# Patient Record
Sex: Male | Born: 1935
Health system: Southern US, Community
[De-identification: ages and names within clinical notes are randomized; demographics above are authoritative.]

## PROBLEM LIST (undated history)

## (undated) DIAGNOSIS — Z860101 Personal history of adenomatous and serrated colon polyps: Secondary | ICD-10-CM

## (undated) DIAGNOSIS — R339 Retention of urine, unspecified: Secondary | ICD-10-CM

## (undated) DIAGNOSIS — I2510 Atherosclerotic heart disease of native coronary artery without angina pectoris: Secondary | ICD-10-CM

## (undated) DIAGNOSIS — Z86010 Personal history of colonic polyps: Secondary | ICD-10-CM

## (undated) DIAGNOSIS — E785 Hyperlipidemia, unspecified: Secondary | ICD-10-CM

## (undated) DIAGNOSIS — R972 Elevated prostate specific antigen [PSA]: Secondary | ICD-10-CM

## (undated) DIAGNOSIS — I10 Essential (primary) hypertension: Secondary | ICD-10-CM

## (undated) DIAGNOSIS — M1990 Unspecified osteoarthritis, unspecified site: Secondary | ICD-10-CM

## (undated) DIAGNOSIS — I5030 Unspecified diastolic (congestive) heart failure: Secondary | ICD-10-CM

## (undated) DIAGNOSIS — K219 Gastro-esophageal reflux disease without esophagitis: Secondary | ICD-10-CM

## (undated) DIAGNOSIS — C61 Malignant neoplasm of prostate: Secondary | ICD-10-CM

## (undated) HISTORY — DX: Personal history of colonic polyps: Z86.010

## (undated) HISTORY — DX: Unspecified osteoarthritis, unspecified site: M19.90

## (undated) HISTORY — DX: Hyperlipidemia, unspecified: E78.5

## (undated) HISTORY — DX: Personal history of adenomatous and serrated colon polyps: Z86.0101

## (undated) HISTORY — DX: Malignant neoplasm of prostate: C61

## (undated) HISTORY — DX: Gastro-esophageal reflux disease without esophagitis: K21.9

## (undated) HISTORY — PX: CORONARY ARTERY BYPASS GRAFT: SHX141

## (undated) HISTORY — DX: Atherosclerotic heart disease of native coronary artery without angina pectoris: I25.10

## (undated) HISTORY — DX: Essential (primary) hypertension: I10

## (undated) HISTORY — DX: Unspecified diastolic (congestive) heart failure: I50.30

## (undated) HISTORY — DX: Elevated prostate specific antigen (PSA): R97.20

---

## 1994-03-02 ENCOUNTER — Encounter: Payer: Self-pay | Admitting: Internal Medicine

## 1996-12-24 ENCOUNTER — Encounter: Payer: Self-pay | Admitting: Gastroenterology

## 1997-01-08 ENCOUNTER — Encounter: Payer: Self-pay | Admitting: Internal Medicine

## 1997-01-24 ENCOUNTER — Encounter: Payer: Self-pay | Admitting: Gastroenterology

## 1999-12-24 ENCOUNTER — Encounter: Payer: Self-pay | Admitting: Gastroenterology

## 1999-12-27 ENCOUNTER — Encounter: Payer: Self-pay | Admitting: Internal Medicine

## 1999-12-27 ENCOUNTER — Encounter: Payer: Self-pay | Admitting: Gastroenterology

## 1999-12-27 ENCOUNTER — Encounter (INDEPENDENT_AMBULATORY_CARE_PROVIDER_SITE_OTHER): Payer: Self-pay | Admitting: Specialist

## 1999-12-27 ENCOUNTER — Other Ambulatory Visit: Admission: RE | Admit: 1999-12-27 | Discharge: 1999-12-27 | Payer: Self-pay | Admitting: Gastroenterology

## 2000-01-03 ENCOUNTER — Ambulatory Visit (HOSPITAL_COMMUNITY): Admission: RE | Admit: 2000-01-03 | Discharge: 2000-01-03 | Payer: Self-pay | Admitting: Gastroenterology

## 2000-01-03 ENCOUNTER — Encounter (INDEPENDENT_AMBULATORY_CARE_PROVIDER_SITE_OTHER): Payer: Self-pay | Admitting: *Deleted

## 2000-01-03 ENCOUNTER — Encounter: Payer: Self-pay | Admitting: Gastroenterology

## 2000-01-14 ENCOUNTER — Encounter: Payer: Self-pay | Admitting: Internal Medicine

## 2000-02-21 ENCOUNTER — Encounter: Payer: Self-pay | Admitting: Internal Medicine

## 2000-03-09 ENCOUNTER — Ambulatory Visit (HOSPITAL_COMMUNITY): Admission: RE | Admit: 2000-03-09 | Discharge: 2000-03-09 | Payer: Self-pay | Admitting: Internal Medicine

## 2000-03-09 ENCOUNTER — Encounter: Payer: Self-pay | Admitting: Internal Medicine

## 2000-03-09 ENCOUNTER — Encounter (INDEPENDENT_AMBULATORY_CARE_PROVIDER_SITE_OTHER): Payer: Self-pay | Admitting: Specialist

## 2000-04-19 ENCOUNTER — Ambulatory Visit (HOSPITAL_COMMUNITY): Admission: RE | Admit: 2000-04-19 | Discharge: 2000-04-19 | Payer: Self-pay | Admitting: Internal Medicine

## 2000-04-19 ENCOUNTER — Encounter: Payer: Self-pay | Admitting: Internal Medicine

## 2000-04-19 ENCOUNTER — Encounter (INDEPENDENT_AMBULATORY_CARE_PROVIDER_SITE_OTHER): Payer: Self-pay | Admitting: Specialist

## 2000-08-03 ENCOUNTER — Encounter: Payer: Self-pay | Admitting: Internal Medicine

## 2000-08-03 ENCOUNTER — Ambulatory Visit (HOSPITAL_COMMUNITY): Admission: RE | Admit: 2000-08-03 | Discharge: 2000-08-03 | Payer: Self-pay | Admitting: Internal Medicine

## 2000-08-03 ENCOUNTER — Encounter (INDEPENDENT_AMBULATORY_CARE_PROVIDER_SITE_OTHER): Payer: Self-pay | Admitting: Specialist

## 2000-10-23 ENCOUNTER — Encounter: Payer: Self-pay | Admitting: Internal Medicine

## 2000-10-23 ENCOUNTER — Ambulatory Visit (HOSPITAL_COMMUNITY): Admission: RE | Admit: 2000-10-23 | Discharge: 2000-10-23 | Payer: Self-pay | Admitting: Internal Medicine

## 2002-05-17 ENCOUNTER — Encounter: Payer: Self-pay | Admitting: Internal Medicine

## 2002-06-04 ENCOUNTER — Encounter: Payer: Self-pay | Admitting: Cardiology

## 2002-06-05 ENCOUNTER — Encounter: Payer: Self-pay | Admitting: Cardiothoracic Surgery

## 2002-06-05 ENCOUNTER — Inpatient Hospital Stay (HOSPITAL_COMMUNITY): Admission: RE | Admit: 2002-06-05 | Discharge: 2002-06-10 | Payer: Self-pay | Admitting: Cardiology

## 2002-06-06 ENCOUNTER — Encounter: Payer: Self-pay | Admitting: Cardiothoracic Surgery

## 2002-06-07 ENCOUNTER — Encounter: Payer: Self-pay | Admitting: Cardiothoracic Surgery

## 2002-06-08 ENCOUNTER — Encounter: Payer: Self-pay | Admitting: Cardiothoracic Surgery

## 2002-06-09 ENCOUNTER — Encounter (INDEPENDENT_AMBULATORY_CARE_PROVIDER_SITE_OTHER): Payer: Self-pay | Admitting: *Deleted

## 2002-07-15 ENCOUNTER — Encounter (HOSPITAL_COMMUNITY): Admission: RE | Admit: 2002-07-15 | Discharge: 2002-10-13 | Payer: Self-pay | Admitting: Cardiology

## 2002-07-23 ENCOUNTER — Encounter: Payer: Self-pay | Admitting: Internal Medicine

## 2002-08-13 ENCOUNTER — Encounter: Payer: Self-pay | Admitting: Internal Medicine

## 2002-08-13 LAB — HM COLONOSCOPY

## 2004-07-11 ENCOUNTER — Observation Stay (HOSPITAL_COMMUNITY): Admission: EM | Admit: 2004-07-11 | Discharge: 2004-07-12 | Payer: Self-pay | Admitting: Emergency Medicine

## 2004-08-31 ENCOUNTER — Ambulatory Visit: Payer: Self-pay | Admitting: Internal Medicine

## 2004-09-01 ENCOUNTER — Ambulatory Visit: Payer: Self-pay | Admitting: Internal Medicine

## 2004-09-21 ENCOUNTER — Ambulatory Visit: Payer: Self-pay | Admitting: Internal Medicine

## 2004-10-12 ENCOUNTER — Ambulatory Visit: Payer: Self-pay | Admitting: Internal Medicine

## 2005-01-11 ENCOUNTER — Ambulatory Visit: Payer: Self-pay | Admitting: Internal Medicine

## 2005-06-23 ENCOUNTER — Ambulatory Visit: Payer: Self-pay | Admitting: Internal Medicine

## 2005-07-06 ENCOUNTER — Emergency Department (HOSPITAL_COMMUNITY): Admission: EM | Admit: 2005-07-06 | Discharge: 2005-07-06 | Payer: Self-pay | Admitting: Emergency Medicine

## 2005-07-12 ENCOUNTER — Ambulatory Visit: Payer: Self-pay | Admitting: Internal Medicine

## 2005-07-13 ENCOUNTER — Encounter: Admission: RE | Admit: 2005-07-13 | Discharge: 2005-07-13 | Payer: Self-pay | Admitting: Internal Medicine

## 2005-07-19 ENCOUNTER — Ambulatory Visit: Payer: Self-pay

## 2005-07-26 ENCOUNTER — Ambulatory Visit: Payer: Self-pay | Admitting: Cardiology

## 2005-08-02 ENCOUNTER — Ambulatory Visit: Payer: Self-pay | Admitting: Internal Medicine

## 2005-08-05 ENCOUNTER — Ambulatory Visit: Payer: Self-pay | Admitting: Internal Medicine

## 2005-10-04 ENCOUNTER — Ambulatory Visit: Payer: Self-pay | Admitting: Internal Medicine

## 2005-11-30 ENCOUNTER — Ambulatory Visit: Payer: Self-pay | Admitting: Internal Medicine

## 2005-12-07 ENCOUNTER — Ambulatory Visit: Payer: Self-pay | Admitting: Internal Medicine

## 2006-03-07 ENCOUNTER — Ambulatory Visit: Payer: Self-pay | Admitting: Internal Medicine

## 2006-04-07 ENCOUNTER — Emergency Department (HOSPITAL_COMMUNITY): Admission: EM | Admit: 2006-04-07 | Discharge: 2006-04-07 | Payer: Self-pay | Admitting: Emergency Medicine

## 2006-07-26 ENCOUNTER — Ambulatory Visit: Payer: Self-pay | Admitting: Cardiology

## 2006-08-02 ENCOUNTER — Ambulatory Visit: Payer: Self-pay | Admitting: Cardiology

## 2006-08-29 ENCOUNTER — Ambulatory Visit: Payer: Self-pay | Admitting: Internal Medicine

## 2006-08-29 LAB — CONVERTED CEMR LAB: Hgb A1c MFr Bld: 7.9 % — ABNORMAL HIGH (ref 4.6–6.0)

## 2006-11-28 ENCOUNTER — Ambulatory Visit: Payer: Self-pay | Admitting: Internal Medicine

## 2007-01-25 ENCOUNTER — Emergency Department (HOSPITAL_COMMUNITY): Admission: EM | Admit: 2007-01-25 | Discharge: 2007-01-26 | Payer: Self-pay | Admitting: Emergency Medicine

## 2007-03-13 ENCOUNTER — Emergency Department (HOSPITAL_COMMUNITY): Admission: EM | Admit: 2007-03-13 | Discharge: 2007-03-14 | Payer: Self-pay | Admitting: Emergency Medicine

## 2007-06-04 DIAGNOSIS — E785 Hyperlipidemia, unspecified: Secondary | ICD-10-CM

## 2007-06-04 DIAGNOSIS — E1159 Type 2 diabetes mellitus with other circulatory complications: Secondary | ICD-10-CM | POA: Insufficient documentation

## 2007-06-04 DIAGNOSIS — E119 Type 2 diabetes mellitus without complications: Secondary | ICD-10-CM | POA: Insufficient documentation

## 2007-06-04 DIAGNOSIS — E1169 Type 2 diabetes mellitus with other specified complication: Secondary | ICD-10-CM | POA: Insufficient documentation

## 2007-06-04 DIAGNOSIS — I10 Essential (primary) hypertension: Secondary | ICD-10-CM

## 2007-06-04 DIAGNOSIS — I2510 Atherosclerotic heart disease of native coronary artery without angina pectoris: Secondary | ICD-10-CM | POA: Insufficient documentation

## 2007-06-05 ENCOUNTER — Ambulatory Visit: Payer: Self-pay | Admitting: Internal Medicine

## 2007-06-05 LAB — CONVERTED CEMR LAB
ALT: 19 units/L (ref 0–53)
AST: 16 units/L (ref 0–37)
Albumin: 3.6 g/dL (ref 3.5–5.2)
Alkaline Phosphatase: 38 units/L — ABNORMAL LOW (ref 39–117)
BUN: 19 mg/dL (ref 6–23)
Basophils Absolute: 0 10*3/uL (ref 0.0–0.1)
Basophils Relative: 0.4 % (ref 0.0–1.0)
Bilirubin Urine: NEGATIVE
Bilirubin, Direct: 0.1 mg/dL (ref 0.0–0.3)
Blood in Urine, dipstick: NEGATIVE
CO2: 30 meq/L (ref 19–32)
Calcium: 9 mg/dL (ref 8.4–10.5)
Chloride: 109 meq/L (ref 96–112)
Cholesterol: 130 mg/dL (ref 0–200)
Creatinine, Ser: 0.9 mg/dL (ref 0.4–1.5)
Creatinine,U: 99.6 mg/dL
Eosinophils Absolute: 0.2 10*3/uL (ref 0.0–0.6)
Eosinophils Relative: 2.3 % (ref 0.0–5.0)
GFR calc Af Amer: 103 mL/min
GFR calc non Af Amer: 85 mL/min
Glucose, Bld: 134 mg/dL — ABNORMAL HIGH (ref 70–99)
Glucose, Urine, Semiquant: NEGATIVE
HCT: 38.2 % — ABNORMAL LOW (ref 39.0–52.0)
HDL: 40.4 mg/dL (ref >39.0)
Hemoglobin: 13.2 g/dL (ref 13.0–17.0)
Hgb A1c MFr Bld: 7.4 % — ABNORMAL HIGH (ref 4.6–6.0)
Ketones, urine, test strip: NEGATIVE
LDL Cholesterol: 72 mg/dL (ref 0–99)
Lymphocytes Relative: 35.5 % (ref 12.0–46.0)
MCHC: 34.5 g/dL (ref 30.0–36.0)
MCV: 91.4 fL (ref 78.0–100.0)
Microalb Creat Ratio: 7 mg/g (ref 0.0–30.0)
Microalb, Ur: 0.7 mg/dL (ref 0.0–1.9)
Monocytes Absolute: 0.5 10*3/uL (ref 0.2–0.7)
Monocytes Relative: 7.5 % (ref 3.0–11.0)
Neutro Abs: 3.8 10*3/uL (ref 1.4–7.7)
Neutrophils Relative %: 54.3 % (ref 43.0–77.0)
Nitrite: NEGATIVE
Platelets: 311 10*3/uL (ref 150–400)
Potassium: 4.3 meq/L (ref 3.5–5.1)
Protein, U semiquant: NEGATIVE
RBC: 4.18 M/uL — ABNORMAL LOW (ref 4.22–5.81)
RDW: 13 % (ref 11.5–14.6)
Sodium: 144 meq/L (ref 135–145)
Specific Gravity, Urine: 1.02
TSH: 1.23 microintl units/mL (ref 0.35–5.50)
Total Bilirubin: 0.8 mg/dL (ref 0.3–1.2)
Total CHOL/HDL Ratio: 3.2
Total Protein: 6.6 g/dL (ref 6.0–8.3)
Triglycerides: 89 mg/dL (ref 0–149)
Urobilinogen, UA: 0.2
VLDL: 18 mg/dL (ref 0–40)
WBC Urine, dipstick: NEGATIVE
WBC: 7 10*3/uL (ref 4.5–10.5)
pH: 6

## 2007-06-12 ENCOUNTER — Ambulatory Visit: Payer: Self-pay | Admitting: Internal Medicine

## 2007-07-27 ENCOUNTER — Ambulatory Visit: Payer: Self-pay | Admitting: Internal Medicine

## 2007-08-08 ENCOUNTER — Ambulatory Visit: Payer: Self-pay | Admitting: Internal Medicine

## 2007-08-08 LAB — CONVERTED CEMR LAB: Hgb A1c MFr Bld: 7.4 % — ABNORMAL HIGH (ref 4.6–6.0)

## 2007-08-15 ENCOUNTER — Ambulatory Visit: Payer: Self-pay | Admitting: Internal Medicine

## 2007-08-28 ENCOUNTER — Ambulatory Visit: Payer: Self-pay | Admitting: Cardiology

## 2007-11-14 ENCOUNTER — Ambulatory Visit: Payer: Self-pay | Admitting: Internal Medicine

## 2007-11-14 LAB — CONVERTED CEMR LAB
BUN: 19 mg/dL (ref 6–23)
CO2: 28 meq/L (ref 19–32)
Calcium: 9.8 mg/dL (ref 8.4–10.5)
Chloride: 105 meq/L (ref 96–112)
Creatinine, Ser: 1.2 mg/dL (ref 0.4–1.5)
GFR calc Af Amer: 74 mL/min
GFR calc non Af Amer: 61 mL/min
Glucose, Bld: 103 mg/dL — ABNORMAL HIGH (ref 70–99)
Hgb A1c MFr Bld: 7.8 % — ABNORMAL HIGH (ref 4.6–6.0)
Potassium: 5.1 meq/L (ref 3.5–5.1)
Sodium: 140 meq/L (ref 135–145)

## 2007-11-29 ENCOUNTER — Encounter (INDEPENDENT_AMBULATORY_CARE_PROVIDER_SITE_OTHER): Payer: Self-pay | Admitting: Orthopedic Surgery

## 2007-11-29 ENCOUNTER — Ambulatory Visit (HOSPITAL_COMMUNITY): Admission: RE | Admit: 2007-11-29 | Discharge: 2007-11-29 | Payer: Self-pay | Admitting: Orthopedic Surgery

## 2007-12-04 ENCOUNTER — Encounter: Payer: Self-pay | Admitting: Internal Medicine

## 2008-02-13 ENCOUNTER — Ambulatory Visit: Payer: Self-pay | Admitting: Internal Medicine

## 2008-02-13 DIAGNOSIS — K449 Diaphragmatic hernia without obstruction or gangrene: Secondary | ICD-10-CM | POA: Insufficient documentation

## 2008-02-13 DIAGNOSIS — M5030 Other cervical disc degeneration, unspecified cervical region: Secondary | ICD-10-CM | POA: Insufficient documentation

## 2008-02-21 ENCOUNTER — Encounter: Payer: Self-pay | Admitting: Internal Medicine

## 2008-03-04 ENCOUNTER — Encounter: Payer: Self-pay | Admitting: Internal Medicine

## 2008-03-20 ENCOUNTER — Telehealth (INDEPENDENT_AMBULATORY_CARE_PROVIDER_SITE_OTHER): Payer: Self-pay | Admitting: *Deleted

## 2008-07-14 ENCOUNTER — Ambulatory Visit (HOSPITAL_COMMUNITY): Admission: RE | Admit: 2008-07-14 | Discharge: 2008-07-14 | Payer: Self-pay | Admitting: Urology

## 2008-08-21 ENCOUNTER — Ambulatory Visit: Payer: Self-pay | Admitting: Internal Medicine

## 2008-08-21 DIAGNOSIS — C61 Malignant neoplasm of prostate: Secondary | ICD-10-CM | POA: Insufficient documentation

## 2008-08-21 DIAGNOSIS — K429 Umbilical hernia without obstruction or gangrene: Secondary | ICD-10-CM | POA: Insufficient documentation

## 2008-08-21 LAB — CONVERTED CEMR LAB
BUN: 25 mg/dL — ABNORMAL HIGH (ref 6–23)
CO2: 29 meq/L (ref 19–32)
Calcium: 9.5 mg/dL (ref 8.4–10.5)
Chloride: 106 meq/L (ref 96–112)
Cholesterol: 133 mg/dL (ref 0–200)
Creatinine, Ser: 1.1 mg/dL (ref 0.4–1.5)
GFR calc Af Amer: 82 mL/min
GFR calc non Af Amer: 68 mL/min
Glucose, Bld: 135 mg/dL — ABNORMAL HIGH (ref 70–99)
HDL: 43.7 mg/dL (ref >39.0)
Hgb A1c MFr Bld: 8 % — ABNORMAL HIGH (ref 4.6–6.0)
LDL Cholesterol: 62 mg/dL (ref 0–99)
Potassium: 4.8 meq/L (ref 3.5–5.1)
Sodium: 142 meq/L (ref 135–145)
Total CHOL/HDL Ratio: 3
Triglycerides: 136 mg/dL (ref 0–149)
VLDL: 27 mg/dL (ref 0–40)

## 2008-08-28 ENCOUNTER — Ambulatory Visit: Payer: Self-pay | Admitting: Cardiology

## 2008-10-14 ENCOUNTER — Telehealth: Payer: Self-pay | Admitting: Internal Medicine

## 2009-03-05 ENCOUNTER — Telehealth: Payer: Self-pay | Admitting: Internal Medicine

## 2009-05-12 ENCOUNTER — Ambulatory Visit: Payer: Self-pay | Admitting: Internal Medicine

## 2009-05-12 LAB — CONVERTED CEMR LAB
Basophils Absolute: 0 10*3/uL (ref 0.0–0.1)
Basophils Relative: 0.3 % (ref 0.0–3.0)
Cholesterol: 128 mg/dL (ref 0–200)
Direct LDL: 71.9 mg/dL
Eosinophils Absolute: 0.1 10*3/uL (ref 0.0–0.7)
Eosinophils Relative: 1.6 % (ref 0.0–5.0)
HCT: 38.9 % — ABNORMAL LOW (ref 39.0–52.0)
HDL: 42.7 mg/dL (ref >39.00)
Hemoglobin: 13.3 g/dL (ref 13.0–17.0)
Hgb A1c MFr Bld: 7.6 % — ABNORMAL HIGH (ref 4.6–6.5)
Lymphocytes Relative: 24.6 % (ref 12.0–46.0)
Lymphs Abs: 1.6 10*3/uL (ref 0.7–4.0)
MCHC: 34.1 g/dL (ref 30.0–36.0)
MCV: 95.8 fL (ref 78.0–100.0)
Monocytes Absolute: 0.6 10*3/uL (ref 0.1–1.0)
Monocytes Relative: 8.6 % (ref 3.0–12.0)
Neutro Abs: 4.4 10*3/uL (ref 1.4–7.7)
Neutrophils Relative %: 64.9 % (ref 43.0–77.0)
Platelets: 276 10*3/uL (ref 150.0–400.0)
RBC: 4.06 M/uL — ABNORMAL LOW (ref 4.22–5.81)
RDW: 12.9 % (ref 11.5–14.6)
WBC: 6.7 10*3/uL (ref 4.5–10.5)

## 2009-05-28 ENCOUNTER — Telehealth: Payer: Self-pay | Admitting: *Deleted

## 2009-07-30 ENCOUNTER — Encounter: Payer: Self-pay | Admitting: Internal Medicine

## 2009-08-04 ENCOUNTER — Ambulatory Visit: Payer: Self-pay | Admitting: Internal Medicine

## 2009-08-04 DIAGNOSIS — K3189 Other diseases of stomach and duodenum: Secondary | ICD-10-CM | POA: Insufficient documentation

## 2009-08-04 DIAGNOSIS — K219 Gastro-esophageal reflux disease without esophagitis: Secondary | ICD-10-CM | POA: Insufficient documentation

## 2009-08-04 DIAGNOSIS — R1013 Epigastric pain: Secondary | ICD-10-CM

## 2009-08-20 ENCOUNTER — Ambulatory Visit: Payer: Self-pay | Admitting: Internal Medicine

## 2009-09-01 ENCOUNTER — Ambulatory Visit: Payer: Self-pay | Admitting: Cardiology

## 2009-09-01 DIAGNOSIS — R079 Chest pain, unspecified: Secondary | ICD-10-CM | POA: Insufficient documentation

## 2009-09-15 ENCOUNTER — Ambulatory Visit: Payer: Self-pay | Admitting: Internal Medicine

## 2009-09-15 LAB — CONVERTED CEMR LAB
ALT: 10 units/L (ref 0–53)
AST: 15 units/L (ref 0–37)
Albumin: 4 g/dL (ref 3.5–5.2)
Alkaline Phosphatase: 39 units/L (ref 39–117)
BUN: 16 mg/dL (ref 6–23)
Bilirubin, Direct: 0 mg/dL (ref 0.0–0.3)
CO2: 29 meq/L (ref 19–32)
Calcium: 9.4 mg/dL (ref 8.4–10.5)
Chloride: 109 meq/L (ref 96–112)
Cholesterol: 155 mg/dL (ref 0–200)
Creatinine, Ser: 1 mg/dL (ref 0.4–1.5)
GFR calc non Af Amer: 90.93 mL/min (ref >60)
Glucose, Bld: 122 mg/dL — ABNORMAL HIGH (ref 70–99)
HDL: 48.4 mg/dL (ref >39.00)
Hgb A1c MFr Bld: 7.5 % — ABNORMAL HIGH (ref 4.6–6.5)
LDL Cholesterol: 89 mg/dL (ref 0–99)
Potassium: 5 meq/L (ref 3.5–5.1)
Sodium: 143 meq/L (ref 135–145)
TSH: 1.45 microintl units/mL (ref 0.35–5.50)
Total Bilirubin: 1 mg/dL (ref 0.3–1.2)
Total CHOL/HDL Ratio: 3
Total Protein: 7.2 g/dL (ref 6.0–8.3)
Triglycerides: 89 mg/dL (ref 0.0–149.0)
VLDL: 17.8 mg/dL (ref 0.0–40.0)

## 2010-01-14 ENCOUNTER — Ambulatory Visit: Payer: Self-pay | Admitting: Internal Medicine

## 2010-01-14 LAB — CONVERTED CEMR LAB
BUN: 28 mg/dL — ABNORMAL HIGH (ref 6–23)
Basophils Absolute: 0 10*3/uL (ref 0.0–0.1)
Basophils Relative: 0.2 % (ref 0.0–3.0)
CO2: 28 meq/L (ref 19–32)
Calcium: 8.9 mg/dL (ref 8.4–10.5)
Chloride: 104 meq/L (ref 96–112)
Cholesterol, target level: 200 mg/dL
Creatinine, Ser: 1.2 mg/dL (ref 0.4–1.5)
Eosinophils Absolute: 0.1 10*3/uL (ref 0.0–0.7)
Eosinophils Relative: 2 % (ref 0.0–5.0)
GFR calc non Af Amer: 73.62 mL/min (ref >60)
Glucose, Bld: 202 mg/dL — ABNORMAL HIGH (ref 70–99)
HCT: 35.5 % — ABNORMAL LOW (ref 39.0–52.0)
HDL goal, serum: 40 mg/dL
Hemoglobin: 11.8 g/dL — ABNORMAL LOW (ref 13.0–17.0)
Hgb A1c MFr Bld: 7.3 % — ABNORMAL HIGH (ref 4.6–6.5)
LDL Goal: 70 mg/dL
Lymphocytes Relative: 25.6 % (ref 12.0–46.0)
Lymphs Abs: 1.6 10*3/uL (ref 0.7–4.0)
MCHC: 33.3 g/dL (ref 30.0–36.0)
MCV: 98.7 fL (ref 78.0–100.0)
Monocytes Absolute: 0.6 10*3/uL (ref 0.1–1.0)
Monocytes Relative: 8.8 % (ref 3.0–12.0)
Neutro Abs: 4 10*3/uL (ref 1.4–7.7)
Neutrophils Relative %: 63.4 % (ref 43.0–77.0)
Platelets: 280 10*3/uL (ref 150.0–400.0)
Potassium: 4.2 meq/L (ref 3.5–5.1)
RBC: 3.6 M/uL — ABNORMAL LOW (ref 4.22–5.81)
RDW: 12.5 % (ref 11.5–14.6)
Sodium: 139 meq/L (ref 135–145)
WBC: 6.3 10*3/uL (ref 4.5–10.5)

## 2010-02-17 ENCOUNTER — Encounter: Payer: Self-pay | Admitting: Internal Medicine

## 2010-03-09 ENCOUNTER — Telehealth: Payer: Self-pay | Admitting: Internal Medicine

## 2010-03-10 ENCOUNTER — Encounter: Payer: Self-pay | Admitting: Internal Medicine

## 2010-06-16 ENCOUNTER — Ambulatory Visit: Payer: Self-pay | Admitting: Internal Medicine

## 2010-06-16 ENCOUNTER — Telehealth: Payer: Self-pay | Admitting: Internal Medicine

## 2010-06-16 DIAGNOSIS — K5900 Constipation, unspecified: Secondary | ICD-10-CM | POA: Insufficient documentation

## 2010-07-29 ENCOUNTER — Ambulatory Visit: Payer: Self-pay | Admitting: Internal Medicine

## 2010-07-29 LAB — CONVERTED CEMR LAB
ALT: 13 units/L (ref 0–53)
AST: 16 units/L (ref 0–37)
Albumin: 3.7 g/dL (ref 3.5–5.2)
Alkaline Phosphatase: 44 units/L (ref 39–117)
BUN: 29 mg/dL — ABNORMAL HIGH (ref 6–23)
Basophils Absolute: 0 10*3/uL (ref 0.0–0.1)
Basophils Relative: 0.6 % (ref 0.0–3.0)
Bilirubin, Direct: 0.1 mg/dL (ref 0.0–0.3)
CO2: 28 meq/L (ref 19–32)
Calcium: 9.6 mg/dL (ref 8.4–10.5)
Chloride: 104 meq/L (ref 96–112)
Cholesterol: 123 mg/dL (ref 0–200)
Creatinine, Ser: 1.2 mg/dL (ref 0.4–1.5)
Eosinophils Absolute: 0.1 10*3/uL (ref 0.0–0.7)
Eosinophils Relative: 2.2 % (ref 0.0–5.0)
GFR calc non Af Amer: 76.46 mL/min (ref >60)
Glucose, Bld: 102 mg/dL — ABNORMAL HIGH (ref 70–99)
HCT: 34.8 % — ABNORMAL LOW (ref 39.0–52.0)
HDL: 40.4 mg/dL (ref >39.00)
Hemoglobin: 11.5 g/dL — ABNORMAL LOW (ref 13.0–17.0)
LDL Cholesterol: 61 mg/dL (ref 0–99)
Lymphocytes Relative: 27 % (ref 12.0–46.0)
Lymphs Abs: 1.8 10*3/uL (ref 0.7–4.0)
MCHC: 33.1 g/dL (ref 30.0–36.0)
MCV: 89.7 fL (ref 78.0–100.0)
Monocytes Absolute: 0.6 10*3/uL (ref 0.1–1.0)
Monocytes Relative: 9.4 % (ref 3.0–12.0)
Neutro Abs: 4.1 10*3/uL (ref 1.4–7.7)
Neutrophils Relative %: 60.8 % (ref 43.0–77.0)
PSA: 8.14 ng/mL — ABNORMAL HIGH (ref 0.10–4.00)
Platelets: 324 10*3/uL (ref 150.0–400.0)
Potassium: 4.9 meq/L (ref 3.5–5.1)
RBC: 3.88 M/uL — ABNORMAL LOW (ref 4.22–5.81)
RDW: 14.9 % — ABNORMAL HIGH (ref 11.5–14.6)
Sodium: 138 meq/L (ref 135–145)
TSH: 0.97 microintl units/mL (ref 0.35–5.50)
Total Bilirubin: 0.7 mg/dL (ref 0.3–1.2)
Total CHOL/HDL Ratio: 3
Total Protein: 6.7 g/dL (ref 6.0–8.3)
Triglycerides: 109 mg/dL (ref 0.0–149.0)
VLDL: 21.8 mg/dL (ref 0.0–40.0)
WBC: 6.7 10*3/uL (ref 4.5–10.5)

## 2010-07-29 LAB — HM DIABETES FOOT EXAM

## 2010-10-13 ENCOUNTER — Telehealth: Payer: Self-pay | Admitting: Internal Medicine

## 2010-11-22 ENCOUNTER — Ambulatory Visit: Admit: 2010-11-22 | Payer: Self-pay | Admitting: Cardiology

## 2010-11-25 NOTE — Progress Notes (Signed)
Summary: [facility]   Imported By: Sherian Rein 08/05/2009 07:39:17  _____________________________________________________________________  External Attachment:    Type:   Image     Comment:   External Document

## 2010-11-25 NOTE — Progress Notes (Signed)
Summary: [facility]  GI Office Note/[facility]   Imported By: Sherian Rein 08/05/2009 07:54:13  _____________________________________________________________________  External Attachment:    Type:   Image     Comment:   External Document

## 2010-11-25 NOTE — Assessment & Plan Note (Signed)
Summary: 3 month f/u//ca   Vital Signs:  Patient Profile:   77 Years Old Male Height:     70 inches Weight:      206 pounds Temp:     98.2 degrees F oral Pulse rate:   14 / minute Resp:     14 per minute BP sitting:   120 / 72  (left arm)  Vitals Entered By: Willy Eddy, LPN (February 13, 2008 9:30 AM)                 Chief Complaint:  roa.  History of Present Illness: Current Problems:  Blood pressure follow up and review of medicatons DIABETES MELLITUS, TYPE II (ICD-250.00)  stable with good cbgs CORONARY ARTERY DISEASE (ICD-414.00) PSA, INCREASED (ICD-790.93) HYPERTENSION (ICD-401.9) stable HYPERLIPIDEMIA (ICD-272.4)    Diabetes Management History:      The patient is an 77 years old male who comes in for evaluation of DM Type 2.  He has not been enrolled in the "Diabetic Education Program".  He states understanding of dietary principles and is following his diet appropriately.  Sensory loss is noted.  Self foot exams are being performed.  He is checking home blood sugars.  He says that he is not exercising regularly.        Hypoglycemic symptoms are not occurring.  No hyperglycemic symptoms are reported.        His home fasting blood sugars are as follows: average: 110.    Hypertension History:      He denies headache, chest pain, palpitations, dyspnea with exertion, orthopnea, PND, peripheral edema, visual symptoms, neurologic problems, syncope, and side effects from treatment.        Positive major cardiovascular risk factors include male age 19 years old or older, diabetes, hyperlipidemia, and hypertension.  Negative major cardiovascular risk factors include non-tobacco-user status.        Positive history for target organ damage include ASHD (either angina; prior MI; prior CABG).        Current Allergies: No known allergies   Past Medical History:    Reviewed history from 06/04/2007 and no changes required:       Hyperlipidemia       Hypertension   elevated PSA, hx of       Coronary artery disease       Diabetes mellitus, type II   Social History:    Reviewed history from 06/12/2007 and no changes required:       Retired       Married       Former Smoker    Review of Systems  The patient denies anorexia, fever, weight loss, weight gain, vision loss, decreased hearing, hoarseness, chest pain, syncope, dyspnea on exhertion, peripheral edema, prolonged cough, hemoptysis, abdominal pain, melena, hematochezia, severe indigestion/heartburn, hematuria, incontinence, genital sores, muscle weakness, suspicious skin lesions, transient blindness, difficulty walking, depression, unusual weight change, abnormal bleeding, enlarged lymph nodes, angioedema, breast masses, and testicular masses.     Physical Exam  General:     Well-developed,well-nourished,in no acute distress; alert,appropriate and cooperative throughout examination Head:     male-pattern balding.   Ears:     R ear normal and L ear normal.   Nose:     no external deformity.  no nasal discharge.   Mouth:     pharynx pink and moist.   Neck:     supple and decreased ROM.   Lungs:     Normal respiratory effort, chest expands symmetrically.  Lungs are clear to auscultation, no crackles or wheezes. Heart:     normal rate and regular rhythm.   Abdomen:     soft, non-tender, and normal bowel sounds.   Msk:     lumbar lordosis and SI joint tenderness.   Neurologic:     No cranial nerve deficits noted. Station and gait are normal. Plantar reflexes are down-going bilaterally. DTRs are symmetrical throughout. Sensory, motor and coordinative functions appear intact.    Impression & Recommendations:  Problem # 1:  DIABETES MELLITUS, TYPE II (ICD-250.00)  His updated medication list for this problem includes:    Benazepril-hydrochlorothiazide 20-12.5 Mg Tabs (Benazepril-hydrochlorothiazide) .Marland Kitchen..Marland Kitchen Two times a day    Glucovance 5-500 Mg Tabs (Glyburide-metformin) .Marland Kitchen..Marland Kitchen Two  times a day    Ecotrin 325 Mg Tbec (Aspirin) ..... Once daily  Labs Reviewed: HgBA1c: 7.8 (11/14/2007)   Creat: 1.2 (11/14/2007)      Problem # 2:  DEGENERATIVE DISC DISEASE, CERVICAL SPINE (ICD-722.4) with numbness in the right hand due to compression  Problem # 3:  HIATAL HERNIA WITH REFLUX (ICD-553.3) convert to generic prilosec for cost savings and controll of hiatla herni discussion of gerd precautions and diet  Problem # 4:  PSA, INCREASED (ICD-790.93) stable seing urology  Problem # 5:  HYPERTENSION (ICD-401.9)  His updated medication list for this problem includes:    Benazepril-hydrochlorothiazide 20-12.5 Mg Tabs (Benazepril-hydrochlorothiazide) .Marland Kitchen..Marland Kitchen Two times a day  BP today: 120/72 Prior BP: 120/70 (11/14/2007)  Prior 10 Yr Risk Heart Disease: N/A (08/15/2007)  Labs Reviewed: Creat: 1.2 (11/14/2007) Chol: 130 (06/05/2007)   HDL: 40.4 (06/05/2007)   LDL: 72 (06/05/2007)   TG: 89 (06/05/2007)   Complete Medication List: 1)  Benazepril-hydrochlorothiazide 20-12.5 Mg Tabs (Benazepril-hydrochlorothiazide) .... Two times a day 2)  Glucovance 5-500 Mg Tabs (Glyburide-metformin) .... Two times a day 3)  Nexium 40 Mg Cpdr (Esomeprazole magnesium) .... As needed 4)  Lovastatin 20 Mg Tabs (Lovastatin) .... Take 1 tablet by mouth once a day 5)  Ecotrin 325 Mg Tbec (Aspirin) .... Once daily 6)  Fish Oil Concentrate 1000 Mg Caps (Omega-3 fatty acids) .... One by mouth bid 7)  Vitamin C 500 Mg Chew (Ascorbic acid) .... Once daily 8)  Prilosec 20 Mg Cpdr (Omeprazole) .... One by mouth daily  Diabetes Management Assessment/Plan:      The following lipid goals have been established for the patient: Total cholesterol goal of 200; LDL cholesterol goal of 100; HDL cholesterol goal of 40; Triglyceride goal of 200.  His blood pressure goal is < 130/80.    Hypertension Assessment/Plan:      The patient's hypertensive risk group is category C: Target organ damage and/or diabetes.   Today's blood pressure is 120/72.  His blood pressure goal is < 130/80.   Patient Instructions: 1)  Please schedule a follow-up appointment in 6 months.    Prescriptions: PRILOSEC 20 MG  CPDR (OMEPRAZOLE) one by mouth daily  #90 x 3   Entered and Authorized by:   Stacie Glaze MD   Signed by:   Stacie Glaze MD on 02/13/2008   Method used:   Print then Give to Patient   RxID:   [id number]  ]

## 2010-11-25 NOTE — Progress Notes (Signed)
Summary: abd xrays  Phone Note Call from Patient   Caller: Patient Call For: Stacie Glaze MD Reason for Call: Acute Illness, Referral Summary of Call: [phone] [facility] ([address]) No BM x 4 days.  Has used Miralax, and Fleets with no relief.  No pain.  Initial call taken by: Lynann Beaver CMA,  June 16, 2010 8:49 AM  Follow-up for Phone Call        per dr Lovell Sheehan- abd series xrays to make sure no obstruction Follow-up by: Willy Eddy, LPN,  June 16, 2010 9:01 AM  Additional Follow-up for Phone Call Additional follow up Details #1::        No answer and no voice mail.  Will try later.  No answer and no voice mail. Lynann Beaver CMA  June 16, 2010 10:02 AM [facility] on home phone.  [phone] Message says not available.  LMTCB H#. Rudy Jew, RN  June 16, 2010 1:23 PM  Additional Follow-up by: Lynann Beaver CMA,  June 16, 2010 9:24 AM  New Problems: CONSTIPATION (ICD-564.00)   Additional Follow-up for Phone Call Additional follow up Details #2::    Patient aware & going now to [facility]. Follow-up by: Rudy Jew, RN,  June 16, 2010 3:21 PM  Additional Follow-up for Phone Call Additional follow up Details #3:: Details for Additional Follow-up Action Taken: neg abd series- give mag citrate per dr Haskell Flirt informed Additional Follow-up by: Willy Eddy, LPN,  June 16, 2010 5:08 PM  New Problems: CONSTIPATION (ICD-564.00)   Immunization History:  Influenza Immunization History:    Influenza:  historical (06/14/2010)

## 2010-11-25 NOTE — Consult Note (Signed)
Summary: Dr Madelon Lips note  Dr Madelon Lips note   Imported By: Kassie Mends 03/25/2008 12:42:43  _____________________________________________________________________  External Attachment:    Type:   Image     Comment:   Dr Madelon Lips note

## 2010-11-25 NOTE — Procedures (Signed)
Summary: [employer]   Imported By: Sherian Rein 08/05/2009 08:07:48  _____________________________________________________________________  External Attachment:    Type:   Image     Comment:   External Document

## 2010-11-25 NOTE — Procedures (Signed)
Summary: EGD 08/13/2002   EGD  Procedure date:  08/23/2002  Findings:      Location: [facility]  GERD Angiodysplasia Patient Name: [name], [name] MRN:  Procedure Procedures: Panendoscopy (EGD) CPT: 43235.  Personnel: Endoscopist: Wilhemina Bonito. Marina Goodell, MD.  Exam Location: Exam performed in Outpatient Clinic. Outpatient  Patient Consent: Procedure, Alternatives, Risks and Benefits discussed, consent obtained, from patient. Consent was obtained by the RN.  Indications  Surveillance of: Duodenal adenoma.  History  Pre-Exam Physical: Performed Aug 13, 2002  Entire physical exam was normal.  Exam Exam Info: Maximum depth of insertion Duodenum, intended Duodenum. Patient position: on left side. Vocal cords visualized. Gastric retroflexion performed. Images taken. ASA Classification: III. Tolerance: excellent.  Sedation Meds: Patient assessed and found to be appropriate for moderate (conscious) sedation. Residual sedation present from prior procedure today. Versed 3 mg. given IV.  Monitoring: BP and pulse monitoring done. Oximetry used. Supplemental O2 given  Findings MUCOSAL ABNORMALITY: Body. Thin (atrophic) folds. Comment: Atrophic gastritis.  OTHER FINDING: Scar only. No recurrent adenoma. in Duodenal Apex.  ANGIODYSPLASIA (AVMs): Total of 1 AVMs,  maximum size 4 mm, non- bleeding, in Duodenal Apex. ICD9: Angiodysplasia without Hemorrhage: 569.84.   Assessment Abnormal examination, see findings above.  Diagnoses: 569.84: Angiodysplasia without Hemorrhage.  530.81: GERD.   Events  Unplanned Intervention: No unplanned interventions were required.  Unplanned Events: There were no complications. Plans Disposition: After procedure patient sent to recovery. After recovery patient sent home.  Scheduling: Follow-up prn.   This report was created from the original endoscopy report, which was reviewed and signed by the above listed endoscopist.    cc: Darryll Capers, MD     Charlies Constable, MD

## 2010-11-25 NOTE — Progress Notes (Signed)
Summary: GI Dietitian  GI Office Note/[facility]   Imported By: Sherian Rein 08/05/2009 07:57:26  _____________________________________________________________________  External Attachment:    Type:   Image     Comment:   External Document

## 2010-11-25 NOTE — Assessment & Plan Note (Signed)
Summary: per check out  Medications Added BENAZEPRIL-HYDROCHLOROTHIAZIDE 20-12.5 MG TABS (BENAZEPRIL-HYDROCHLOROTHIAZIDE) two times a day VITAMIN E 1000 UNIT CAPS (VITAMIN E) Take 1 capsule by mouth once a day VITAMIN B-12 500 MCG TABS (CYANOCOBALAMIN) Take 1 tablet by mouth once a day      Allergies Added: NKDA  Visit Type:  Follow-up Referring Provider:  n/a Primary Provider:  Peri Jefferson  CC:  Acid reflux-- Chest discomfort sometimes.  History of Present Illness: The patient is 77 years old and returns for a followup visit after a year for management of CAD. 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 His good left ventricular function. He has done quite well with his heart with no chest pain shortness breath or palpitations.  He has had some right-sided chest pain related to reaching and he wondered if something was wrong with his surgical incisions. He feels like something is pulling there.  His other problems include hypertension hyperlipidemia and diabetes. He also has prostate cancer.  Problems Prior to Update: 1)  Chest Pain Unspecified  (ICD-786.50) 2)  Gerd  (ICD-530.81) 3)  Dyspepsia  (ICD-536.8) 4)  Hernia, Umbilical  (ICD-553.1) 5)  Hiatal Hernia With Reflux  (ICD-553.3) 6)  Degenerative Disc Disease, Cervical Spine  (ICD-722.4) 7)  Diabetes Mellitus, Type II  (ICD-250.00) 8)  Coronary Artery Disease  (ICD-414.00) 9)  Neoplasm, Malignant, Prostate  (ICD-185) 10)  Hypertension  (ICD-401.9) 11)  Hyperlipidemia  (ICD-272.4)  Current Medications (verified): 1)  Benazepril-Hydrochlorothiazide 20-12.5 Mg Tabs (Benazepril-Hydrochlorothiazide) .... Two Times A Day 2)  Glucovance 5-500 Mg Tabs (Glyburide-Metformin) .... Two Times A Day 3)  Nexium 40 Mg Cpdr (Esomeprazole Magnesium) .... As Needed 4)  Lovastatin 20 Mg  Tabs (Lovastatin) .... Take 1 Tablet By Mouth Once A Day 5)  Ecotrin 325 Mg  Tbec (Aspirin) .... Once Daily 6)  Fish Oil Concentrate  1000 Mg  Caps (Omega-3 Fatty Acids) .... One By Mouth Bid 7)  Vitamin C 500 Mg  Chew (Ascorbic Acid) .... Once Daily 8)  Avodart 0.5 Mg Caps (Dutasteride) .Marland Kitchen.. 1 By Mouth Once Daily 9)  Vitamin E 1000 Unit Caps (Vitamin E) .... Take 1 Capsule By Mouth Once A Day 10)  Vitamin B-12 500 Mcg Tabs (Cyanocobalamin) .... Take 1 Tablet By Mouth Once A Day  Allergies (verified): No Known Drug Allergies  Past History:  Past Medical History: Reviewed history from 08/31/2009 and no changes required. Hyperlipidemia Hypertension elevated PSA, hx of Coronary artery disease Diabetes mellitus, type II GERD Hemorrhoids Adenomatous Colon Polyps Arthritis Coronary artery disease status post diaphragmatic wall infarction     and subsequent coronary artery bypass graft surgery in 2003, now     stable.  prostate cancer.   Review of Systems       ROS is negative except as outlined in HPI.   Vital Signs:  Patient profile:   77 year old male Height:      70 inches Weight:      203.50 pounds Pulse rate:   65 / minute Pulse rhythm:   regular Resp:     18 per minute BP sitting:   130 / 70  (left arm) Cuff size:   large  Vitals Entered By: Vikki Ports (September 01, 2009 8:55 AM)  Physical Exam  Additional Exam:  Gen. Well-nourished, in no distress   Neck: No JVD, thyroid not enlarged, no carotid bruits Lungs: No tachypnea, clear without rales, rhonchi or wheezes Cardiovascular: Rhythm regular, PMI not  displaced,  heart sounds  normal, no murmurs or gallops, no peripheral edema, pulses normal in all 4 extremities. Abdomen: BS normal, abdomen soft and non-tender without masses or organomegaly, no hepatosplenomegaly. MS: No deformities, no cyanosis or clubbing   Neuro:  No focal sns   Skin:  no lesions    Impression & Recommendations:  Problem # 1:  CORONARY ARTERY DISEASE (ICD-414.00) He is post bypass surgery in 2003. He has had no symptoms of chest pain and this problem is stable.   His electrocardiogram was normal.  His updated medication list for this problem includes:    Benazepril-hydrochlorothiazide 20-12.5 Mg Tabs (Benazepril-hydrochlorothiazide) .Marland Kitchen..Marland Kitchen Two times a day    Ecotrin 325 Mg Tbec (Aspirin) ..... Once daily  Orders: EKG w/ Interpretation (93000) T-2 View CXR (71020TC)  Problem # 2:  CHEST PAIN UNSPECIFIED (ICD-786.50) He has had some right-sided chest pain related to reaching. This is clearly chest wall pain. We'll get an x-ray of the chest today. Orders: EKG w/ Interpretation (93000) T-2 View CXR (71020TC)  Problem # 3:  HYPERTENSION (ICD-401.9) His blood pressure is well controlled on current medications.  Problem # 4:  HYPERLIPIDEMIA (ICD-272.4) His primary care physician plans to check a lipid profile in the next few weeks. His updated medication list for this problem includes:    Lovastatin 20 Mg Tabs (Lovastatin) .Marland Kitchen... Take 1 tablet by mouth once a day  Patient Instructions: 1)  Your physician wants you to follow-up in: 1 year.  You will receive a reminder letter in the mail two months in advance. If you don't receive a letter, please call our office to schedule the follow-up appointment. 2)  A chest x-ray takes a picture of the organs and structures inside the chest, including the heart, lungs, and blood vessels. This test can show several things, including, whether the heart is enlarged; whether fluid is building up in the lungs; and whether pacemaker / defibrillator leads are still in place.

## 2010-11-25 NOTE — Letter (Signed)
Summary: Marlaine Hind MD Optometrist  Marlaine Hind Optometrist   Imported By: Lennie Odor 03/03/2010 14:52:54  _____________________________________________________________________  External Attachment:    Type:   Image     Comment:   External Document

## 2010-11-25 NOTE — Procedures (Signed)
Summary: EGD w/biopsies/[facility]  EGD w/biopsies/[facility]   Imported By: Sherian Rein 08/05/2009 08:14:09  _____________________________________________________________________  External Attachment:    Type:   Image     Comment:   External Document

## 2010-11-25 NOTE — Procedures (Signed)
Summary: Upper Endoscopy,Flexible Sigmoidoscopy/[facility]  Upper Endoscopy,Flexible Sigmoidoscopy/[facility]   Imported By: Sherian Rein 08/05/2009 08:04:57  _____________________________________________________________________  External Attachment:    Type:   Image     Comment:   External Document

## 2010-11-25 NOTE — Procedures (Signed)
Summary: colonoscopy   Colonoscopy  Procedure date:  08/13/2002  Findings:      Location:  [facility].  Results: Hemorrhoids.     Tubular Adenoma Polyps Patient Name: [name], [name] MRN:  Procedure Procedures: Panendoscopy (EGD) CPT: 43235.  Personnel: Endoscopist: Wilhemina Bonito. Marina Goodell, MD.  Exam Location: Exam performed in Outpatient Clinic. Outpatient  Patient Consent: Procedure, Alternatives, Risks and Benefits discussed, consent obtained, from patient. Consent was obtained by the RN.  Indications  Surveillance of: Duodenal adenoma.  History  Pre-Exam Physical: Performed Aug 13, 2002  Entire physical exam was normal.  Exam Exam Info: Maximum depth of insertion Duodenum, intended Duodenum. Patient position: on left side. Vocal cords visualized. Gastric retroflexion performed. Images taken. ASA Classification: III. Tolerance: excellent.  Sedation Meds: Patient assessed and found to be appropriate for moderate (conscious) sedation. Residual sedation present from prior procedure today. Versed 3 mg. given IV.  Monitoring: BP and pulse monitoring done. Oximetry used. Supplemental O2 given  Findings MUCOSAL ABNORMALITY: Body. Thin (atrophic) folds. Comment: Atrophic gastritis.  OTHER FINDING: Scar only. No recurrent adenoma. in Duodenal Apex.  ANGIODYSPLASIA (AVMs): Total of 1 AVMs,  maximum size 4 mm, non- bleeding, in Duodenal Apex. ICD9: Angiodysplasia without Hemorrhage: 569.84.   Assessment Abnormal examination, see findings above.  Diagnoses: 569.84: Angiodysplasia without Hemorrhage.  530.81: GERD.   Events  Unplanned Intervention: No unplanned interventions were required.  Unplanned Events: There were no complications. Plans Disposition: After procedure patient sent to recovery. After recovery patient sent home.  Scheduling: Follow-up prn.   This report was created from the original endoscopy report, which was reviewed and signed by  the above listed endoscopist.   cc: Darryll Capers, MD     Charlies Constable, MD

## 2010-11-25 NOTE — Progress Notes (Signed)
Summary: faxed office notes  Phone Note Call from Patient   Summary of Call: Felipa Evener handed me a note to fax ov and ekg for this patient fax [phone].  I faxed it and it went through. Initial call taken by: Celine Ahr,  Mar 20, 2008 11:36 AM

## 2010-11-25 NOTE — Progress Notes (Signed)
Summary: RELIEF NEEDED  Phone Note Call from Patient Call back at Home Phone [phone]   Caller: Patient - LIVE CALL                                                                    Reason for Call: Acute Illness Complaint: Earache/Ear Infection Summary of Call: HAVING SNEEZING, RUNNY NOSE, NO FEVER STATED. WOULD LIKE REIEF CALLED TO [facility] [address]. Initial call taken by: Warnell Forester,  Mar 05, 2009 9:00 AM  Follow-up for Phone Call        Pt is complaining of sneezing, nose running, no fever, no cough. Does not feel ill.......thinks it is allergy. [facility] [address]) Follow-up by: Lynann Beaver CMA,  Mar 05, 2009 10:39 AM  Additional Follow-up for Phone Call Additional follow up Details #1::        pt cb pt is aware doc seeing pt he will address Additional Follow-up by: Heron Sabins,  Mar 05, 2009 12:53 PM    Additional Follow-up for Phone Call Additional follow up Details #2::    per dr Jola Schmidt otc zyrtec Follow-up by: Willy Eddy, LPN,  Mar 05, 2009 1:04 PM  Additional Follow-up for Phone Call Additional follow up Details #3:: Details for Additional Follow-up Action Taken: Pt notified. Additional Follow-up by: Lynann Beaver CMA,  Mar 05, 2009 1:39 PM

## 2010-11-25 NOTE — Progress Notes (Signed)
Summary: [facility]   Imported By: Sherian Rein 08/05/2009 07:44:07  _____________________________________________________________________  External Attachment:    Type:   Image     Comment:   External Document

## 2010-11-25 NOTE — Assessment & Plan Note (Signed)
Summary: flu shot/njr  Nurse Visit    Prior Medications: BENAZEPRIL-HYDROCHLOROTHIAZIDE 20-12.5 MG TABS (BENAZEPRIL-HYDROCHLOROTHIAZIDE) two times a day GLUCOVANCE 5-500 MG TABS (GLYBURIDE-METFORMIN) two times a day NEXIUM 40 MG CPDR (ESOMEPRAZOLE MAGNESIUM) as needed LOVASTATIN 20 MG  TABS (LOVASTATIN) Take 1 tablet by mouth once a day ECOTRIN 325 MG  TBEC (ASPIRIN) once daily FISH OIL 300 MG  CAPS (OMEGA-3 FATTY ACIDS) once daily VITAMIN C 500 MG  CHEW (ASCORBIC ACID) once daily JANUVIA 100 MG  TABS (SITAGLIPTIN PHOSPHATE) ONE by mouth DAILY Current Allergies: No known allergies    Influenza Vaccine    Vaccine Type: Fluvax 3+    Given by: Raechel Ache, RN  Flu Vaccine Consent Questions    Do you have a history of severe allergic reactions to this vaccine? no    Any prior history of allergic reactions to egg and/or gelatin? no    Do you have a sensitivity to the preservative Thimersol? no    Do you have a past history of Guillan-Barre Syndrome? no    Do you currently have an acute febrile illness? no    Have you ever had a severe reaction to latex? no    Vaccine information given and explained to patient? yes   Orders Added: 1)  Flu Vaccine 43yrs + [90658] 2)  Admin 1st Vaccine [email or web address]    ]  Impression & Recommendations: lot [id number], EXP 30 jun 09, [employer] pasteur left deltoid IM, 0.5 cc.   Complete Medication List: 1)  Benazepril-hydrochlorothiazide 20-12.5 Mg Tabs (Benazepril-hydrochlorothiazide) .... Two times a day 2)  Glucovance 5-500 Mg Tabs (Glyburide-metformin) .... Two times a day 3)  Nexium 40 Mg Cpdr (Esomeprazole magnesium) .... As needed 4)  Lovastatin 20 Mg Tabs (Lovastatin) .... Take 1 tablet by mouth once a day 5)  Ecotrin 325 Mg Tbec (Aspirin) .... Once daily 6)  Fish Oil 300 Mg Caps (Omega-3 fatty acids) .... Once daily 7)  Vitamin C 500 Mg Chew (Ascorbic acid) .... Once daily 8)  Januvia 100 Mg Tabs (Sitagliptin phosphate) .... One by mouth  daily

## 2010-11-25 NOTE — Progress Notes (Signed)
Summary: GI [facility]  GI Office Note/[facility]   Imported By: Sherian Rein 08/05/2009 07:55:50  _____________________________________________________________________  External Attachment:    Type:   Image     Comment:   External Document

## 2010-11-25 NOTE — Progress Notes (Signed)
Summary: Heart burn & Epigastric pain/[facility]  Heart burn & Epigastric pain/[facility]   Imported By: Sherian Rein 08/05/2009 07:34:33  _____________________________________________________________________  External Attachment:    Type:   Image     Comment:   External Document

## 2010-11-25 NOTE — Consult Note (Signed)
Summary: [facility]   Imported By: Sherian Rein 08/05/2009 07:50:42  _____________________________________________________________________  External Attachment:    Type:   Image     Comment:   External Document

## 2010-11-25 NOTE — Progress Notes (Signed)
Summary: [facility]   Imported By: Sherian Rein 08/05/2009 07:35:52  _____________________________________________________________________  External Attachment:    Type:   Image     Comment:   External Document

## 2010-11-25 NOTE — Assessment & Plan Note (Signed)
Summary: [name]//CCM [facility] BMP/[facility]   Vital Signs:  Patient profile:   77 year old male Height:      70 inches Weight:      202 pounds BMI:     29.09 Temp:     98.2 degrees F oral Pulse rate:   72 / minute Resp:     14 per minute BP sitting:   130 / 74  (left arm)  Vitals Entered By: Willy Eddy, LPN (July 29, 2010 11:33 AM)  Contraindications/Deferment of Procedures/Staging:    Test/Procedure: FLU VAX    Reason for deferment: patient declined  CC: annual visit for disease management-fasting this am, Hypertension Management Is Patient Diabetic? Yes Did you bring your meter with you today? No   Primary Care Provider:  Peri Jefferson  CC:  annual visit for disease management-fasting this am and Hypertension Management.  History of Present Illness: The pt was asked about all immunizations, health maint. services that are appropriate to their age and was given guidance on diet exercize  and weight management   Blood sugar was 108 this AM with consistent reading in this range blood pressure stable no chest pain CABG was 2003 stable monitering labs and will forward to cardiology   Hypertension History:      He denies headache, chest pain, palpitations, dyspnea with exertion, orthopnea, PND, peripheral edema, visual symptoms, neurologic problems, syncope, and side effects from treatment.        Positive major cardiovascular risk factors include male age 17 years old or older, diabetes, hyperlipidemia, and hypertension.  Negative major cardiovascular risk factors include non-tobacco-user status.        Positive history for target organ damage include ASHD (either angina/prior MI/prior CABG).  Further assessment for target organ damage reveals no history of stroke/TIA or peripheral vascular disease.     Preventive Screening-Counseling & Management  Alcohol-Tobacco     Smoking Status: quit     Packs/Day: 1.0     Year Started: 1942     Year Quit:  1960  Problems Prior to Update: 1)  Constipation  (ICD-564.00) 2)  Chest Pain Unspecified  (ICD-786.50) 3)  Gerd  (ICD-530.81) 4)  Dyspepsia  (ICD-536.8) 5)  Hernia, Umbilical  (ICD-553.1) 6)  Hiatal Hernia With Reflux  (ICD-553.3) 7)  Degenerative Disc Disease, Cervical Spine  (ICD-722.4) 8)  Diabetes Mellitus, Type II  (ICD-250.00) 9)  Coronary Artery Disease  (ICD-414.00) 10)  Neoplasm, Malignant, Prostate  (ICD-185) 11)  Hypertension  (ICD-401.9) 12)  Hyperlipidemia  (ICD-272.4)  Current Problems (verified): 1)  Constipation  (ICD-564.00) 2)  Chest Pain Unspecified  (ICD-786.50) 3)  Gerd  (ICD-530.81) 4)  Dyspepsia  (ICD-536.8) 5)  Hernia, Umbilical  (ICD-553.1) 6)  Hiatal Hernia With Reflux  (ICD-553.3) 7)  Degenerative Disc Disease, Cervical Spine  (ICD-722.4) 8)  Diabetes Mellitus, Type II  (ICD-250.00) 9)  Coronary Artery Disease  (ICD-414.00) 10)  Neoplasm, Malignant, Prostate  (ICD-185) 11)  Hypertension  (ICD-401.9) 12)  Hyperlipidemia  (ICD-272.4)  Medications Prior to Update: 1)  Benazepril-Hydrochlorothiazide 20-12.5 Mg Tabs (Benazepril-Hydrochlorothiazide) .... Two Times A Day 2)  Glucovance 5-500 Mg Tabs (Glyburide-Metformin) .... Two Times A Day 3)  Nexium 40 Mg Cpdr (Esomeprazole Magnesium) .... As Needed 4)  Lovastatin 20 Mg  Tabs (Lovastatin) .... Take 1 Tablet By Mouth Once A Day 5)  Ecotrin 325 Mg  Tbec (Aspirin) .... Once Daily 6)  Fish Oil Concentrate 1000 Mg  Caps (Omega-3 Fatty Acids) .... One By Mouth Bid  7)  Vitamin C 500 Mg  Chew (Ascorbic Acid) .... Once Daily 8)  Avodart 0.5 Mg Caps (Dutasteride) .Marland Kitchen.. 1 By Mouth Once Daily 9)  Vitamin E 1000 Unit Caps (Vitamin E) .... Take 1 Capsule By Mouth Once A Day 10)  Vitamin B-12 500 Mcg Tabs (Cyanocobalamin) .... Take 1 Tablet By Mouth Once A Day 11)  Freestyle Lite Test  Strp (Glucose Blood) .... Three Times A Day  Current Medications (verified): 1)  Benazepril-Hydrochlorothiazide 20-12.5 Mg Tabs  (Benazepril-Hydrochlorothiazide) .... Two Times A Day 2)  Glucovance 5-500 Mg Tabs (Glyburide-Metformin) .... Two Times A Day 3)  Lovastatin 20 Mg  Tabs (Lovastatin) .... Take 1 Tablet By Mouth Once A Day 4)  Ecotrin 325 Mg  Tbec (Aspirin) .... Once Daily 5)  Fish Oil Concentrate 1000 Mg  Caps (Omega-3 Fatty Acids) .... One By Mouth Bid 6)  Vitamin C 500 Mg  Chew (Ascorbic Acid) .... Once Daily 7)  Avodart 0.5 Mg Caps (Dutasteride) .Marland Kitchen.. 1 By Mouth Once Daily 8)  Vitamin E 1000 Unit Caps (Vitamin E) .... Take 1 Capsule By Mouth Once A Day 9)  Vitamin B-12 500 Mcg Tabs (Cyanocobalamin) .... Take 1 Tablet By Mouth Once A Day 10)  Freestyle Lite Test  Strp (Glucose Blood) .... Three Times A Day  Allergies (verified): No Known Drug Allergies  Past History:  Family History: Last updated: 08/04/2009 Family History of Diabetes:  Family History of Heart Disease:   Social History: Last updated: 08/04/2009 Retired divorced Former Smoker 4 children Daily Caffeine Use Illicit Drug Use - no  Risk Factors: Exercise: no (08/15/2007)  Risk Factors: Smoking Status: quit (07/29/2010) Packs/Day: 1.0 (07/29/2010)  Past medical, surgical, family and social histories (including risk factors) reviewed, and no changes noted (except as noted below).  Past Medical History: Reviewed history from 08/31/2009 and no changes required. Hyperlipidemia Hypertension elevated PSA, hx of Coronary artery disease Diabetes mellitus, type II GERD Hemorrhoids Adenomatous Colon Polyps Arthritis Coronary artery disease status post diaphragmatic wall infarction     and subsequent coronary artery bypass graft surgery in 2003, now     stable.  prostate cancer.   Past Surgical History: Reviewed history from 07/30/2009 and no changes required. CABG   Family History: Reviewed history from 08/04/2009 and no changes required. Family History of Diabetes:  Family History of Heart Disease:   Social  History: Reviewed history from 08/04/2009 and no changes required. Retired divorced Former Smoker 4 children Daily Caffeine Use Illicit Drug Use - no  Review of Systems  The patient denies anorexia, fever, weight loss, weight gain, vision loss, decreased hearing, hoarseness, chest pain, syncope, dyspnea on exertion, peripheral edema, prolonged cough, headaches, hemoptysis, abdominal pain, melena, hematochezia, severe indigestion/heartburn, hematuria, incontinence, genital sores, muscle weakness, suspicious skin lesions, transient blindness, difficulty walking, depression, unusual weight change, abnormal bleeding, enlarged lymph nodes, angioedema, and breast masses.    Physical Exam  General:  Well-developed,well-nourished,in no acute distress; alert,appropriate and cooperative throughout examination Head:  male-pattern balding.   Ears:  R ear normal and L ear normal.   hearing aid in left ear Nose:  no external deformity.  no nasal discharge.   Mouth:  pharynx pink and moist.   Neck:  supple and decreased ROM.   Lungs:  Normal respiratory effort, chest expands symmetrically. Lungs are clear to auscultation, no crackles or wheezes.  Heart:  normal rate and regular rhythm.   Abdomen:  soft, non-tender, and normal bowel sounds.   Msk:  normal  ROM, no joint swelling, and no joint warmth.   Extremities:  trace left pedal edema and trace right pedal edema.   Neurologic:  alert & oriented X3 and finger-to-nose normal.    Diabetes Management Exam:    Foot Exam (with socks and/or shoes not present):       Sensory-Pinprick/Light touch:          Left medial foot (L-4): diminished          Left dorsal foot (L-5): diminished          Left lateral foot (S-1): diminished          Right medial foot (L-4): diminished          Right dorsal foot (L-5): diminished          Right lateral foot (S-1): diminished       Sensory-Monofilament:          Left foot: diminished          Right foot:  diminished   Impression & Recommendations:  Problem # 1:  PREVENTIVE HEALTH CARE (ICD-V70.0)  The pt was asked about all immunizations, health maint. services that are appropriate to their age and was given guidance on diet exercize  and weight management  Colonoscopy: Location:  [facility].  Results: Hemorrhoids.     Tubular Adenoma Polyps (08/13/2002) Td Booster: historical (10/24/2005)   Flu Vax: Historical (06/14/2010)   Pneumovax: historical (10/24/2005) Chol: 155 (09/15/2009)   HDL: 48.40 (09/15/2009)   LDL: 89 (09/15/2009)   TG: 89.0 (09/15/2009) TSH: 1.45 (09/15/2009)   HgbA1C: 7.3 (01/14/2010)    Discussed using sunscreen, use of alcohol, drug use, self testicular exam, routine dental care, routine eye care, routine physical exam, seat belts, multiple vitamins, osteoporosis prevention, adequate calcium intake in diet, and recommendations for immunizations.  Discussed exercise and checking cholesterol.  Discussed gun safety, safe sex, and contraception. Also recommend checking PSA.  Orders: TLB-BMP (Basic Metabolic Panel-BMET) (80048-METABOL) TLB-CBC Platelet - w/Differential (85025-CBCD) TLB-Hepatic/Liver Function Pnl (80076-HEPATIC) TLB-TSH (Thyroid Stimulating Hormone) (84443-TSH) TLB-Lipid Panel (80061-LIPID) Venipuncture (24401)  Problem # 2:  DIABETES MELLITUS, TYPE II (ICD-250.00) monitering a1c with CPX labs His updated medication list for this problem includes:    Benazepril-hydrochlorothiazide 20-12.5 Mg Tabs (Benazepril-hydrochlorothiazide) .Marland Kitchen..Marland Kitchen Two times a day    Glucovance 5-500 Mg Tabs (Glyburide-metformin) .Marland Kitchen..Marland Kitchen Two times a day    Ecotrin 325 Mg Tbec (Aspirin) ..... Once daily  Labs Reviewed: Creat: 1.2 (01/14/2010)    Reviewed HgBA1c results: 7.3 (01/14/2010)  7.5 (09/15/2009)  Problem # 3:  NEOPLASM, MALIGNANT, PROSTATE (ICD-185) seeing Dr Earlene Plater but since he left will be transfering to anoth provider Orders: TLB-PSA (Prostate Specific  Antigen) (84153-PSA)  Problem # 4:  HYPERTENSION (ICD-401.9)  His updated medication list for this problem includes:    Benazepril-hydrochlorothiazide 20-12.5 Mg Tabs (Benazepril-hydrochlorothiazide) .Marland Kitchen..Marland Kitchen Two times a day  BP today: 130/74 Prior BP: 132/72 (01/14/2010)  Prior 10 Yr Risk Heart Disease: N/A (08/15/2007)  Labs Reviewed: K+: 4.2 (01/14/2010) Creat: : 1.2 (01/14/2010)   Chol: 155 (09/15/2009)   HDL: 48.40 (09/15/2009)   LDL: 89 (09/15/2009)   TG: 89.0 (09/15/2009)  Complete Medication List: 1)  Benazepril-hydrochlorothiazide 20-12.5 Mg Tabs (Benazepril-hydrochlorothiazide) .... Two times a day 2)  Glucovance 5-500 Mg Tabs (Glyburide-metformin) .... Two times a day 3)  Lovastatin 20 Mg Tabs (Lovastatin) .... Take 1 tablet by mouth once a day 4)  Ecotrin 325 Mg Tbec (Aspirin) .... Once daily 5)  Fish Oil Concentrate 1000 Mg Caps (  Omega-3 fatty acids) .... One by mouth bid 6)  Vitamin C 500 Mg Chew (Ascorbic acid) .... Once daily 7)  Avodart 0.5 Mg Caps (Dutasteride) .Marland Kitchen.. 1 by mouth once daily 8)  Vitamin E 1000 Unit Caps (Vitamin e) .... Take 1 capsule by mouth once a day 9)  Vitamin B-12 500 Mcg Tabs (Cyanocobalamin) .... Take 1 tablet by mouth once a day 10)  Freestyle Lite Test Strp (Glucose blood) .... Three times a day  Hypertension Assessment/Plan:      The patient's hypertensive risk group is category C: Target organ damage and/or diabetes.  Today's blood pressure is 130/74.  His blood pressure goal is < 130/80.  Patient Instructions: 1)  Please schedule a follow-up appointment in 6 months.  Appended Document: Orders Update    Clinical Lists Changes  Orders: Added new Service order of Specimen Handling ([id number]) - Signed

## 2010-11-25 NOTE — Procedures (Signed)
Summary: Endoscopy Report  Endoscopy Report   Imported By: Sherian Rein 08/05/2009 08:02:11  _____________________________________________________________________  External Attachment:    Type:   Image     Comment:   External Document

## 2010-11-25 NOTE — Progress Notes (Signed)
Summary: BS  Phone Note Call from Patient   Caller: Patient Call For: [name] Glaze MD Reason for Call: Acute Illness Summary of Call: FBS 94, ate breakfast 172,  204    10 minutes ago it was 323 Pt is taking glyburide 5/500 two times a day. [phone] Took the third glyburide 30 minutes ago and BS is 323 Initial call taken by: Lynann Beaver CMA AAMA,  October 13, 2010 4:22 PM  Follow-up for Phone Call        per dr Lovell Sheehan- since psa is high, possible prostatitis- and causing bs to be fluctuation- give  cipro 250 bid for 14 days Follow-up by: Willy Eddy, LPN,  October 13, 2010 5:08 PM    New/Updated Medications: CIPRO 250 MG TABS (CIPROFLOXACIN HCL) two times a day for 14 days Prescriptions: CIPRO 250 MG TABS (CIPROFLOXACIN HCL) two times a day for 14 days  #28 x 0   Entered by:   Willy Eddy, LPN   Authorized by:   [name] Glaze MD   Signed by:   Willy Eddy, LPN on 16/10/930   Method used:   Electronically to        [facility] [address] [phone]* (retail)       [address]       [place], [place]  [address]       Ph: [phone]       Fax: [phone]   RxID:   [id number]

## 2010-11-25 NOTE — Miscellaneous (Signed)
Summary: Orders Update  Clinical Lists Changes 

## 2010-11-25 NOTE — Assessment & Plan Note (Signed)
Summary: fu on pt/njr   Vital Signs:  Patient profile:   77 year old male Weight:      202 pounds Temp:     98.6 degrees F oral BP sitting:   126 / 80  Vitals Entered By: Lynann Beaver CMA (May 12, 2009 1:24 PM) CC: rov, Hypertension Management Is Patient Diabetic? No Pain Assessment Patient in pain? no        CC:  rov and Hypertension Management.  History of Present Illness: Saw urologist and he felt that there were no new "hard spots on prostate" and he will recheck in 6 months he believes that CBG have improved and is due a Hgb A 1 C he needs a lipid check as well and in concerned over a periumbical hernia  Hypertension History:      He denies headache, chest pain, palpitations, dyspnea with exertion, orthopnea, PND, peripheral edema, visual symptoms, neurologic problems, syncope, and side effects from treatment.        Positive major cardiovascular risk factors include male age 78 years old or older, diabetes, hyperlipidemia, and hypertension.  Negative major cardiovascular risk factors include non-tobacco-user status.        Positive history for target organ damage include ASHD (either angina/prior MI/prior CABG).     Problems Prior to Update: 1)  Hernia, Umbilical  (ICD-553.1) 2)  Hiatal Hernia With Reflux  (ICD-553.3) 3)  Degenerative Disc Disease, Cervical Spine  (ICD-722.4) 4)  Diabetes Mellitus, Type II  (ICD-250.00) 5)  Coronary Artery Disease  (ICD-414.00) 6)  Neoplasm, Malignant, Prostate  (ICD-185) 7)  Hypertension  (ICD-401.9) 8)  Hyperlipidemia  (ICD-272.4)  Medications Prior to Update: 1)  Benazepril-Hydrochlorothiazide 20-12.5 Mg Tabs (Benazepril-Hydrochlorothiazide) .... Two Times A Day 2)  Glucovance 5-500 Mg Tabs (Glyburide-Metformin) .... Two Times A Day 3)  Nexium 40 Mg Cpdr (Esomeprazole Magnesium) .... As Needed 4)  Lovastatin 20 Mg  Tabs (Lovastatin) .... Take 1 Tablet By Mouth Once A Day 5)  Ecotrin 325 Mg  Tbec (Aspirin) .... Once  Daily 6)  Fish Oil Concentrate 1000 Mg  Caps (Omega-3 Fatty Acids) .... One By Mouth Bid 7)  Vitamin C 500 Mg  Chew (Ascorbic Acid) .... Once Daily 8)  Avodart 0.5 Mg Caps (Dutasteride) 9)  Zithromax Z-Pak 250 Mg Tabs (Azithromycin) .... As Directed 10)  Hydromet 5-1.5 Mg/22ml Syrp (Hydrocodone-Homatropine) .... One Tsp Every 6 Hours As Needed For Cough  Current Medications (verified): 1)  Benazepril-Hydrochlorothiazide 20-12.5 Mg Tabs (Benazepril-Hydrochlorothiazide) .... Two Times A Day 2)  Glucovance 5-500 Mg Tabs (Glyburide-Metformin) .... Two Times A Day 3)  Nexium 40 Mg Cpdr (Esomeprazole Magnesium) .... As Needed 4)  Lovastatin 20 Mg  Tabs (Lovastatin) .... Take 1 Tablet By Mouth Once A Day 5)  Ecotrin 325 Mg  Tbec (Aspirin) .... Once Daily 6)  Fish Oil Concentrate 1000 Mg  Caps (Omega-3 Fatty Acids) .... One By Mouth Bid 7)  Vitamin C 500 Mg  Chew (Ascorbic Acid) .... Once Daily 8)  Avodart 0.5 Mg Caps (Dutasteride)  Allergies (verified): No Known Drug Allergies  Past History:  Social History: Last updated: 06/12/2007 Retired Married Former Smoker  Risk Factors: Exercise: no (08/15/2007)  Risk Factors: Smoking Status: quit (06/12/2007)  Past medical, surgical, family and social histories (including risk factors) reviewed, and no changes noted (except as noted below).  Past Medical History: Reviewed history from 06/04/2007 and no changes required. Hyperlipidemia Hypertension elevated PSA, hx of Coronary artery disease Diabetes mellitus, type II  Family History: Reviewed  history and no changes required.  Social History: Reviewed history from 06/12/2007 and no changes required. Retired Married Former Smoker  Review of Systems  The patient denies anorexia, fever, weight loss, weight gain, vision loss, decreased hearing, hoarseness, chest pain, syncope, dyspnea on exertion, peripheral edema, prolonged cough, headaches, hemoptysis, abdominal pain, melena,  hematochezia, severe indigestion/heartburn, hematuria, incontinence, genital sores, muscle weakness, suspicious skin lesions, transient blindness, difficulty walking, depression, unusual weight change, abnormal bleeding, enlarged lymph nodes, angioedema, and breast masses.    Physical Exam  General:  Well-developed,well-nourished,in no acute distress; alert,appropriate and cooperative throughout examination Head:  male-pattern balding.   Ears:  R ear normal and L ear normal.   Nose:  no external deformity.  no nasal discharge.   Mouth:  pharynx pink and moist.   Neck:  supple and decreased ROM.   Lungs:  Normal respiratory effort, chest expands symmetrically. Lungs are clear to auscultation, no crackles or wheezes. Heart:  normal rate and regular rhythm.   Abdomen:  soft, non-tender, and normal bowel sounds.     Impression & Recommendations:  Problem # 1:  HERNIA, UMBILICAL (ICD-553.1) stable  Problem # 2:  DIABETES MELLITUS, TYPE II (ICD-250.00)  His updated medication list for this problem includes:    Benazepril-hydrochlorothiazide 20-12.5 Mg Tabs (Benazepril-hydrochlorothiazide) .Marland Kitchen..Marland Kitchen Two times a day    Glucovance 5-500 Mg Tabs (Glyburide-metformin) .Marland Kitchen..Marland Kitchen Two times a day    Ecotrin 325 Mg Tbec (Aspirin) ..... Once daily  Labs Reviewed: Creat: 1.1 (08/21/2008)    Reviewed HgBA1c results: 8.0 (08/21/2008)  7.8 (11/14/2007)  Orders: TLB-A1C / Hgb A1C (Glycohemoglobin) (83036-A1C)  Problem # 3:  HYPERTENSION (ICD-401.9) Assessment: Unchanged stalbe His updated medication list for this problem includes:    Benazepril-hydrochlorothiazide 20-12.5 Mg Tabs (Benazepril-hydrochlorothiazide) .Marland Kitchen..Marland Kitchen Two times a day  Orders: Venipuncture (16109) TLB-CBC Platelet - w/Differential (85025-CBCD)  BP today: 126/80 Prior BP: 110/70 (08/21/2008)  Prior 10 Yr Risk Heart Disease: N/A (08/15/2007)  Labs Reviewed: K+: 4.8 (08/21/2008) Creat: : 1.1 (08/21/2008)   Chol: 133 (08/21/2008)    HDL: 43.7 (08/21/2008)   LDL: 62 (08/21/2008)   TG: 136 (08/21/2008)  Problem # 4:  HYPERLIPIDEMIA (ICD-272.4) Assessment: Unchanged recheck His updated medication list for this problem includes:    Lovastatin 20 Mg Tabs (Lovastatin) .Marland Kitchen... Take 1 tablet by mouth once a day  Orders: TLB-Cholesterol, HDL (83718-HDL) TLB-Cholesterol, Direct LDL (83721-DIRLDL) TLB-Cholesterol, Total (82465-CHO)  Labs Reviewed: SGOT: 16 (06/05/2007)   SGPT: 19 (06/05/2007)  Prior 10 Yr Risk Heart Disease: N/A (08/15/2007)   HDL:43.7 (08/21/2008), 40.4 (06/05/2007)  LDL:62 (08/21/2008), 72 (06/05/2007)  Chol:133 (08/21/2008), 130 (06/05/2007)  Trig:136 (08/21/2008), 89 (06/05/2007)  Complete Medication List: 1)  Benazepril-hydrochlorothiazide 20-12.5 Mg Tabs (Benazepril-hydrochlorothiazide) .... Two times a day 2)  Glucovance 5-500 Mg Tabs (Glyburide-metformin) .... Two times a day 3)  Nexium 40 Mg Cpdr (Esomeprazole magnesium) .... As needed 4)  Lovastatin 20 Mg Tabs (Lovastatin) .... Take 1 tablet by mouth once a day 5)  Ecotrin 325 Mg Tbec (Aspirin) .... Once daily 6)  Fish Oil Concentrate 1000 Mg Caps (Omega-3 fatty acids) .... One by mouth bid 7)  Vitamin C 500 Mg Chew (Ascorbic acid) .... Once daily 8)  Avodart 0.5 Mg Caps (Dutasteride)  Hypertension Assessment/Plan:      The patient's hypertensive risk group is category C: Target organ damage and/or diabetes.  Today's blood pressure is 126/80.  His blood pressure goal is < 130/80.  Patient Instructions: 1)  Please schedule a follow-up appointment in 4 months.  Prescriptions: NEXIUM 40 MG CPDR (ESOMEPRAZOLE MAGNESIUM) as needed  #90 x 3   Entered and Authorized by:   Stacie Glaze MD   Signed by:   Stacie Glaze MD on 05/12/2009   Method used:   Faxed to ...       [facility] (mail-order)             , [place]         Ph: [phone]       Fax: [phone]   RxID:   [id number]

## 2010-11-25 NOTE — Assessment & Plan Note (Signed)
Summary: M2A/FUP/RCD   Vital Signs:  Patient Profile:   77 Years Old Male Height:     70 inches Temp:     98.4 degrees F oral Pulse rate:   76 / minute Resp:     14 per minute BP sitting:   130 / 80  Vitals Entered By: Willy Eddy, LPN (August 15, 2007 10:10 AM)                 Chief Complaint:  roa.  Diabetes Management History:      The patient is an 77 years old male who comes in for evaluation of DM Type 2.  He has not been enrolled in the "Diabetic Education Program".  He states understanding of dietary principles and is following his diet appropriately.  Sensory loss is noted.  Self foot exams are being performed.  He is checking home blood sugars.  He says that he is not exercising regularly.        Reported hypoglycemic symptoms include sweats.  Frequency of hypoglycemic symptoms are reported to be seldom.  No hyperglycemic symptoms are reported.        Since his last visit, no infections have occurred.  The following changes have been made to his treatment plan since last visit: medication changes.  Treatment plan changes were initiated by patient.    Hypertension History:      He denies headache, chest pain, palpitations, dyspnea with exertion, orthopnea, PND, peripheral edema, visual symptoms, neurologic problems, syncope, and side effects from treatment.        Positive major cardiovascular risk factors include male age 70 years old or older, diabetes, hyperlipidemia, and hypertension.  Negative major cardiovascular risk factors include non-tobacco-user status.        Positive history for target organ damage include ASHD (either angina; prior MI; prior CABG).      Current Allergies: No known allergies   Past Medical History:    Reviewed history from 06/04/2007 and no changes required:       Hyperlipidemia       Hypertension       elevated PSA, hx of       Coronary artery disease       Diabetes mellitus, type II   Family History:    Reviewed history  and no changes required:  Social History:    Reviewed history from 06/12/2007 and no changes required:       Retired       Married       Former Smoker   Risk Factors:  Exercise:  no   Review of Systems  The patient denies weight loss, decreased hearing, hoarseness, chest pain, syncope, dyspnea on exhertion, peripheral edema, prolonged cough, hemoptysis, abdominal pain, melena, hematochezia, and severe indigestion/heartburn.     Physical Exam  General:     overweight-appearing.   Head:     atraumatic and male-pattern balding.   Eyes:     pupils equal and pupils round.   Ears:     R ear normal and L ear normal.   Nose:     no nasal discharge.   Mouth:     pharynx pink and moist.   Neck:     No deformities, masses, or tenderness noted. Lungs:     Normal respiratory effort, chest expands symmetrically. Lungs are clear to auscultation, no crackles or wheezes. Heart:     Normal rate and regular rhythm. S1 and S2 normal without gallop, murmur, click, rub  or other extra sounds. Prostate:     Prostate gland firm and smooth, no enlargement, nodularity, tenderness, mass, asymmetry or induration. Msk:     No deformity or scoliosis noted of thoracic or lumbar spine.   Cervical Nodes:     No lymphadenopathy noted Axillary Nodes:     No palpable lymphadenopathy Inguinal Nodes:     No significant adenopathy Psych:     Oriented X3.      Impression & Recommendations:  Problem # 1:  DIABETES MELLITUS, TYPE II (ICD-250.00) pt needs to exercize and loose a bit of weight The following medications were removed from the medication list:    Januvia 100 Mg Tabs (Sitagliptin phosphate) ..... One by mouth daily  His updated medication list for this problem includes:    Benazepril-hydrochlorothiazide 20-12.5 Mg Tabs (Benazepril-hydrochlorothiazide) .Marland Kitchen..Marland Kitchen Two times a day    Glucovance 5-500 Mg Tabs (Glyburide-metformin) .Marland Kitchen..Marland Kitchen Two times a day    Ecotrin 325 Mg Tbec (Aspirin) .....  Once daily  Labs Reviewed: HgBA1c: 7.4 (06/05/2007)   Creat: 0.9 (06/05/2007)      Problem # 2:  PSA, INCREASED (ICD-790.93) seeing  Problem # 3:  HYPERLIPIDEMIA (ICD-272.4)  His updated medication list for this problem includes:    Lovastatin 20 Mg Tabs (Lovastatin) .Marland Kitchen... Take 1 tablet by mouth once a day  Labs Reviewed: Chol: 130 (06/05/2007)   HDL: 40.4 (06/05/2007)   LDL: 72 (06/05/2007)   TG: 89 (06/05/2007) SGOT: 16 (06/05/2007)   SGPT: 19 (06/05/2007)  10 Yr Risk Heart Disease: N/A   Complete Medication List: 1)  Benazepril-hydrochlorothiazide 20-12.5 Mg Tabs (Benazepril-hydrochlorothiazide) .... Two times a day 2)  Glucovance 5-500 Mg Tabs (Glyburide-metformin) .... Two times a day 3)  Nexium 40 Mg Cpdr (Esomeprazole magnesium) .... As needed 4)  Lovastatin 20 Mg Tabs (Lovastatin) .... Take 1 tablet by mouth once a day 5)  Ecotrin 325 Mg Tbec (Aspirin) .... Once daily 6)  Fish Oil Concentrate 1000 Mg Caps (Omega-3 fatty acids) .... One by mouth bid 7)  Vitamin C 500 Mg Chew (Ascorbic acid) .... Once daily  Diabetes Management Assessment/Plan:      The following lipid goals have been established for the patient: Total cholesterol goal of 200; LDL cholesterol goal of 100; HDL cholesterol goal of 40; Triglyceride goal of 200.  His blood pressure goal is < 130/80.    Hypertension Assessment/Plan:      The patient's hypertensive risk group is category C: Target organ damage and/or diabetes.  Today's blood pressure is 130/80.  His blood pressure goal is < 130/80.   Patient Instructions: 1)  Please schedule a follow-up appointment in 3 months. 2)  It is important that you exercise regularly at least 20 minutes 5 times a week. If you develop chest pain, have severe difficulty breathing, or feel very tired , stop exercising immediately and seek medical attention.    ]

## 2010-11-25 NOTE — Assessment & Plan Note (Signed)
Summary: GERD/REFLUX/YF    History of Present Illness Visit Type: Initial Visit Primary GI MD: Yancey Flemings MD Primary Provider: Peri Jefferson Requesting Provider: n/a Chief Complaint: Genella Rife Issues History of Present Illness:   77 year old African American male with multiple medical problems including hypertension, hyperlipidemia, diabetes mellitus, coronary artery disease status post CABG, prostate cancer, colon polyps, and GERD. He presents today for evaluation of dyspeptic symptoms. He reports to me a sensation of fullness or indigestion several times per week. He takes Nexium on demand. No significant pyrosis. No dysphagia. He has had some weight loss, though he reports this to be intentional and as a result of dieting. No melena or hematochezia. He has had a duodenal adenoma removed previously. Blood work from July 2010 (reviewed) shows a normal hemoglobin of 13.3. Hemoglobin A1c level VII.6. He has had no nausea or vomiting. He is concerned about his symptoms inquires about endoscopy. Start clear that he has tried proton pump inhibitor therapy regularly. The patient's last documented endoscopy was in 2005. In addition to a hiatal hernia, patient was noted to have an incidental proximal gastric submucosal nodule as well as duodenal AVM. His chronic medical problems have been stable. He takes oral agents for his diabetes.   GI Review of Systems    Reports acid reflux and  weight loss.      Denies abdominal pain, belching, bloating, chest pain, dysphagia with liquids, dysphagia with solids, heartburn, loss of appetite, nausea, vomiting, vomiting blood, and  weight gain.        Denies anal fissure, black tarry stools, change in bowel habit, constipation, diarrhea, diverticulosis, fecal incontinence, heme positive stool, hemorrhoids, irritable bowel syndrome, jaundice, light color stool, liver problems, rectal bleeding, and  rectal pain.    Current Medications (verified): 1)   Benazepril-Hydrochlorothiazide 20-12.5 Mg Tabs (Benazepril-Hydrochlorothiazide) .... Two Times A Day 2)  Glucovance 5-500 Mg Tabs (Glyburide-Metformin) .... Two Times A Day 3)  Nexium 40 Mg Cpdr (Esomeprazole Magnesium) .... As Needed 4)  Lovastatin 20 Mg  Tabs (Lovastatin) .... Take 1 Tablet By Mouth Once A Day 5)  Ecotrin 325 Mg  Tbec (Aspirin) .... Once Daily 6)  Fish Oil Concentrate 1000 Mg  Caps (Omega-3 Fatty Acids) .... One By Mouth Bid 7)  Vitamin C 500 Mg  Chew (Ascorbic Acid) .... Once Daily 8)  Avodart 0.5 Mg Caps (Dutasteride) .Marland Kitchen.. 1 By Mouth Once Daily  Allergies (verified): No Known Drug Allergies  Past History:  Past Medical History: Hyperlipidemia Hypertension elevated PSA, hx of Coronary artery disease Diabetes mellitus, type II GERD Hemorrhoids Adenomatous Colon Polyps Arthritis  Past Surgical History: Reviewed history from 07/30/2009 and no changes required. CABG   Family History: Family History of Diabetes:  Family History of Heart Disease:   Social History: Retired divorced Former Smoker 4 children Daily Caffeine Use Illicit Drug Use - no Drug Use:  no  Review of Systems       non-GI review of systems reported to be entirely negative.  Vital Signs:  Patient profile:   77 year old male Height:      70 inches Weight:      203 pounds Pulse rate:   80 / minute Pulse rhythm:   regular BP sitting:   132 / 78  (left arm)  Vitals Entered By: Merri Ray CMA Duncan Dull) (August 04, 2009 9:55 AM)  Physical Exam  General:  Well developed, well nourished, no acute distress. Head:  Normocephalic and atraumatic. Eyes:  anicteric Nose:  No deformity,  discharge,  or lesions. Mouth:  No deformity or lesions,  Neck:  Supple; no masses or thyromegaly. Lungs:  Clear throughout to auscultation. Heart:  Regular rate and rhythm; no murmurs, rubs,  or bruits. Abdomen:  Soft, nontender and nondistended. No masses, hepatosplenomegaly or hernias noted.  Normal bowel sounds. Msk:  Symmetrical with no gross deformities. Normal posture. Pulses:  Normal pulses noted. Extremities:  No clubbing, cyanosis, edema or deformities noted. Neurologic:  Alert and  oriented x4;  grossly normal neurologically. Skin:  Intact without significant lesions or rashes. Cervical Nodes:  No significant cervical adenopathy.no supraclavicular adenopathy Psych:  Alert and cooperative. Normal mood and affect.   Impression & Recommendations:  Problem # 1:  DYSPEPSIA (ICD-536.8) The patient presents with persistent dyspeptic symptoms. He has had mild weight loss though this seems to be intentional. He takes proton pump inhibitor sporadically. Prior endoscopy with submucosal gastric nodule proximally as well as resected duodenal adenoma.  Plan: We discussed several options including increasing frequency of proton pump inhibitor therapy to daily, versus radiology studies, versus endoscopy. Patient is in favor of repeating endoscopy. I think that this is reasonable to reassess his upper gastrointestinal mucosa, particularly given mild weight loss. The nature of the procedure as well as the risks, benefits, and alternatives have been reviewed. She understood and agreed to proceed. If the endoscopic examination is unremarkable for significant abnormalities, then I would recommend daily proton pump inhibitor therapy and see how he does.  Problem # 2:  GERD (ICD-530.81) history GERD. Likely plan role in symptom complex though not entirely clear. We'll proceed with the plan as outlined above. In addition, recommend reflux precautions.  Problem # 3:  DIABETES MELLITUS, TYPE II (ICD-250.00) need to manage diabetic medications the day of the procedure. The patient has been instructed to hold his diabetic medication the day of the examination until he resumes normal p.o. intake.  Problem # 4:  CORONARY ARTERY DISEASE (ICD-414.00) The patient is HIGH RISK to do his age and multiple  comorbidities. However he is clinically stable and deemed appropriate candidate for the examination.  Other Orders: EGD (EGD)  Patient Instructions: 1)  EGD [facility] 08/18/09 10:30 am 2)  Hold diabetic meds morning of procedure. 3)  Upper Endoscopy brochure given.  4)  The medication list was reviewed and reconciled.  All changed / newly prescribed medications were explained.  A complete medication list was provided to the patient / caregiver.

## 2010-11-25 NOTE — Assessment & Plan Note (Signed)
Summary: roa/bmw   Vital Signs:  Patient Profile:   77 Years Old Male Height:     70 inches Weight:      206 pounds Temp:     98.5 degrees F oral Pulse rate:   76 / minute Resp:     14 per minute BP sitting:   110 / 70  (left arm)  Vitals Entered By: Willy Eddy, LPN (August 21, 2008 12:41 PM)                 Chief Complaint:  roa.  Hypertension History:      He denies headache, chest pain, palpitations, dyspnea with exertion, orthopnea, PND, peripheral edema, visual symptoms, neurologic problems, syncope, and side effects from treatment.        Positive major cardiovascular risk factors include male age 91 years old or older, diabetes, hyperlipidemia, and hypertension.  Negative major cardiovascular risk factors include non-tobacco-user status.        Positive history for target organ damage include ASHD (either angina/prior MI/prior CABG).        Prior Medication List:  BENAZEPRIL-HYDROCHLOROTHIAZIDE 20-12.5 MG TABS (BENAZEPRIL-HYDROCHLOROTHIAZIDE) two times a day GLUCOVANCE 5-500 MG TABS (GLYBURIDE-METFORMIN) two times a day NEXIUM 40 MG CPDR (ESOMEPRAZOLE MAGNESIUM) as needed LOVASTATIN 20 MG  TABS (LOVASTATIN) Take 1 tablet by mouth once a day ECOTRIN 325 MG  TBEC (ASPIRIN) once daily FISH OIL CONCENTRATE 1000 MG  CAPS (OMEGA-3 FATTY ACIDS) one by mouth bid VITAMIN C 500 MG  CHEW (ASCORBIC ACID) once daily PRILOSEC 20 MG  CPDR (OMEPRAZOLE) one by mouth daily   Current Allergies (reviewed today): No known allergies   Past Medical History:    Reviewed history from 06/04/2007 and no changes required:       Hyperlipidemia       Hypertension       elevated PSA, hx of       Coronary artery disease       Diabetes mellitus, type II   Social History:    Reviewed history from 06/12/2007 and no changes required:       Retired       Married       Former Smoker   Risk Factors: Tobacco use:  quit Exercise:  no  Colonoscopy History:    Date of Last  Colonoscopy:  10/25/1995   Review of Systems       The patient complains of dyspnea on exertion and peripheral edema.  The patient denies anorexia, fever, weight loss, weight gain, vision loss, decreased hearing, hoarseness, chest pain, syncope, prolonged cough, headaches, hemoptysis, abdominal pain, melena, hematochezia, severe indigestion/heartburn, hematuria, incontinence, genital sores, muscle weakness, suspicious skin lesions, transient blindness, difficulty walking, depression, unusual weight change, abnormal bleeding, enlarged lymph nodes, angioedema, breast masses, and testicular masses.     Physical Exam  General:     Well-developed,well-nourished,in no acute distress; alert,appropriate and cooperative throughout examination Eyes:     pupils equal and pupils round.   Nose:     no external deformity.  no nasal discharge.   Mouth:     pharynx pink and moist.   Neck:     supple and decreased ROM.   Lungs:     Normal respiratory effort, chest expands symmetrically. Lungs are clear to auscultation, no crackles or wheezes. Heart:     normal rate and regular rhythm.      Impression & Recommendations:  Problem # 1:  NEOPLASM, MALIGNANT, PROSTATE (ICD-185) Assessment: New pt may have  radiation on avodart to see if this will slow the growth of the cancer and no other intervention may e needed so he w2ill have a repeat bipsy after several months of the avodart. discussed optiont for recurrent prostat ca therapy with pt I have spent greater that 30 min face to face evaluating this patient of which 1/2 was in counsilling   Problem # 2:  CORONARY ARTERY DISEASE (ICD-414.00) Assessment: Unchanged  His updated medication list for this problem includes:    Benazepril-hydrochlorothiazide 20-12.5 Mg Tabs (Benazepril-hydrochlorothiazide) .Marland Kitchen..Marland Kitchen Two times a day    Ecotrin 325 Mg Tbec (Aspirin) ..... Once daily  Labs Reviewed: Chol: 130 (06/05/2007)   HDL: 40.4 (06/05/2007)   LDL: 72  (06/05/2007)   TG: 89 (06/05/2007)   Problem # 3:  DIABETES MELLITUS, TYPE II (ICD-250.00) Assessment: Unchanged  His updated medication list for this problem includes:    Benazepril-hydrochlorothiazide 20-12.5 Mg Tabs (Benazepril-hydrochlorothiazide) .Marland Kitchen..Marland Kitchen Two times a day    Glucovance 5-500 Mg Tabs (Glyburide-metformin) .Marland Kitchen..Marland Kitchen Two times a day    Ecotrin 325 Mg Tbec (Aspirin) ..... Once daily  Labs Reviewed: HgBA1c: 7.8 (11/14/2007)   Creat: 1.2 (11/14/2007)   Microalbumin: 0.7 (06/05/2007)  Orders: Venipuncture (13244) TLB-BMP (Basic Metabolic Panel-BMET) (80048-METABOL) TLB-A1C / Hgb A1C (Glycohemoglobin) (83036-A1C)   Problem # 4:  HYPERTENSION (ICD-401.9) Assessment: Unchanged  His updated medication list for this problem includes:    Benazepril-hydrochlorothiazide 20-12.5 Mg Tabs (Benazepril-hydrochlorothiazide) .Marland Kitchen..Marland Kitchen Two times a day  Orders: TLB-BMP (Basic Metabolic Panel-BMET) (80048-METABOL)  BP today: 110/70 Prior BP: 120/72 (02/13/2008)  Prior 10 Yr Risk Heart Disease: N/A (08/15/2007)  Labs Reviewed: Creat: 1.2 (11/14/2007) Chol: 130 (06/05/2007)   HDL: 40.4 (06/05/2007)   LDL: 72 (06/05/2007)   TG: 89 (06/05/2007)   Problem # 5:  HERNIA, UMBILICAL (ICD-553.1) Assessment: New new diagnosis  Complete Medication List: 1)  Benazepril-hydrochlorothiazide 20-12.5 Mg Tabs (Benazepril-hydrochlorothiazide) .... Two times a day 2)  Glucovance 5-500 Mg Tabs (Glyburide-metformin) .... Two times a day 3)  Nexium 40 Mg Cpdr (Esomeprazole magnesium) .... As needed 4)  Lovastatin 20 Mg Tabs (Lovastatin) .... Take 1 tablet by mouth once a day 5)  Ecotrin 325 Mg Tbec (Aspirin) .... Once daily 6)  Fish Oil Concentrate 1000 Mg Caps (Omega-3 fatty acids) .... One by mouth bid 7)  Vitamin C 500 Mg Chew (Ascorbic acid) .... Once daily 8)  Avodart 0.5 Mg Caps (Dutasteride)  Other Orders: TLB-Lipid Panel (80061-LIPID)  Hypertension Assessment/Plan:      The patient's  hypertensive risk group is category C: Target organ damage and/or diabetes.  Today's blood pressure is 110/70.  His blood pressure goal is < 130/80.   Patient Instructions: 1)  Please schedule a follow-up appointment in 3 months.   Prescriptions: NEXIUM 40 MG CPDR (ESOMEPRAZOLE MAGNESIUM) as needed  #30 x 11   Entered and Authorized by:   Stacie Glaze MD   Signed by:   Stacie Glaze MD on 08/21/2008   Method used:   Electronically to        [facility] [address] [phone]* (retail)       [address]       [place], [place]  [address]       Ph: [phone]       Fax: [phone]   RxID:   [id number] NEXIUM 40 MG CPDR (ESOMEPRAZOLE MAGNESIUM) as needed  #30 x 11   Entered by:   Willy Eddy, LPN   Authorized by:   Balinda Quails  Lovell Sheehan MD   Signed by:   Willy Eddy, LPN on 16/07/9603   Method used:   Electronically to        [facility] [address] [phone]* (retail)       [address]       [place], [place]  [address]       Ph: [phone]       Fax: [phone]   RxID:   [id number]  ]

## 2010-11-25 NOTE — Progress Notes (Signed)
Summary: Rx or OV for cough/cold symptoms  Phone Note Call from Patient Call back at Home Phone [phone]   Caller: Patient Call For: Stacie Glaze MD Summary of Call: Py requesting OV or RX for cold symptoms X 2 weeks, not getting any beter, scratchy throat, dry hacky cough keeping him awake at HS, post nasal drip.  No fever or headache.  Ptn has been using Robutussin DM with no relief NKDA [facility] [address] Initial call taken by: Sid Falcon LPN,  October 14, 2008 8:31 AM  Follow-up for Phone Call        [facility] AND [facility] Follow-up by: Stacie Glaze MD,  October 14, 2008 9:24 AM      Appended Document: Rx or OV for cough/cold symptoms Medications Added ZITHROMAX Z-PAK 250 MG TABS (AZITHROMYCIN) as directed HYDROMET 5-1.5 MG/5ML SYRP (HYDROCODONE-HOMATROPINE) one tsp every 6 hours as needed for cough          Clinical Lists Changes  Medications: Added new medication of ZITHROMAX Z-PAK 250 MG TABS (AZITHROMYCIN) as directed - Signed Added new medication of HYDROMET 5-1.5 MG/5ML SYRP (HYDROCODONE-HOMATROPINE) one tsp every 6 hours as needed for cough - Signed Rx of ZITHROMAX Z-PAK 250 MG TABS (AZITHROMYCIN) as directed;  #6 x 0;  Signed;  Entered by: Sid Falcon LPN;  Authorized by: Stacie Glaze MD;  Method used: Electronically to [facility] [address] [phone]*, [address], [place], [place]  [address], Ph: [phone], Fax: [phone] Rx of HYDROMET 5-1.5 MG/5ML SYRP (HYDROCODONE-HOMATROPINE) one tsp every 6 hours as needed for cough;  #6oz x 0;  Signed;  Entered by: Sid Falcon LPN;  Authorized by: Stacie Glaze MD;  Method used: Telephoned to [facility] [address] [phone]*, [address], [place], [place]  [address], Ph: [phone], Fax: [phone]    Prescriptions: HYDROMET 5-1.5 MG/5ML SYRP (HYDROCODONE-HOMATROPINE) one tsp every 6 hours as needed for cough  #6oz x 0   Entered by:   Sid Falcon LPN   Authorized by:   Stacie Glaze MD  Signed by:   Sid Falcon LPN on 38/75/6433   Method used:   Telephoned to ...       [facility]  [address] [phone]* (retail)       [address]       [place], [place]  [address]       Ph: [phone]       Fax: [phone]   RxID:   [id number] ZITHROMAX Z-PAK 250 MG TABS (AZITHROMYCIN) as directed  #6 x 0   Entered by:   Sid Falcon LPN   Authorized by:   Stacie Glaze MD   Signed by:   Sid Falcon LPN on 17/61/6073   Method used:   Electronically to        [facility] [address] [phone]* (retail)       [address]       [place], [place]  [address]       Ph: [phone]       Fax: [phone]   RxID:   [id number]  Rx sent electronically and called in, pt informed. Sid Falcon LPN  October 14, 2008 11:18 AM

## 2010-11-25 NOTE — Procedures (Signed)
Summary: Upper Endoscopy/[facility]  Upper Endoscopy/[facility]   Imported By: Sherian Rein 08/05/2009 08:09:38  _____________________________________________________________________  External Attachment:    Type:   Image     Comment:   External Document

## 2010-11-25 NOTE — Procedures (Signed)
Summary: EGD   EGD  Procedure date:  09/21/2004  Findings:      Location: [facility]  Angiodysplasia GERD  EGD  Procedure date:  09/21/2004  Findings:      Location: [facility]  Angiodysplasia GERD Patient Name: [name], [name] [name] MRN:  Procedure Procedures: Panendoscopy (EGD) CPT: 43235.  Personnel: Endoscopist: Wilhemina Bonito. Marina Goodell, MD.  Exam Location: Exam performed in Outpatient Clinic. Outpatient  Patient Consent: Procedure, Alternatives, Risks and Benefits discussed, consent obtained, from patient. Consent was obtained by the RN.  Indications Symptoms: Dyspepsia,  History  Current Medications: Patient is not currently taking Coumadin.  Pre-Exam Physical: Performed Aug 13, 2002  Entire physical exam was normal.  Exam Exam Info: Maximum depth of insertion Duodenum, intended Duodenum. Patient position: on left side. Vocal cords visualized. Gastric retroflexion performed. Images taken. ASA Classification: III. Tolerance: excellent.  Sedation Meds: Patient assessed and found to be appropriate for moderate (conscious) sedation. Fentanyl 50 mcg. given IV. Versed 5 mg. given IV. Cetacaine Spray given aerosolized.  Monitoring: BP and pulse monitoring done. Oximetry used. Supplemental O2 given  Findings Normal: Distal Esophagus.  HIATAL HERNIA:  NODULE: Maximum size: 9 mm. submucosal nodule in Fundus.  ANGIODYSPLASIA (AVMs): Total of 1 AVMs,  maximum size 2 mm, non- bleeding, in Duodenal Bulb. ICD9: Angiodysplasia without Hemorrhage: 569.84.   Assessment Abnormal examination, see findings above.  Diagnoses: 569.84: Angiodysplasia without Hemorrhage.  530.81: GERD.   Events  Unplanned Intervention: No unplanned interventions were required.  Unplanned Events: There were no complications. Plans Medication(s): PPI: Esomeprazole/Nexium 40 mg QD,   Disposition: After procedure patient sent to recovery. After recovery patient  sent home.  Scheduling: Follow-up prn.  Comments: RETURN TO [name] CARE OF [name]  This report was created from [name] original endoscopy report, which was reviewed and signed by [name] above listed endoscopist.   cc:  Darryll Capers, MD      Charlies Constable, MD      [name] Patient

## 2010-11-25 NOTE — Discharge Summary (Signed)
Summary: Discharge summary                 DATE OF BIRTH:  1920/12/29   DATE OF ADMISSION:  06/04/2002  DATE OF DISCHARGE:                                 DISCHARGE SUMMARY   REASON FOR EXTENSION OF DISCHARGE:  The patient had nausea, constipation and  was generally feeling fatigued with ambulation on Saturday, June 08, 2002.  His discharge which was originally planned for June 09, 2002,  postoperative day #4 was held for another day while bowel management was  effected.  In addition, serum blood glucose was obtained a.c. and h.s.  By  postoperative day #4 all of his oral hypoglycemic medications had been added  on.  He was on Actos 30 mg q.d. and Glucovance 5/500 twice daily.  In  addition he was eating, but sparingly, from a low sodium, low cholesterol  ADA diet. Mr. Ehrler was covered with each meal on postoperative day #4,  June 08, 2002 with sliding scale Humalog coverage but still, his serum  blood glucose on the morning of June 09, 2002 was 153 mg/dl and required  coverage.  By noon on June 09, 2002, his blood sugar was 211 mg/dl,  requiring 8 units of Humalog coverage.  It is clear that when Mr. Hoeffner  begins to pick up speed with his diet at home, even on his current oral  hypoglycemics and even with a moderate increase in his exercise, he will  still need some exogenous insulin.  It is recommended that he followup with  his primary care giver, Dr. Darryll Capers to address this.  By the time of  his discharge, Mr. Mathey was feeling much better.  He had adequate bowel  movements.  He was eating fairly well and his exercise tolerance had  improved.  He was ready for discharge on June 10, 2002, postoperative day  #5 after undergoing coronary artery bypass graft surgery x 6.                                               Maple Mirza, PA    GM/MEDQ  D:  06/09/2002  T:  06/12/2002  Job:  [id number]   cc:   Gwenith Daily. Tyrone Sage, M.D.   Lynford Humphrey, M.D.   Bruce Elvera Lennox  Juanda Chance, M.D. [facility]  [address]  [place]  [place] [address]

## 2010-11-25 NOTE — Assessment & Plan Note (Signed)
Summary: 3 MO ROV/MM   Vital Signs:  Patient profile:   77 year old male Height:      70 inches Weight:      203 pounds Temp:     98.2 degrees F oral Pulse rate:   72 / minute Resp:     14 per minute BP sitting:   132 / 72  (left arm)  Vitals Entered By: Willy Eddy, LPN (January 14, 2010 9:11 AM) CC: roa-htn/dm, Hypertension Management, Lipid Management   Primary Care Provider:  Peri Jefferson  CC:  roa-htn/dm, Hypertension Management, and Lipid Management.  History of Present Illness: reviewed the labs from the nov visit and the lipids were at goal reviewed the DM and the last a1c of 7.5 he self controls hypoglycemia and has been stable with medications  Hypertension History:      He denies headache, chest pain, palpitations, dyspnea with exertion, orthopnea, PND, peripheral edema, visual symptoms, neurologic problems, syncope, and side effects from treatment.        Positive major cardiovascular risk factors include male age 75 years old or older, diabetes, hyperlipidemia, and hypertension.  Negative major cardiovascular risk factors include non-tobacco-user status.        Positive history for target organ damage include ASHD (either angina/prior MI/prior CABG).  Further assessment for target organ damage reveals no history of stroke/TIA or peripheral vascular disease.    Lipid Management History:      Positive NCEP/ATP III risk factors include male age 75 years old or older, diabetes, hypertension, and ASHD (either angina/prior MI/prior CABG).  Negative NCEP/ATP III risk factors include non-tobacco-user status, no prior stroke/TIA, no peripheral vascular disease, and no history of aortic aneurysm.      Preventive Screening-Counseling & Management  Alcohol-Tobacco     Smoking Status: quit     Packs/Day: 1.0     Year Started: 1944     Year Quit: 1960  Problems Prior to Update: 1)  Chest Pain Unspecified  (ICD-786.50) 2)  Gerd  (ICD-530.81) 3)  Dyspepsia   (ICD-536.8) 4)  Hernia, Umbilical  (ICD-553.1) 5)  Hiatal Hernia With Reflux  (ICD-553.3) 6)  Degenerative Disc Disease, Cervical Spine  (ICD-722.4) 7)  Diabetes Mellitus, Type II  (ICD-250.00) 8)  Coronary Artery Disease  (ICD-414.00) 9)  Neoplasm, Malignant, Prostate  (ICD-185) 10)  Hypertension  (ICD-401.9) 11)  Hyperlipidemia  (ICD-272.4)  Medications Prior to Update: 1)  Benazepril-Hydrochlorothiazide 20-12.5 Mg Tabs (Benazepril-Hydrochlorothiazide) .... Two Times A Day 2)  Glucovance 5-500 Mg Tabs (Glyburide-Metformin) .... Two Times A Day 3)  Nexium 40 Mg Cpdr (Esomeprazole Magnesium) .... As Needed 4)  Lovastatin 20 Mg  Tabs (Lovastatin) .... Take 1 Tablet By Mouth Once A Day 5)  Ecotrin 325 Mg  Tbec (Aspirin) .... Once Daily 6)  Fish Oil Concentrate 1000 Mg  Caps (Omega-3 Fatty Acids) .... One By Mouth Bid 7)  Vitamin C 500 Mg  Chew (Ascorbic Acid) .... Once Daily 8)  Avodart 0.5 Mg Caps (Dutasteride) .Marland Kitchen.. 1 By Mouth Once Daily 9)  Vitamin E 1000 Unit Caps (Vitamin E) .... Take 1 Capsule By Mouth Once A Day 10)  Vitamin B-12 500 Mcg Tabs (Cyanocobalamin) .... Take 1 Tablet By Mouth Once A Day  Current Medications (verified): 1)  Benazepril-Hydrochlorothiazide 20-12.5 Mg Tabs (Benazepril-Hydrochlorothiazide) .... Two Times A Day 2)  Glucovance 5-500 Mg Tabs (Glyburide-Metformin) .... Two Times A Day 3)  Nexium 40 Mg Cpdr (Esomeprazole Magnesium) .... As Needed 4)  Lovastatin 20 Mg  Tabs (Lovastatin) .... Take 1 Tablet By Mouth Once A Day 5)  Ecotrin 325 Mg  Tbec (Aspirin) .... Once Daily 6)  Fish Oil Concentrate 1000 Mg  Caps (Omega-3 Fatty Acids) .... One By Mouth Bid 7)  Vitamin C 500 Mg  Chew (Ascorbic Acid) .... Once Daily 8)  Avodart 0.5 Mg Caps (Dutasteride) .Marland Kitchen.. 1 By Mouth Once Daily 9)  Vitamin E 1000 Unit Caps (Vitamin E) .... Take 1 Capsule By Mouth Once A Day 10)  Vitamin B-12 500 Mcg Tabs (Cyanocobalamin) .... Take 1 Tablet By Mouth Once A Day  Allergies  (verified): No Known Drug Allergies  Past History:  Family History: Last updated: 08/04/2009 Family History of Diabetes:  Family History of Heart Disease:   Social History: Last updated: 08/04/2009 Retired divorced Former Smoker 4 children Daily Caffeine Use Illicit Drug Use - no  Risk Factors: Exercise: no (08/15/2007)  Risk Factors: Smoking Status: quit (01/14/2010) Packs/Day: 1.0 (01/14/2010)  Past medical, surgical, family and social histories (including risk factors) reviewed, and no changes noted (except as noted below).  Past Medical History: Reviewed history from 08/31/2009 and no changes required. Hyperlipidemia Hypertension elevated PSA, hx of Coronary artery disease Diabetes mellitus, type II GERD Hemorrhoids Adenomatous Colon Polyps Arthritis Coronary artery disease status post diaphragmatic wall infarction     and subsequent coronary artery bypass graft surgery in 2003, now     stable.  prostate cancer.   Past Surgical History: Reviewed history from 07/30/2009 and no changes required. CABG   Family History: Reviewed history from 08/04/2009 and no changes required. Family History of Diabetes:  Family History of Heart Disease:   Social History: Reviewed history from 08/04/2009 and no changes required. Retired divorced Former Smoker 4 children Daily Caffeine Use Illicit Drug Use - no  Review of Systems  The patient denies anorexia, fever, weight loss, weight gain, vision loss, decreased hearing, hoarseness, chest pain, syncope, dyspnea on exertion, peripheral edema, prolonged cough, headaches, hemoptysis, abdominal pain, melena, hematochezia, severe indigestion/heartburn, hematuria, incontinence, genital sores, muscle weakness, suspicious skin lesions, transient blindness, difficulty walking, depression, unusual weight change, abnormal bleeding, enlarged lymph nodes, angioedema, breast masses, and testicular masses.    Physical  Exam  General:  Well-developed,well-nourished,in no acute distress; alert,appropriate and cooperative throughout examination Head:  male-pattern balding.   Ears:  R ear normal and L ear normal.   Nose:  no external deformity.  no nasal discharge.   Mouth:  pharynx pink and moist.   Neck:  supple and decreased ROM.   Lungs:  Normal respiratory effort, chest expands symmetrically. Lungs are clear to auscultation, no crackles or wheezes.  Heart:  normal rate and regular rhythm.   Abdomen:  soft, non-tender, and normal bowel sounds.   Msk:  normal ROM, no joint swelling, and no joint warmth.   Extremities:  trace left pedal edema and trace right pedal edema.   Neurologic:  alert & oriented X3 and finger-to-nose normal.     Impression & Recommendations:  Problem # 1:  DYSPEPSIA (ICD-536.8)  stable  Orders: Venipuncture (16109) TLB-CBC Platelet - w/Differential (85025-CBCD)  Problem # 2:  CORONARY ARTERY DISEASE (ICD-414.00)  His updated medication list for this problem includes:    Benazepril-hydrochlorothiazide 20-12.5 Mg Tabs (Benazepril-hydrochlorothiazide) .Marland Kitchen..Marland Kitchen Two times a day    Ecotrin 325 Mg Tbec (Aspirin) ..... Once daily  Labs Reviewed: Chol: 155 (09/15/2009)   HDL: 48.40 (09/15/2009)   LDL: 89 (09/15/2009)   TG: 89.0 (09/15/2009)  Lipid Goals: Chol Goal: 200 (  01/14/2010)   HDL Goal: 40 (01/14/2010)   LDL Goal: 70 (01/14/2010)   TG Goal: 150 (01/14/2010)  Problem # 3:  DIABETES MELLITUS, TYPE II (ICD-250.00)  His updated medication list for this problem includes:    Benazepril-hydrochlorothiazide 20-12.5 Mg Tabs (Benazepril-hydrochlorothiazide) .Marland Kitchen..Marland Kitchen Two times a day    Glucovance 5-500 Mg Tabs (Glyburide-metformin) .Marland Kitchen..Marland Kitchen Two times a day    Ecotrin 325 Mg Tbec (Aspirin) ..... Once daily  Labs Reviewed: Creat: 1.0 (09/15/2009)    Reviewed HgBA1c results: 7.5 (09/15/2009)  7.6 (05/12/2009)  Orders: TLB-A1C / Hgb A1C (Glycohemoglobin) (83036-A1C)  Complete  Medication List: 1)  Benazepril-hydrochlorothiazide 20-12.5 Mg Tabs (Benazepril-hydrochlorothiazide) .... Two times a day 2)  Glucovance 5-500 Mg Tabs (Glyburide-metformin) .... Two times a day 3)  Nexium 40 Mg Cpdr (Esomeprazole magnesium) .... As needed 4)  Lovastatin 20 Mg Tabs (Lovastatin) .... Take 1 tablet by mouth once a day 5)  Ecotrin 325 Mg Tbec (Aspirin) .... Once daily 6)  Fish Oil Concentrate 1000 Mg Caps (Omega-3 fatty acids) .... One by mouth bid 7)  Vitamin C 500 Mg Chew (Ascorbic acid) .... Once daily 8)  Avodart 0.5 Mg Caps (Dutasteride) .Marland Kitchen.. 1 by mouth once daily 9)  Vitamin E 1000 Unit Caps (Vitamin e) .... Take 1 capsule by mouth once a day 10)  Vitamin B-12 500 Mcg Tabs (Cyanocobalamin) .... Take 1 tablet by mouth once a day  Other Orders: TLB-BMP (Basic Metabolic Panel-BMET) (80048-METABOL)  Hypertension Assessment/Plan:      The patient's hypertensive risk group is category C: Target organ damage and/or diabetes.  Today's blood pressure is 132/72.  His blood pressure goal is < 130/80.  Lipid Assessment/Plan:      Based on NCEP/ATP III, the patient's risk factor category is "history of coronary disease, peripheral vascular disease, cerebrovascular disease, or aortic aneurysm along with either diabetes, current smoker, or LDL > 130 plus HDL < 40 plus triglycerides > 200".  The patient's lipid goals are as follows: Total cholesterol goal is 200; LDL cholesterol goal is 70; HDL cholesterol goal is 40; Triglyceride goal is 150.  His LDL cholesterol goal has been met.    Patient Instructions: 1)  sept CPX with labs prior

## 2010-11-25 NOTE — Assessment & Plan Note (Signed)
Summary: 3 mntr hroa-smm rsc appt time 12.45p/njr   Vital Signs:  Patient Profile:   77 Years Old Male Height:     70 inches Weight:      204 pounds Temp:     98.5 degrees F oral Pulse rate:   76 / minute Resp:     14 per minute BP sitting:   120 / 70  (left arm)  Vitals Entered By: Willy Eddy, LPN (November 14, 2007 12:36 PM)                 Chief Complaint:  roa.  History of Present Illness: for several months has been having soreness in his back with no constipation or blood in urine no usuual physical activitiy PSA was elevated and Dr Earlene Plater gave his some antibiotic... the back may be related to this.... pt was unaware of the relationship... Current Problems:  DIABETES MELLITUS, TYPE II (ICD-250.00)   good CBG's CORONARY ARTERY DISEASE (ICD-414.00)  no chest pain PSA, INCREASED (ICD-790.93)  seein Dr Onalee Hua reported to have increased to over 20 HYPERTENSION (ICD-401.9)  stable HYPERLIPIDEMIA (ICD-272.4)     Diabetes Management History:      The patient is an 77 years old male who comes in for evaluation of DM Type 2.  He has not been enrolled in the "Diabetic Education Program".  He states understanding of dietary principles and is following his diet appropriately.  No sensory loss is reported.  He is checking home blood sugars.  He says that he is not exercising regularly.    Hypertension History:      He denies headache, chest pain, palpitations, dyspnea with exertion, orthopnea, PND, peripheral edema, visual symptoms, neurologic problems, syncope, and side effects from treatment.        Positive major cardiovascular risk factors include male age 32 years old or older, diabetes, hyperlipidemia, and hypertension.  Negative major cardiovascular risk factors include non-tobacco-user status.        Positive history for target organ damage include ASHD (either angina; prior MI; prior CABG).      Current Allergies (reviewed today): No known allergies   Updated/Current Medications (including changes made in today's visit):  BENAZEPRIL-HYDROCHLOROTHIAZIDE 20-12.5 MG TABS (BENAZEPRIL-HYDROCHLOROTHIAZIDE) two times a day GLUCOVANCE 5-500 MG TABS (GLYBURIDE-METFORMIN) two times a day NEXIUM 40 MG CPDR (ESOMEPRAZOLE MAGNESIUM) as needed LOVASTATIN 20 MG  TABS (LOVASTATIN) Take 1 tablet by mouth once a day ECOTRIN 325 MG  TBEC (ASPIRIN) once daily FISH OIL CONCENTRATE 1000 MG  CAPS (OMEGA-3 FATTY ACIDS) one by mouth bid VITAMIN C 500 MG  CHEW (ASCORBIC ACID) once daily   Past Medical History:    Reviewed history from 06/04/2007 and no changes required:       Hyperlipidemia       Hypertension       elevated PSA, hx of       Coronary artery disease       Diabetes mellitus, type II   Social History:    Reviewed history from 06/12/2007 and no changes required:       Retired       Married       Former Smoker    Review of Systems  The patient denies anorexia, fever, weight loss, vision loss, decreased hearing, hoarseness, chest pain, syncope, dyspnea on exhertion, peripheral edema, prolonged cough, hemoptysis, abdominal pain, melena, hematochezia, severe indigestion/heartburn, difficulty walking, depression, unusual weight change, abnormal bleeding, enlarged lymph nodes, angioedema, breast masses, and testicular masses.  Physical Exam  General:     Well-developed,well-nourished,in no acute distress; alert,appropriate and cooperative throughout examination Head:     male-pattern balding.   Eyes:     pupils equal and pupils round.   Ears:     R ear normal and L ear normal.   Nose:     no external deformity.   Mouth:     pharynx pink and moist.   Neck:     No deformities, masses, or tenderness noted. Lungs:     Normal respiratory effort, chest expands symmetrically. Lungs are clear to auscultation, no crackles or wheezes. Heart:     normal rate and regular rhythm.   Abdomen:     soft, non-tender, and normal bowel sounds.    Msk:     lumbar lordosis and SI joint tenderness.      Impression & Recommendations:  Problem # 1:  CORONARY ARTERY DISEASE (ICD-414.00) Assessment: Unchanged  His updated medication list for this problem includes:    Benazepril-hydrochlorothiazide 20-12.5 Mg Tabs (Benazepril-hydrochlorothiazide) .Marland Kitchen..Marland Kitchen Two times a day    Ecotrin 325 Mg Tbec (Aspirin) ..... Once daily  Labs Reviewed: Chol: 130 (06/05/2007)   HDL: 40.4 (06/05/2007)   LDL: 72 (06/05/2007)   TG: 89 (06/05/2007)   Problem # 2:  PSA, INCREASED (ICD-790.93) significant spike in PSA seeing urologist  Problem # 3:  DIABETES MELLITUS, TYPE II (ICD-250.00)  His updated medication list for this problem includes:    Benazepril-hydrochlorothiazide 20-12.5 Mg Tabs (Benazepril-hydrochlorothiazide) .Marland Kitchen..Marland Kitchen Two times a day    Glucovance 5-500 Mg Tabs (Glyburide-metformin) .Marland Kitchen..Marland Kitchen Two times a day    Ecotrin 325 Mg Tbec (Aspirin) ..... Once daily  Orders: TLB-A1C / Hgb A1C (Glycohemoglobin) (83036-A1C)   Complete Medication List: 1)  Benazepril-hydrochlorothiazide 20-12.5 Mg Tabs (Benazepril-hydrochlorothiazide) .... Two times a day 2)  Glucovance 5-500 Mg Tabs (Glyburide-metformin) .... Two times a day 3)  Nexium 40 Mg Cpdr (Esomeprazole magnesium) .... As needed 4)  Lovastatin 20 Mg Tabs (Lovastatin) .... Take 1 tablet by mouth once a day 5)  Ecotrin 325 Mg Tbec (Aspirin) .... Once daily 6)  Fish Oil Concentrate 1000 Mg Caps (Omega-3 fatty acids) .... One by mouth bid 7)  Vitamin C 500 Mg Chew (Ascorbic acid) .... Once daily  Other Orders: TLB-BMP (Basic Metabolic Panel-BMET) (80048-METABOL)  Diabetes Management Assessment/Plan:      The following lipid goals have been established for the patient: Total cholesterol goal of 200; LDL cholesterol goal of 100; HDL cholesterol goal of 40; Triglyceride goal of 200.  His blood pressure goal is < 130/80.    Hypertension Assessment/Plan:      The patient's hypertensive risk group  is category C: Target organ damage and/or diabetes.  Today's blood pressure is 120/70.  His blood pressure goal is < 130/80.   Patient Instructions: 1)  Please schedule a follow-up appointment in 3 months.    Prescriptions: LOVASTATIN 20 MG  TABS (LOVASTATIN) Take 1 tablet by mouth once a day  #90 x 3   Entered and Authorized by:   Stacie Glaze MD   Signed by:   Stacie Glaze MD on 11/14/2007   Method used:   Print then Give to Patient   RxID:   [id number] GLUCOVANCE 5-500 MG TABS (GLYBURIDE-METFORMIN) two times a day  #180 x 3   Entered and Authorized by:   Stacie Glaze MD   Signed by:   Stacie Glaze MD on 11/14/2007   Method used:   Print  then Give to Patient   RxID:   [id number] BENAZEPRIL-HYDROCHLOROTHIAZIDE 20-12.5 MG TABS (BENAZEPRIL-HYDROCHLOROTHIAZIDE) two times a day  #180 x 3   Entered and Authorized by:   Stacie Glaze MD   Signed by:   Stacie Glaze MD on 11/14/2007   Method used:   Print then Give to Patient   RxID:   [id number] LOVASTATIN 20 MG  TABS (LOVASTATIN) Take 1 tablet by mouth once a day  #90 x 3   Entered by:   Willy Eddy, LPN   Authorized by:   Stacie Glaze MD   Signed by:   Willy Eddy, LPN on 63/87/5643   Method used:   Print then Give to Patient   RxID:   [id number] GLUCOVANCE 5-500 MG TABS (GLYBURIDE-METFORMIN) two times a day  #90 x 3   Entered by:   Willy Eddy, LPN   Authorized by:   Stacie Glaze MD   Signed by:   Willy Eddy, LPN on 60/07/9322   Method used:   Print then Give to Patient   RxID:   [id number] BENAZEPRIL-HYDROCHLOROTHIAZIDE 20-12.5 MG TABS (BENAZEPRIL-HYDROCHLOROTHIAZIDE) two times a day  #90 x 3   Entered by:   Willy Eddy, LPN   Authorized by:   Stacie Glaze MD   Signed by:   Willy Eddy, LPN on 76/28/3151   Method used:   Print then Give to Patient   RxID:   [id number]  ]

## 2010-11-25 NOTE — Progress Notes (Signed)
Summary: pt wanting Nexium sent to [facility]  Phone Note Call from Patient Call back at Home Phone [phone]   Caller: Patient Call For: Stacie Glaze MD Summary of Call: Patient called and said that [name] was suppose to authorize Nexium to [facility]. Their fax # is [phone] if you have any questions you can call [facility] at [phone].  Initial call taken by: Lucy Antigua,  May 28, 2009 12:30 PM  Follow-up for Phone Call        Rx done 05/12/09 to wrong pharmacy so redone electronically.Patient notified.  Follow-up by: Gladis Riffle, RN,  May 28, 2009 2:22 PM    New/Updated Medications: NEXIUM 40 MG CPDR (ESOMEPRAZOLE MAGNESIUM) as needed Prescriptions: NEXIUM 40 MG CPDR (ESOMEPRAZOLE MAGNESIUM) as needed  #90 x 3   Entered by:   Gladis Riffle, RN   Authorized by:   Stacie Glaze MD   Signed by:   Gladis Riffle, RN on 05/28/2009   Method used:   Electronically to        [facility] (retail)       [address] [place], [place]  [address]       Ph: [phone]       Fax: [phone]   RxID:   [id number]

## 2010-11-25 NOTE — Letter (Signed)
Summary: Medical Necessity for Diabetic Supplies  Medical Necessity for Diabetic Supplies   Imported By: Maryln Gottron 03/15/2010 12:48:05  _____________________________________________________________________  External Attachment:    Type:   Image     Comment:   External Document

## 2010-11-25 NOTE — Assessment & Plan Note (Signed)
Summary: [name]   Vital Signs:  Patient Profile:   77 Years Old Male Height:     70 inches Weight:      206 pounds Temp:     98.1 degrees F oral Pulse rate:   76 / minute Resp:     14 per minute BP sitting:   132 / 80  (left arm)  Vitals Entered By: Willy Eddy, LPN (June 12, 2007 9:11 AM)               Chief Complaint:  [name]/labs drawn last week and Type 2 diabetes mellitus follow-up.  History of Present Illness: QUIT THE ACTOS DUE TO FEARS OF SIDE EFFECTS  Type 2 Diabetes Mellitus Follow-Up      This is an 77 year old man who presents for Type 2 diabetes mellitus follow-up.  The patient denies polyuria, polydipsia, blurred vision, self managed hypoglycemia, hypoglycemia requiring help, weight loss, weight gain, and numbness of extremities.  The patient denies the following symptoms: neuropathic pain, chest pain, orthostatic symptoms, poor wound healing, and intermittent claudication.  Since the last visit the patient reports good dietary compliance.  The patient has been measuring capillary blood glucose before breakfast.  Since the last visit, the patient reports having had eye care by an ophthalmologist.    Hypertension Follow-Up      The patient also presents for Hypertension follow-up.  The patient denies lightheadedness, urinary frequency, headaches, and fatigue.  Associated symptoms include pedal edema.  The patient denies the following associated symptoms: chest pain, chest pressure, exercise intolerance, and leg edema.  Compliance with medications (by patient report) has been near 100%.  The patient reports that dietary compliance has been good.  The patient reports no exercise.  Adjunctive measures currently used by the patient include salt restriction.    Current Allergies: No known allergies   Past Medical History:    Reviewed history from 06/04/2007 and no changes required:       Hyperlipidemia       Hypertension       elevated PSA, hx of       Coronary  artery disease       Diabetes mellitus, type II   Social History:    Retired    Married    Former Smoker   Risk Factors:  Tobacco use:  quit   Review of Systems  The patient denies anorexia, weight loss, decreased hearing, chest pain, dyspnea on exhertion, abdominal pain, melena, hematochezia, severe indigestion/heartburn, muscle weakness, suspicious skin lesions, difficulty walking, unusual weight change, abnormal bleeding, and enlarged lymph nodes.     Physical Exam  General:     elderly African-American male, in no apparent distress Head:     Normocephalic and atraumatic without obvious abnormalities. No apparent alopecia or balding. Ears:     External ear exam shows no significant lesions or deformities.  Otoscopic examination reveals clear canals, tympanic membranes are intact bilaterally without bulging, retraction, inflammation or discharge. Hearing is grossly normal bilaterally. Nose:     no external deformity.   Mouth:     pharynx pink and moist.   Neck:     supple.   Lungs:     Longfellow exam revealed normal breath sounds bilaterally without fremitus crackles or wheezes with slight decreased breath sounds in the base Heart:     normal rate and regular rhythm.   Abdomen:     soft nontender, no masses, guarding, rigidity, or rebound tenderness Msk:  no joint tenderness and no joint swelling.   Pulses:     pulses were plus two in the upper extremity and plus one in the lower extremity Extremities:     trace left pedal edema and trace right pedal edema.   Cervical Nodes:     No lymphadenopathy noted Axillary Nodes:     No palpable lymphadenopathy Psych:     Oriented X3.      Impression & Recommendations:  Problem # 1:  DIABETES MELLITUS, TYPE II (ICD-250.00) ADDED JANUVIA His updated medication list for this problem includes:    Benazepril-hydrochlorothiazide 20-12.5 Mg Tabs (Benazepril-hydrochlorothiazide) .Marland Kitchen..Marland Kitchen Two times a day    Glucovance  5-500 Mg Tabs (Glyburide-metformin) .Marland Kitchen..Marland Kitchen Two times a day    Ecotrin 325 Mg Tbec (Aspirin) ..... Once daily    Januvia 100 Mg Tabs (Sitagliptin phosphate) ..... One by mouth daily  Labs Reviewed: HgBA1c: 7.4 (06/05/2007)   Creat: 0.9 (06/05/2007)      Problem # 2:  CORONARY ARTERY DISEASE (ICD-414.00) ON FISH OIL His updated medication list for this problem includes:    Benazepril-hydrochlorothiazide 20-12.5 Mg Tabs (Benazepril-hydrochlorothiazide) .Marland Kitchen..Marland Kitchen Two times a day    Ecotrin 325 Mg Tbec (Aspirin) ..... Once daily  Labs Reviewed: Chol: 130 (06/05/2007)   HDL: 40.4 (06/05/2007)   LDL: 72 (06/05/2007)   TG: 89 (06/05/2007)   Problem # 3:  PSA, INCREASED (ICD-790.93) Assessment: Unchanged  Problem # 4:  HYPERTENSION (ICD-401.9)  His updated medication list for this problem includes:    Benazepril-hydrochlorothiazide 20-12.5 Mg Tabs (Benazepril-hydrochlorothiazide) .Marland Kitchen..Marland Kitchen Two times a day  BP today: 132/80  Labs Reviewed: Creat: 0.9 (06/05/2007) Chol: 130 (06/05/2007)   HDL: 40.4 (06/05/2007)   LDL: 72 (06/05/2007)   TG: 89 (06/05/2007)   Complete Medication List: 1)  Benazepril-hydrochlorothiazide 20-12.5 Mg Tabs (Benazepril-hydrochlorothiazide) .... Two times a day 2)  Glucovance 5-500 Mg Tabs (Glyburide-metformin) .... Two times a day 3)  Nexium 40 Mg Cpdr (Esomeprazole magnesium) .... As needed 4)  Lovastatin 20 Mg Tabs (Lovastatin) .... Take 1 tablet by mouth once a day 5)  Ecotrin 325 Mg Tbec (Aspirin) .... Once daily 6)  Fish Oil 300 Mg Caps (Omega-3 fatty acids) .... Once daily 7)  Vitamin C 500 Mg Chew (Ascorbic acid) .... Once daily 8)  Januvia 100 Mg Tabs (Sitagliptin phosphate) .... One by mouth daily   Patient Instructions: 1)  Please schedule a follow-up IN 2 MONTHS 2)  HbgA1C prior to visit, ICD-9:  250.02 3)  It is important that you exercise regularly at least 20 minutes 5 times a week. If you develop chest pain, have severe difficulty breathing, or feel  very tired , stop exercising immediately and seek medical attention.    Prescriptions: JANUVIA 100 MG  TABS (SITAGLIPTIN PHOSPHATE) ONE by mouth DAILY  #306 x 0   Entered and Authorized by:   Stacie Glaze MD   Signed by:   Stacie Glaze MD on 06/12/2007   Method used:   Historical   RxID:   [id number]

## 2010-11-25 NOTE — Consult Note (Signed)
Summary: [facility]  [facility]   Imported By: Maryln Gottron 03/24/2008 15:52:00  _____________________________________________________________________  External Attachment:    Type:   Image     Comment:   External Document

## 2010-11-25 NOTE — Progress Notes (Signed)
Summary: Pt called to check on status of order for diabetic supplies  Phone Note Call from Patient Call back at Home Phone [phone]   Caller: Patient Reason for Call: Acute Illness Summary of Call: Pt said that a diabetic supply company sent a form to [name] for him to complete and fax back, so the patient can get his diabetic supplies sent to his home. Please call pt with status.  Initial call taken by: Lucy Antigua,  Mar 09, 2010 2:50 PM  Follow-up for Phone Call        I think that was faxed back last week if you will let pt know Follow-up by: Willy Eddy, LPN,  Mar 09, 2010 2:53 PM  Additional Follow-up for Phone Call Additional follow up Details #1::        Pt walked in and want the order to be changed to 3x a day inwhich he clearly stated repeatedly. he will call the supply company to send a new form for his test strips. Any ? please call pt. Thanks. Additional Follow-up by: Warnell Forester,  Mar 09, 2010 3:38 PM    New/Updated Medications: FREESTYLE LITE TEST  STRP (GLUCOSE BLOOD) three times a day

## 2010-11-26 NOTE — Procedures (Signed)
Summary: [facility]  [facility]   Imported By: Sherian Rein 08/05/2009 08:12:29  _____________________________________________________________________  External Attachment:    Type:   Image     Comment:   External Document

## 2011-01-06 ENCOUNTER — Encounter: Payer: Self-pay | Admitting: *Deleted

## 2011-01-06 ENCOUNTER — Encounter: Payer: Self-pay | Admitting: Internal Medicine

## 2011-01-10 ENCOUNTER — Telehealth: Payer: Self-pay | Admitting: Internal Medicine

## 2011-01-27 ENCOUNTER — Ambulatory Visit: Payer: Self-pay | Admitting: Internal Medicine

## 2011-01-27 LAB — GLUCOSE, CAPILLARY
Glucose-Capillary: 82 mg/dL (ref 70–99)
Glucose-Capillary: 96 mg/dL (ref 70–99)

## 2011-02-04 ENCOUNTER — Encounter: Payer: Self-pay | Admitting: Internal Medicine

## 2011-02-07 ENCOUNTER — Telehealth: Payer: Self-pay | Admitting: Internal Medicine

## 2011-02-07 DIAGNOSIS — E119 Type 2 diabetes mellitus without complications: Secondary | ICD-10-CM

## 2011-02-07 MED ORDER — GLUCOSE BLOOD VI STRP: ORAL_STRIP | Status: AC

## 2011-02-07 NOTE — Telephone Encounter (Signed)
Needs new rx for True Read test strips to [facility]----[address]    Needs #300 test strips.

## 2011-02-07 NOTE — Telephone Encounter (Signed)
Done

## 2011-02-14 ENCOUNTER — Encounter: Payer: Self-pay | Admitting: Internal Medicine

## 2011-02-14 ENCOUNTER — Ambulatory Visit (INDEPENDENT_AMBULATORY_CARE_PROVIDER_SITE_OTHER): Payer: Medicare HMO | Admitting: Internal Medicine

## 2011-02-14 VITALS — BP 130/70 | HR 76 | Temp 98.2°F | Resp 16 | Ht 70.0 in | Wt 204.0 lb

## 2011-02-14 DIAGNOSIS — E785 Hyperlipidemia, unspecified: Secondary | ICD-10-CM

## 2011-02-14 DIAGNOSIS — I10 Essential (primary) hypertension: Secondary | ICD-10-CM

## 2011-02-14 DIAGNOSIS — I2510 Atherosclerotic heart disease of native coronary artery without angina pectoris: Secondary | ICD-10-CM

## 2011-02-14 DIAGNOSIS — K5900 Constipation, unspecified: Secondary | ICD-10-CM

## 2011-02-14 DIAGNOSIS — E119 Type 2 diabetes mellitus without complications: Secondary | ICD-10-CM

## 2011-02-14 LAB — CBC WITH DIFFERENTIAL/PLATELET
Basophils Absolute: 0 10*3/uL (ref 0.0–0.1)
Basophils Relative: 0.3 % (ref 0.0–3.0)
Eosinophils Absolute: 0.1 10*3/uL (ref 0.0–0.7)
Eosinophils Relative: 1.9 % (ref 0.0–5.0)
HCT: 36.5 % — ABNORMAL LOW (ref 39.0–52.0)
Hemoglobin: 12.3 g/dL — ABNORMAL LOW (ref 13.0–17.0)
Lymphocytes Relative: 26.9 % (ref 12.0–46.0)
Lymphs Abs: 2 10*3/uL (ref 0.7–4.0)
MCHC: 33.6 g/dL (ref 30.0–36.0)
MCV: 93.3 fl (ref 78.0–100.0)
Monocytes Absolute: 0.7 10*3/uL (ref 0.1–1.0)
Monocytes Relative: 9.1 % (ref 3.0–12.0)
Neutro Abs: 4.5 10*3/uL (ref 1.4–7.7)
Neutrophils Relative %: 61.8 % (ref 43.0–77.0)
Platelets: 309 10*3/uL (ref 150.0–400.0)
RBC: 3.91 Mil/uL — ABNORMAL LOW (ref 4.22–5.81)
RDW: 14.7 % — ABNORMAL HIGH (ref 11.5–14.6)
WBC: 7.3 10*3/uL (ref 4.5–10.5)

## 2011-02-14 LAB — HEMOGLOBIN A1C: Hgb A1c MFr Bld: 8.6 % — ABNORMAL HIGH (ref 4.6–6.5)

## 2011-02-14 MED ORDER — LACTULOSE 20 GM/30ML PO SOLN: 15.0000 mL | Freq: Every day | ORAL | Status: AC

## 2011-02-14 NOTE — Progress Notes (Signed)
  Subjective:    Patient ID: [name], male    DOB: 28-Apr-1921, 77 y.o.   MRN: [id number]  HPI   Constipation... he is not on a daily laxative but has used mag citrate intermittently for constipation when it becomes severe and his risk factors for constipation include his diabetes and his hypertension and his age. He feels his blood sugars have been running well. Usually in the 100-120 range   Review of Systems  Constitutional: Negative for fever and fatigue.  HENT: Negative for hearing loss, congestion, neck pain and postnasal drip.   Eyes: Negative for discharge, redness and visual disturbance.  Respiratory: Negative for cough, shortness of breath and wheezing.   Cardiovascular: Negative for leg swelling.  Gastrointestinal: Positive for abdominal pain and abdominal distention. Negative for constipation.  Genitourinary: Negative for urgency and frequency.  Musculoskeletal: Negative for joint swelling and arthralgias.  Skin: Negative for color change and rash.  Neurological: Negative for weakness and light-headedness.  Hematological: Negative for adenopathy.  Psychiatric/Behavioral: Negative for behavioral problems.   Past Medical History  Diagnosis Date  . Hyperlipidemia   . Hypertension   . Elevated PSA   . CAD (coronary artery disease)   . Diabetes mellitus   . GERD (gastroesophageal reflux disease)   . Hemorrhoids   . Hx of adenomatous colonic polyps   . Arthritis   . CAD (coronary artery disease)     post diaphragjatic wall infarction and subsequent coronary artery bypass graft surgery in 2003, now stable  . Prostate cancer    Past Surgical History  Procedure Date  . Coronary artery bypass graft     reports that he quit smoking about 62 years ago. He does not have any smokeless tobacco history on file. He reports that he does not drink alcohol or use illicit drugs. family history includes Diabetes in an unspecified family member and Heart disease in an  unspecified family member. Allergies not on file     Objective:   Physical Exam  Constitutional: He appears well-developed and well-nourished.  HENT:  Head: Normocephalic and atraumatic.  Eyes: Conjunctivae are normal. Pupils are equal, round, and reactive to light.  Neck: Normal range of motion. Neck supple.  Cardiovascular: Normal rate and regular rhythm.   Pulmonary/Chest: Effort normal and breath sounds normal.  Abdominal: Soft. Bowel sounds are normal. He exhibits distension. There is tenderness.          Assessment & Plan:

## 2011-02-14 NOTE — Assessment & Plan Note (Signed)
Suffers from constipation and sometimes will go up a week without a bowel movement for which he takes some exudate and receive some relief but he does not have daily or every other day bowel movements this is consistent with mild gastroparesis from her diabetes

## 2011-02-16 ENCOUNTER — Encounter: Payer: Self-pay | Admitting: Internal Medicine

## 2011-02-16 NOTE — Assessment & Plan Note (Signed)
He reports no chest pain or change in exertional shortness of breath

## 2011-02-16 NOTE — Assessment & Plan Note (Signed)
The patient's CBG showed a fasting glucose of 102 he states that his blood glucose has been in excellent control with fingersticks in the normal range

## 2011-02-16 NOTE — Assessment & Plan Note (Signed)
He is on lovastatin generic 20 mg at bedtime monitoring of his lipids with the last 7 months shows excellent results

## 2011-02-16 NOTE — Assessment & Plan Note (Signed)
Blood pressure is well-controlled on the combination of a diuretic and an ACE inhibitor renal function is stable and no side effects were reported

## 2011-03-04 ENCOUNTER — Telehealth: Payer: Self-pay | Admitting: *Deleted

## 2011-03-04 MED ORDER — AZITHROMYCIN 250 MG PO TABS: 250.0000 mg | ORAL_TABLET | Freq: Every day | ORAL | Status: AC

## 2011-03-04 NOTE — Telephone Encounter (Signed)
Per dr Yvonne Kendall have z pack and mucinex fast max

## 2011-03-04 NOTE — Telephone Encounter (Signed)
Pt notified and meds called in.  

## 2011-03-04 NOTE — Telephone Encounter (Signed)
Pt is calling complaining of productive cough, sinus congestion and pain with weakness x 3 days.  No temp.

## 2011-03-08 NOTE — Assessment & Plan Note (Signed)
[facility]                            CARDIOLOGY OFFICE NOTE   NAME:Starkey, RAGE BEEVER                    MRN:          [id number]  DATE:08/28/2007                            DOB:          07-24-1921    PRIMARY CARE PHYSICIAN:  Dr. Darryll Capers.   CLINICAL COURSE:  Mr. Pelzer is 77 years old and had a diaphragmatic wall  infarction in 2003 and subsequent bypass surgery in 2003.  He has good  left ventricular function.  He has been doing quite well from a cardiac  standpoint with no symptoms of chest pain, shortness of breath, or  palpitations.   PAST MEDICAL HISTORY:  1. Diabetes.  2. Hyperlipidemia.  3. Hypertension.   CURRENT MEDICATIONS:  1. Glyburide.  2. Aspirin.  3. Lovastatin.  4. Benazepril/hydrochlorothiazide.  5. Omega-3.   PHYSICAL EXAMINATION:  The blood pressure was 141/76 and the pulse 62  and regular.  There was no venous distension.  The carotid pulses were full and there  were no bruits.  The chest was clear without rales or rhonchi.  Cardiac rhythm was regular, I could hear no murmurs or gallops.  The abdomen was soft with normal bowel sounds.  Peripheral pulses were full and there is no peripheral edema.   An electrocardiogram showed minor, nonspecific ST-T changes.   IMPRESSION:  1. Coronary artery disease, status post prior diaphragmatic wall      infarction and prior bypass surgery 2003, now stable.  2. Good left ventricular function.  3. Hypertension, now under better control.  4. Hyperlipidemia.  5. Diabetes.   RECOMMENDATIONS:  I think Mr. Weekes is doing quite well.  [name]  has recently checked on his cholesterol and diabetes.  Will plan to  continue his current medications and see him back in cardiac followup in  a year.     Bruce Elvera Lennox Juanda Chance, MD, [facility]  Electronically Signed    BRB/MedQ  DD: 08/28/2007  DT: 08/28/2007  Job #: [id number]

## 2011-03-08 NOTE — Assessment & Plan Note (Signed)
[name]                            CARDIOLOGY OFFICE NOTE   NAME:[name], [name] VALBUENA                    MRN:          [id number]  DATE:08/28/2008                            DOB:          12/14/1920    PRIMARY CARE PHYSICIAN:  Stacie Glaze, MD   CLINICAL HISTORY:  [name] is 77 years old and returns for followup  management of his coronary heart disease.  He had a diaphragmatic wall  infarction in 2003 and subsequent bypass surgery in 2003.  He has good  LV function.  He says he has been doing quite well and has had no recent  chest pain, shortness of breath, or palpitations.   PAST MEDICAL HISTORY:  Significant for diabetes, hyperlipidemia, and  hypertension.  He recently was found to have prostate cancer based on  elevated PSA and a biopsy which showed cancer.  Dr. Earlene Plater did not  recommend surgery and recommended only Avodart and watchful waiting  according to [name].   CURRENT MEDICATIONS:  1. Glyburide/metformin.  2. Aspirin.  3. Lovastatin 20 mg at bedtime.  4. Omega 3.  5. Benazepril/hydrochlorothiazide 20/12.5 b.i.d.  6. Avodart.  7. Calcium.   PHYSICAL EXAMINATION:  VITAL SIGNS:  The blood pressure was 126/66 and  pulse 68 and regular.  NECK:  There was no venous distention.  The carotid pulses were full  without bruits.  CHEST:  Clear.  CARDIAC:  Rhythm was regular.  I heard no murmurs or gallops.  ABDOMEN:  Soft without organomegaly.  EXTREMITIES:  Peripheral pulses were full with no peripheral edema.   Electrocardiogram was normal.   IMPRESSION:  1. Coronary artery disease status post diaphragmatic wall infarction      and subsequent coronary artery bypass graft surgery in 2003, now      stable.  2. Good left ventricular function.  3. Hypertension with good control.  4. Hyperlipidemia.  5. Diabetes.  6. Recently diagnosed prostate cancer.   RECOMMENDATIONS:  I think [name] is doing well from a standpoint of  his heart.  We will get his last lab work from [name]' office.  We  may want to switch him from lovastatin to simvastatin.  I will plan to  see him back in followup in a year.   ADDENDUM:  [name] LDL was 60, so I plan to leave him on lovastatin.  He is still working about 5 hours a day in [employer].  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  He is originally  from [place].     Bruce Elvera Lennox Juanda Chance, MD, [facility]  Electronically Signed    BRB/MedQ  DD: 08/28/2008  DT: 08/28/2008  Job #: [id number]

## 2011-03-08 NOTE — Assessment & Plan Note (Signed)
[facility]                            CARDIOLOGY OFFICE NOTE   NAME:[name], [name]                    MRN:          [id number]  DATE:08/28/2008                            DOB:          June 02, 1921    ADDENDUM:  [name] LDL was 55, so I plan to leave him on lovastatin.  He is  still working about 5 hours a day in [employer].  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  He is originally  from [place].     Bruce Elvera Lennox Juanda Chance, MD, [facility]     BRB/MedQ  DD: 08/28/2008  DT: 08/28/2008  Job #: [id number]

## 2011-03-11 NOTE — Assessment & Plan Note (Signed)
[facility]                              CARDIOLOGY OFFICE NOTE   NAME:[name], [name]                    MRN:          [id number]  DATE:07/26/2006                            DOB:          08-04-1921    PRIMARY CARE PHYSICIAN:  Dr. Darryll Capers   CLINICAL HISTORY:  [name] is 77 years old and had a previous  diaphragmatic wall infarction followed by bypass surgery in 2003.  He has  good LV function.  He has done quite well from a cardiac standpoint and has  had no recent chest pain, shortness of breath, or palpitations.   He has had problems with his blood pressure which he takes regularly at  home.  He has been over 140 consistently on his home readings.  He has had  no symptoms related to this.   PAST MEDICAL HISTORY:  Significant for diabetes and hyperlipidemia in  addition to the hypertension.   CURRENT MEDICATIONS:  Include glyburide, aspirin, lovastatin, __________,  vitamins, and benazepril/hydrochlorothiazide 20/12.5 daily.   EXAMINATION TODAY:  VITAL SIGNS:  The blood pressure was 148/80 and the  pulse 73 and regular.  NECK:  There was no venous distention.  The carotid pulses were full without  bruits.  CHEST:  Clear.  CARDIAC:  Rhythm was regular.  I could hear no murmurs or gallops.  ABDOMEN:  Soft without organomegaly.  EXTREMITIES:  The peripheral pulses were full and there was no peripheral  edema.   An electrocardiogram was normal.   IMPRESSION:  1. Coronary artery disease status post prior documented wall infarction      and bypass surgery in 2003, now stable.  2. Good left ventricular function.  3. Hypertension, not under optimal control.  4. Hyperlipidemia.  5. Diabetes.   RECOMMENDATIONS:  I think [name] is doing well from his heart.  His blood  pressure is not optimal and I think the easiest way to adjust this would be  to double-up on his benazepril.  He is currently taking  benazepril/hydrochlorothiazide  20/12.5 daily and we can double this and we  can have him take two of these a day, which would be 40 of benazepril and 25  of hydrochlorothiazide.  Will plan to get a BMP check in a week to make sure  this is not causing problems with his renal function.  His renal function  has been normal by recently measurement.  He is to see Dr. Lovell [name] in 2 weeks and Dr. Lovell [name] can follow up and make  further adjustments as needed.  I will plan to see him back in followup in 1  year.            ______________________________  Everardo Beals. Juanda Chance, MD, [facility]     BRB/MedQ  DD:  07/26/2006  DT:  07/27/2006  Job #:  [id number]

## 2011-03-11 NOTE — Procedures (Signed)
[facility]  Patient:    [name], [name]                    MRN: [id number] Proc. Date: 08/03/00 Adm. Date:  [id number] Attending:  Estella Husk CC:         Vania Rea. Jarold Motto, M.D. [facility]  Stacie Glaze, M.D. [facility]   Procedure Report  PROCEDURE:  Esophagogastroduodenoscopy with biopsies.  INDICATION:  Evaluation of prior duodenal adenoma polypectomy site.  HISTORY:  This is a 77 year old gentleman with chronic reflux disease, who was found to have an incidental duodenal adenoma on endoscopy.  He underwent upper endoscopy with snare removal of the lesion as well as argon plasma coagulation of residual adenomatous tissue.  This was carried out April 19, 2000. Pathology revealed adenoma, though no cancer.  He is now for follow-up. He has had no interval problems.  The nature of the procedure as well as the risks, benefits, and alternatives were reviewed, and he understood and agreed to proceed.  PHYSICAL EXAMINATION:  GENERAL:  A well-appearing male in no acute distress.  He is alert and oriented.  VITAL SIGNS:  Stable.  LUNGS:  Clear.  HEART:  Regular.  ABDOMEN:  Soft.  DESCRIPTION OF PROCEDURE:  After informed consent was obtained, the patient was sedated with 50 mg of Demerol and 4 mg of Versed IV.  The Olympus side-viewing endoscope was passed blindly into the esophagus.  Stomach was not re-examined.  The duodenal bulb was normal.  The minor ampulla was normal. The major ampulla was normal.  Previous site of adenoma removal was easily identified.  Mostly scar tissue was observed.  There were some subtle mucosal changes, which may represent minute amount of residual adenomatous tissue. Biopsies were taken.  No further therapy required.  IMPRESSION:  Endoscopic evaluation of prior adenoma removal site appears quite benign with no significant residual adenomatous tissue, if any, present.  RECOMMENDATIONS:  Follow up biopsies.  If  biopsies negative for adenomatous tissue, then repeat surveillance endoscopy in 12-18 months.  If adenomatous tissue found on biopsies, then repeat endoscopy in approximately six to eight months. DD:  08/03/00 TD:  08/05/00 Job: [id number]

## 2011-03-11 NOTE — H&P (Signed)
NAME:  [name], [name]                       ACCOUNT NO.:  [id number]   MEDICAL RECORD NO.:  [id number]                   PATIENT TYPE:  INP   LOCATION:  4703                                 FACILITY:  [facility]   PHYSICIAN:  Anna Genre. Maisie Fus, M.D. [facility]           DATE OF BIRTH:  12-Sep-1921   DATE OF ADMISSION:  07/11/2004  DATE OF DISCHARGE:                                HISTORY & PHYSICAL   PRIMARY CARE PHYSICIAN:  Stacie Glaze, M.D.  Primary cardiologist, Dr.  Charlies Constable.   CHIEF COMPLAINT:  Chest pain/sternal pain.   HISTORY OF PRESENT ILLNESS:  This is an 77 year old male with a history of  coronary artery disease, status post CABG in August 2003, with six vessels,  who presents to the emergency department with chest pain that has been  occuring for the past week.  The patient has done relatively well since his  CABG.  At that time, he had elevated ejection fraction and notes that over  the past week he has had atypical chest ain which seems to be worse at rest.  It is substernal in nature, sharp and is not relieved or aggravated by  anything in particular.  Additionally, he has sternal pain at the site of  this sternotomy scar.  He currently works at [employer] and does  a lot of heavy lifting, however, he just notes today that he has had  worsening of this sort of chronic pain that he has been experiencing at his  sternotomy site.  The patient presented to the emergency department today  primarily for evaluation of his sternal pain.  His point of care enzymes in  the emergency room were borderline, so he was subsequently admitted for  further cycling of his enzymes and possible cardiac catheterization versus  noninvasive stress testing.   PAST MEDICAL HISTORY:  1.  Coronary artery disease as described previously with a normal ejection      fraction.  2.  Inferior MI in 1995, status post multiple percutaneous coronary      interventions prior to his CABG.  He  had a CABG in August 2003, with Dr.      Donata Clay x6 vessels (LIMA to mid and distal LAD, SVG to PDA and      posterolateral, SVG to OM-1 and ramus.  3.  Gastroesophageal reflux disease.  4.  Diabetes mellitus.  5.  Duodenal adenoma excised in June 2001.  6.  BPH.   MEDICATIONS:  1.  Aspirin.  2.  Antihypertensive which he is unable to report the name of.  3.  Glyburide.  4.  Actos.  5.  He is on some type of anticholesterol medication.   SOCIAL HISTORY:  He lives in [place].  He is divorced.  He has four  children who are adults and healthy.  He works at [employer] 4  days a week and has been doing so  for the past 12 years.  Denies any tobacco  or alcohol use.   FAMILY HISTORY:  He does not have a family history of CAD.  Has diabetes in  his father and several other second-degree relatives.   REVIEW OF SYSTEMS:  CARDIAC:  Positive as described above.  GASTROINTESTINAL:  Positive for gastroesophageal reflux disease.  GENITOURINARY:  He has asymptomatic prostate enlargement.  The remainder of  his review of systems are negative.   PHYSICAL EXAMINATION:  VITAL SIGNS:  Blood pressure 142/72, pulse 83,  temperature 97.1, respirations 18, saturations 98% on room air.  HEENT:  Within normal limits.  JVP normal.  LUNGS:  Clear to auscultation bilaterally.  CARDIAC:  Normal S1, S2.  Nondisplaced PMI.  He has a healed sternotomy scar  with tenderness in the mid region.  ABDOMEN:  Soft, nontender, nondistended with good bowel sounds.  EXTREMITIES:  No evidence of edema.   LABORATORY DATA AND X-RAY FINDINGS:  CBC within normal limits.  CHEM 7  normal with creatinine 1.2.  GI panel is normal.  Cardiac markers with  initial CK 354, MB 4.4 which is slightly abnormal.  His index is 1.2 which  is normal.  His troponin I is 0.05 which is slightly abnormal.  INR and PT  are normal.   EKG shows normal sinus rhythm with no ST and T wave changes.   IMPRESSION:  This is an  77 year old gentleman with history of coronary  artery disease, status post coronary artery bypass graft who presents with  chest pain atypical in nature of two varieties.  One is certainly secondary  to sternal wound and likely was aggravated by the heavy lifting does on his  job.  The second one it is unclear whether this is an anginal equivalent and  his enzymes are borderline, so we will admit him for further evaluation.   PLAN:  1.  If his enzymes continue to trend up, I will likely believe that this is      a non-STEMI and he will need to be taken to cardiac catheterization      tomorrow.  If his enzymes remain the same or trend down, we can consider      noninvasive stress testing at this point.  2.  Will give him sliding scale insulin and hold his oral agents for now.  3.  Empiric angiotensin-converting enzyme inhibitor and beta-blocker given      his coronary artery disease until we can further delineate what      medications he is on at home.  He will also be started on enoxaparin      tonight for an acute coronary syndrome.       KLT/MEDQ  D:  07/11/2004  T:  07/12/2004  Job:  [id number]

## 2011-03-11 NOTE — Discharge Summary (Signed)
NAME:  [name], [name]                       ACCOUNT NO.:  [id number]   MEDICAL RECORD NO.:  [id number]                   PATIENT TYPE:  INP   LOCATION:  2012                                 FACILITY:  [facility]   PHYSICIAN:  Gwenith Daily. Tyrone Sage, M.D.            DATE OF BIRTH:  1921/04/27   DATE OF ADMISSION:  06/04/2002  DATE OF DISCHARGE:  06/09/2002                                 DISCHARGE SUMMARY   PRIMARY CARE PHYSICIAN:  Stacie Glaze, M.D. [facility]   CARDIOLOGIST:  Everardo Beals. Juanda Chance, M.D. [facility]   DISCHARGE DIAGNOSES:  1. Progressive angina.  2. Severe three-vessel atherosclerotic cardiovascular disease with preserved     ventricular function.  3. Elevated serum blood glucose postoperatively.   SECONDARY DIAGNOSIS:  1. Type 2 diabetes mellitus.  2. Hiatal hernia/gastroesophageal reflux disease.  3. History of duodenal adenoma.  4. Remote tobacco habituation.  5. History of myocardial infarction (inferior) 1995 status post percutaneous     transluminal coronary angioplasty with subsequent stent to the proximal     right coronary artery and stent to the mid right coronary artery, mid     left anterior descending artery.  6. History of myocardial infarction, non-Q wave, January 1998 with stent     placed at the mid right coronary artery.   PROCEDURES PERFORMED:  1. June 04, 2002, left heart catheterization, left ventriculogram and     coronary angiography, Dr. Antoine Poche.  The study demonstrates that the left     anterior descending coronary had a mid 50% and an apical 99% stenosis.     The first diagonal was 100% occluded proximally.  The circumflex had a     mid AV groove 95% stenosis.  The ramus intermedius had a proximal 90%     stenosis.  The MOM had a proximal 90% stenosis.  The right coronary     artery had a midpoint, 25 to 30% stenosis that was an in-stent     restenosis.  There was a distal, long, 80 to 90% stenosis in the right     coronary artery just before the  posterior descending coronary artery.     Ejection fraction was 55%.  2. June 04, 2002, carotid duplex ultrasound which was negative for     hemodynamically significant internal carotid artery stenosis.  3. June 05, 2002, coronary artery bypass graft surgery x6 by Dr. Kathlee Nations     Trigt.  In this procedure the left internal mammary artery was connected     in sequential fashion to the mid left anterior descending and to the     distal left anterior descending, sequential reverse saphenous vein graft     was fashioned from the aorta to the posterior descending and then to the     posterolateral branch.  A sequential reverse saphenous vein graft was     fashioned from the aorta to the ramus intermedius and then to  the first     obtuse marginal.   DISCHARGE DISPOSITION:  The patient is ready for discharge on postoperative  day #4.  He was admitted with a history of progressive, accelerating angina  and found on left heart catheterization October 12 to have severe three-  vessel atherosclerotic coronary artery disease.  He was seen in consultation  by Dr. Kathlee Nations Trigt who recommended revascularization surgery as the best  treatment for his condition.  The risks and benefits were described to the  patient and he elected to undergo the procedure.  This was done on August  13.  Six bypasses were placed, as previously dictated.  The patient  tolerated the procedure well.  He was extubated on postoperative day #1.  He  maintained a sinus rhythm throughout his postoperative course.  He did not  have any respiratory compromise.  He was off all supplemental oxygen by  postoperative day #1.  He has been afebrile in the postoperative period.  His incisions are healing very nicely.  He is ambulating independently.  He  did have volume overload postoperatively.  This has been treated with steady  diuresis in the postoperative period.  He is still 6 pounds fluid positive  at the time of discharge  and he will go home with a seven-day treatment of  Lasix with supplemental potassium.  He had some nausea on postoperative day  #2 which cleared with Zofran.  His appetite has been improving.  His mental  status has remained clear in the postoperative period.  His serum blood  glucose has been consistently elevated postoperatively.  He is on three oral  hypoglycemics at home and these have been added in the postoperative period.  If it remains evident that, despite these medical interventions and despite  a ADA diet, the serum glucose is elevated a trial of subcutaneous insulin  will be inaugurated.  Currently, he goes home with the following  medications:   DISCHARGE MEDICATIONS:  1. Percocet 5/325 1-2 tablets p.o. q.4-6h. p.r.n. pain.  2. Nexium 40 mg daily.  3. Enteric-coated aspirin 325 mg daily.  4. Folic acid 400 mcg b.i.d.  5. Toprol XL 50 mg daily.  6. Lasix 40 mg daily for seven days.  7. Potassium chloride 40 mEq daily for seven days.  8. Actos 30 mg daily.  9. Altace 10 mg daily.  10.      Glucovance 5/500 b.i.d.   DISCHARGE ACTIVITIES:  The patient is to walk daily to build up his  strength.  He is asked not to lift more than 10 pounds for the next six  weeks.  He is asked not to drive until he sees Dr. Donata Clay  postoperatively.   DISCHARGE DIET:  Low-sodium, low-cholesterol, ADA diet.   WOUND CARE:  The patient may shower daily on discharge.  He is to keep his  incision clean and dry.  He will have an office visit to see Dr. Charlies Constable two weeks after surgery, and an appointment will be made for him to  see Dr. Tyrone Sage three weeks after surgery.   BRIEF HISTORY AND PHYSICAL:  The patient is a 77 year old male with known  history of atherosclerotic coronary artery disease.  He is status post prior  percutaneous interventions with multiple stentings, as dictated in his past  medical history here.  He presents with the complaint of lower sternal chest burning  associated only with exertion.  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  It is promptly relieved after resting  for 5 minutes.  He notes no associated dyspnea, diaphoresis or nausea.  He  does not have chest discomfort with any other activity including walking.  He does admit that mowing is the most arduous activity he performs.  He  has a history of reflux disease well controlled with Nexium.  He is a type 2  diabetic on oral hypoglycemic medications.  He presents August 12 for a left  heart catheterization.   HOSPITAL COURSE:  The hospital course is as described in the discharge  disposition.     Maple Mirza, PA                      Gwenith Daily. Tyrone Sage, M.D.    GM/MEDQ  D:  06/08/2002  T:  06/11/2002  Job:  [id number]   cc:   Stacie Glaze, M.D. [facility]   Bruce R. Juanda Chance, M.D. [facility]  [address]  [place]  [place] [address]

## 2011-03-11 NOTE — Procedures (Signed)
[facility]  Patient:    [name], [name]                    MRN: [id number] Proc. Date: 04/19/00 Adm. Date:  [id number] Attending:  Estella Husk CC:         Vania Rea. Jarold Motto, M.D. [facility]                           Procedure Report  PROCEDURE:  Esophagogastroduodenoscopy with snare polypectomy of duodenal adenoma and argon plasma coagulation of residual adenomatous tissue.  INDICATIONS:  Duodenal adenoma.  HISTORY:  This is a 77 year old gentleman with chronic reflux disease, who was found to have incidental duodenal adenomas on endoscopy.  These have been biopsy-proven.  He now comes back for definitive treatment.  The nature of the procedure as well as the risks, benefits, and alternatives have been reviewed. He understood and agreed to proceed.  PHYSICAL EXAMINATION:  GENERAL:  A well-appearing male in no acute distress.  He is alert and oriented, vital signs are stable.  LUNGS:  Clear.  HEART:  Regular.  ABDOMEN:  Soft.  DESCRIPTION OF PROCEDURE:  After informed consent was obtained, the patient was sedated with 50 mg of Demerol and 5 mg of Versed IV.  The patient was placed in the left lateral decubitus position.  The Olympus side-viewing endoscope was passed blindly into the esophagus.  The esophagus was not examined.  The stomach was unremarkable.  The duodenal bulb was normal.  The postbulbar duodenum revealed a normal major ampulla and a normal minor ampulla.  The most significant finding was that of a 1.5 cm sessile polyp adjacent to but separate from the major ampulla.  The lesion was removed in a piecemeal fashion with snare cautery.  The residual adenomatous-appearing tissue was treated with the argon plasma coagulator.  Hemostasis was maintained at the procedures end.  IMPRESSION:  Duodenal adenoma, status post snare polypectomy and argon plasma coagulation therapy.  RECOMMENDATIONS: 1. No aspirin or nonsteroidal  products for two weeks. 2. Prilosec 20 mg p.o. q.d. for one month. 3. Repeat endoscopy in approximately four months to assess the treated    region. DD:  04/19/00 TD:  04/20/00 Job: [id number]

## 2011-03-11 NOTE — Procedures (Signed)
[facility]  Patient:    CARLON, CHALOUX                    MRN: [id number] Proc. Date: 03/09/00 Adm. Date:  [id number] Disc. Date: [id number] Attending:  Estella Husk CC:         Vania Rea. Jarold Motto, M.D. [facility]             Stacie Glaze, M.D. [facility]                           Procedure Report  PROCEDURE PERFORMED: 1. Esophagogastroduodenoscopy with snare removal of duodenal polyp. 2. Upper endoscopic ultrasound.  INDICATIONS: 1. Known duodenal adenomas. 2. Submucosal gastric mass.  HISTORY:  The patient is a pleasant 77 year old gentleman with chronic reflux disease who recently underwent upper endoscopy with Dr. Jarold Motto.  He was found to have a large submucosal mass located in the gastric fundus.  A CT scan was performed and revealed no overwhelming abnormalities.  It was said to have filling defect along the greater curvature of the stomach along the fundus.  He is now for upper endoscopy to re-evaluate previously removed duodenal adenomas as well as assess the submucosal gastric mass.  The nature of the procedure as well as the risks, benefits and alternatives were reviewed.  He understood and agreed to proceed.  PHYSICAL EXAMINATION:  The patient is a well-appearing male in no acute distress.  He is alert and oriented.  Vital signs were stable.  Lungs were clear.  Heart was regular. Abdomen soft.  DESCRIPTION OF PROCEDURE:  After informed consent was obtained, the patient was sedated with 85 mg of Demerol and 7 mg of Versed IV.  The Olympus endoscope was passed orally under direct vision into the esophagus.  The esophagus was normal.  The stomach revealed a large submucosal mass along the greater curvature between the fundus and body.  This was pulsatile.  There was slight fullness just distal to this.  No true mass effect.  The remainder of the stomach was normal.  The duodenal bulb was normal.  The postbulbar duodenum was  remarkable for a 5 mm appearing adenoma on the lateral wall as well as a flat 1 cm in diameter adenomatous appearing polyp along the medial wall.  The smaller polyp along the lateral wall was removed with snare cautery.  The larger polyp along the medial wall was not well approached with an end viewing scope.  The removed tissue was submitted for pathologic analyksis.  Endoscopic ultrasound.  The Olympus echoendoscope was passed blindly into the esophagus.  The stomach was filled with water.  The entire stomach was carefully with a frequency of 7.5 megaherz.  The mucosa was normal thickness throughout the stomach.  There was mass effect on the stomach in the region of the greater curvature between the fundus and body.  This was extrinsic compression from the heart.  No other abnormalities noted.  IMPRESSION: 1. Duodenal adenomas.  Status post polypectomy of the smaller adenoma.  No    attempt at removal of the larger adenoma. 2. Extrinsic gastric compression secondary to a vascular structure.  RECOMMENDATIONS: 1. Follow up pathology. 2. Repeat upper endoscopy with side-viewing endoscope to remove the larger    polyp.  He may require argon plasma coagulation therapy of the lesion    concurrently.  Discussed this with Dr. Jarold Motto who requests that I  proceed accordingly. DD:  03/09/00 TD:  03/14/00 Job: [id number]

## 2011-03-11 NOTE — Cardiovascular Report (Signed)
   NAME:  [name], [name]                       ACCOUNT NO.:  [id number]   MEDICAL RECORD NO.:  [id number]                   PATIENT TYPE:  OIB   LOCATION:  2899                                 FACILITY:  [facility]   PHYSICIAN:  Rollene Rotunda, M.D. [facility]            DATE OF BIRTH:  1921/02/15   DATE OF PROCEDURE:  06/04/2002  DATE OF DISCHARGE:                              CARDIAC CATHETERIZATION   DATE OF BIRTH:  08-19-1921   PRIMARY CARE PHYSICIAN:  Stacie Glaze, M.D.   PRIMARY CARDIOLOGIST:  Everardo Beals. Juanda Chance, M.D.   PROCEDURE:  Left heart catheterization/coronary arteriography.   INDICATIONS:  Evaluate patient with exertional chest pain and a Cardiolite  suggesting inferior and inferoapical as well as apical ischemia.   DESCRIPTION OF PROCEDURE:  Left heart catheterization was performed via the  left femoral artery. The artery was cannulated using the anterior wall  puncture. A #6 French arterial sheath was inserted via the modified  Seldinger technique. Preformed Judkins and a pigtail catheter were utilized.  The patient tolerated the procedure well and left the lab in stable  condition.   HEMODYNAMICS:  LV 160/14, AO 160/72.   CORONARY ARTERIES:  The left main had luminal irregularities.   The LAD had diffuse luminal irregularities. There was mid 50% stenosis.  There was an apical 99% stenosis with a large apical segment distal to this.  The first diagonal appeared to be a small vessel and was occluded  proximally.   The circumflex was a large vessel. In the mid AV groove between the ramus  intermediate and the mid obtuse marginal, but it was 95% stenosed. The ramus  intermediate was moderate sized with proximal 90% stenosis. The mid obtuse  marginal was large and branching with ostial 90% stenosis.   The right coronary artery was a dominant vessel. There were proximal mid  stents. These had long mid 25-30% in-stent re-stenosis. There was a distal  long 80-90%  stenosis before the PDA.   LEFT VENTRICULOGRAM: The left ventriculogram was obtained in the RAO  projection. The EF was 55%.    CONCLUSION:  .  Three-vessel coronary artery disease with well preserved ejection fraction.   PLAN:  The patient will be referred for CABG.                                                  Rollene Rotunda, M.D. [facility]    JH/MEDQ  D:  06/04/2002  T:  06/08/2002  Job:  [id number]

## 2011-03-11 NOTE — Discharge Summary (Signed)
   NAME:  [name], [name]                       ACCOUNT NO.:  [id number]   MEDICAL RECORD NO.:  [id number]                   PATIENT TYPE:  INP   LOCATION:  2012                                 FACILITY:  [facility]   PHYSICIAN:  Maple Mirza, PA                DATE OF BIRTH:  August 15, 1921   DATE OF ADMISSION:  06/04/2002  DATE OF DISCHARGE:                                 DISCHARGE SUMMARY   REASON FOR EXTENSION OF DISCHARGE:  The patient had nausea, constipation and  was generally feeling fatigued with ambulation on Saturday, June 08, 2002.  His discharge which was originally planned for June 09, 2002,  postoperative day #4 was held for another day while bowel management was  effected.  In addition, serum blood glucose was obtained a.c. and h.s.  By  postoperative day #4 all of his oral hypoglycemic medications had been added  on.  He was on Actos 30 mg q.d. and Glucovance 5/500 twice daily.  In  addition he was eating, but sparingly, from a low sodium, low cholesterol  ADA diet. [name] was covered with each meal on postoperative day #4,  June 08, 2002 with sliding scale Humalog coverage but still, his serum  blood glucose on the morning of June 09, 2002 was 153 mg/dl and required  coverage.  By noon on June 09, 2002, his blood sugar was 211 mg/dl,  requiring 8 units of Humalog coverage.  It is clear that when [name]  begins to pick up speed with his diet at home, even on his current oral  hypoglycemics and even with a moderate increase in his exercise, he will  still need some exogenous insulin.  It is recommended that he followup with  his primary care giver, Dr. Darryll [name] to address this.  By the time of  his discharge, [name] was feeling much better.  He had adequate bowel  movements.  He was eating fairly well and his exercise tolerance had  improved.  He was ready for discharge on June 10, 2002, postoperative day  #5 after undergoing coronary artery bypass  graft surgery x 6.                                               Maple Mirza, PA    GM/MEDQ  D:  06/09/2002  T:  06/12/2002  Job:  [id number]   cc:   Gwenith Daily. Tyrone Sage, M.D.   Lynford Humphrey, M.D.   Bruce Elvera Lennox Juanda Chance, M.D. [facility]  [address]  [place]  [place] [address]

## 2011-03-11 NOTE — Op Note (Signed)
NAME:  [name], [name]                       ACCOUNT NO.:  [id number]   MEDICAL RECORD NO.:  [id number]                   PATIENT TYPE:  INP   LOCATION:  2012                                 FACILITY:  [facility]   PHYSICIAN:  Gwenith Daily. Tyrone Sage, M.D.            DATE OF BIRTH:  02/07/1921   DATE OF PROCEDURE:  06/05/2002  DATE OF DISCHARGE:                                 OPERATIVE REPORT   PREOPERATIVE DIAGNOSIS:  Coronary occlusive disease.   POSTOPERATIVE DIAGNOSIS:  Coronary occlusive disease.   PROCEDURE:  Coronary artery bypass grafting x6 with the left internal  mammary artery sequentially to the mid- and distal left anterior descending  coronary artery, sequential reversed saphenous vein graft to the  intermediate coronary artery and obtuse marginal, sequential reversed  saphenous vein graft to the posterior descending and posterolateral  branches, with endovein.   SURGEON:  Gwenith Daily. Tyrone Sage, M.D.   ASSISTANT:  Jody P. Diamond Nickel.   HISTORY:  The patient is a 77 year old male who presented several years ago  with inferior myocardial infarction and underwent angioplasty and stenting  of the right coronary artery.  He re-presents now with recurrent chest pain  and is stabilized medically.  He had trace elevation of his troponins.  Cardiac catheterization was performed, which demonstrated significant three-  vessel coronary artery disease with mid- and distal LAD disease, greater  than 80% stenosis of the ramus and circumflex system, sequential 80%  stenoses of the right coronary artery, and 80% stenosis of the  posterolateral coronary artery.  Because of the patient's diffuse three-  vessel disease, coronary artery bypass grafting was recommended.  The  patient agreed and signed informed consent.   DESCRIPTION OF PROCEDURE:  With Swan-Ganz and arterial line monitors in  place, the patient underwent general endotracheal anesthesia without  incident.  The chest and legs  were prepped with Betadine and draped in the  usual sterile manner.  Using a Guidant endovein harvesting system, vein was  harvested from the right thigh.  Additional vein was then harvested also  from the right lower extremity as the portion of this vein in the vicinity  of the knee was not usable.  Two adequate sections of vein were obtained.  Median sternotomy was performed.  The left internal mammary artery was  dissected down as a pedicle graft, and the distal artery was divided and had  good, free flow.  The pericardium was opened.  Overall ventricular function  appeared preserved.  The patient was systemically heparinized.  The  ascending aorta and the right atrium were cannulated.  An aortic root vent  cardioplegia needle was introduced into the ascending aorta.  The patient  was placed on cardiopulmonary bypass, 2.4 L/min. per sq. m.  Sites of  anastomosis were selected and dissected out of the epicardium.  The  patient's body temperature was cooled to 30 degrees, the aortic crossclamp  was applied, and 500  cc of cold blood potassium cardioplegia was  administered with rapid diastolic arrest of the heart.  The myocardial  septal temperature was monitored throughout the crossclamp period.  Attention was turned first to the ramus intermedius branch, which was  opened, and using a diamond-type side-to-side anastomosis was carried out.  The distal extent of this same vein was then carried a distance to the  larger obtuse marginal coronary artery, which was opened and a distal  anastomosis was performed with a running 7-0 Prolene.  Attention was then  turned to the posterior descending, which was opened and in the AV groove,  the proximal posterolateral branch was dissected out.  The vein was then in  a sequential manner anastomosed first to the posterior descending and then  to the posterolateral branch.  Additional cold blood cardioplegia was  administered down the vein grafts.   Attention was then turned to the left  anterior descending coronary artery.  In the midportion of the LAD, the  vessel was opened.  There was both disease proximally and distally.  From  the cineangiogram it was known that the patient's LAD wrapped around the  apex of the heart but had distal disease almost at the apex.  The vessel was  still of sufficient size, and it was decided to sequentially bypass the LAD  both in the midportion and the distal portion with the internal mammary.  Using a side-to-side longitudinal anastomosis through the midportion of the  LAD, the left internal mammary artery was anastomosed with a running 8-0  Prolene.  The distal extent of the mammary artery was then carried to the  more distal, almost at the apex, LAD, which was opened and admitted a 1 mm  probe.  Using a running 8-0 Prolene, the distal anastomosis was performed.  Aortic crossclamp was removed, total crossclamp time of 87 minutes.  The  patient required electrical defibrillation and returned to a sinus rhythm.  The partial occlusion clamp was placed on the ascending aorta.  Two punch  aortotomies were performed and each of the two vein grafts anastomosed to  the ascending aorta.  Air was evacuated from the grafts, and the partial  occlusion clamp was removed.  Sites of anastomosis were inspected for any  bleeding.  The patient was then ventilated and weaned from cardiopulmonary  bypass without difficulty.  He remained hemodynamically stable and was  decannulated in the usual fashion.  Protamine sulfate was administered with  the operative field hemostatic.  Two atrial and two ventricular pacing wires  were applied.  The pericardium was loosely reapproximated, the sternum  closed with interrupted #6 stainless steel wire, the fascia closed with  interrupted 3-0 Vicryl, running 3-0 Vicryl for the subcutaneous tissue, and a 4-0 subcuticular stitch in the skin edges.  Dry dressings were applied.  Sponge  and needle counts was reported as correct at the completion of the  procedure.  The patient tolerated the procedure without obvious  complications and was transferred to the surgical intensive care unit for  further postoperative care.                                               Gwenith Daily Tyrone Sage, M.D.    EBG/MEDQ  D:  06/10/2002  T:  06/10/2002  Job:  [id number]   cc:   Everardo Beals. Juanda Chance, M.D. [facility]  [address]  [place]  [place] [address]

## 2011-05-16 ENCOUNTER — Encounter: Payer: Self-pay | Admitting: Internal Medicine

## 2011-05-16 ENCOUNTER — Ambulatory Visit (INDEPENDENT_AMBULATORY_CARE_PROVIDER_SITE_OTHER): Payer: Medicare HMO | Admitting: Internal Medicine

## 2011-05-16 VITALS — BP 140/82 | HR 76 | Temp 98.2°F | Resp 16 | Ht 70.0 in | Wt 206.0 lb

## 2011-05-16 DIAGNOSIS — N138 Other obstructive and reflux uropathy: Secondary | ICD-10-CM

## 2011-05-16 DIAGNOSIS — IMO0001 Reserved for inherently not codable concepts without codable children: Secondary | ICD-10-CM

## 2011-05-16 DIAGNOSIS — N401 Enlarged prostate with lower urinary tract symptoms: Secondary | ICD-10-CM

## 2011-05-16 DIAGNOSIS — E538 Deficiency of other specified B group vitamins: Secondary | ICD-10-CM

## 2011-05-16 DIAGNOSIS — I10 Essential (primary) hypertension: Secondary | ICD-10-CM

## 2011-05-16 LAB — BASIC METABOLIC PANEL
BUN: 33 mg/dL — ABNORMAL HIGH (ref 6–23)
CO2: 25 mEq/L (ref 19–32)
Calcium: 9.2 mg/dL (ref 8.4–10.5)
Chloride: 106 mEq/L (ref 96–112)
Creatinine, Ser: 1.2 mg/dL (ref 0.4–1.5)
GFR: 75.57 mL/min (ref >60.00)
Glucose, Bld: 86 mg/dL (ref 70–99)
Potassium: 4.5 mEq/L (ref 3.5–5.1)
Sodium: 138 mEq/L (ref 135–145)

## 2011-05-16 LAB — HEMOGLOBIN A1C: Hgb A1c MFr Bld: 8.1 % — ABNORMAL HIGH (ref 4.6–6.5)

## 2011-05-16 LAB — VITAMIN B12: Vitamin B-12: 620 pg/mL (ref 211–911)

## 2011-05-16 MED ORDER — FINASTERIDE 5 MG PO TABS: 5.0000 mg | ORAL_TABLET | Freq: Every day | ORAL | Status: DC

## 2011-05-16 NOTE — Progress Notes (Signed)
  Subjective:    Patient ID: [name], male    DOB: September 22, 1921, 77 y.o.   MRN: [id number]  HPI patient is an 77 year old African American male who is doing exceptionally well controlling his diabetes hypertension and hyperlipidemia he states that his fasting blood glucose this morning was 107 he denies any hyper-glycemia events and he states that he has been monitoring his glucose at least every other day.  His blood pressure is well controlled on his current medication he is compliant with his medication he is also compliant with his anti-hyperlipidemia medication.  He exercises regularly and is exercising at the gym 30 minutes 4 times a week on a treadmill    Review of Systems  Constitutional: Negative for fever and fatigue.  HENT: Negative for hearing loss, congestion, neck pain and postnasal drip.   Eyes: Negative for discharge, redness and visual disturbance.  Respiratory: Negative for cough, shortness of breath and wheezing.   Cardiovascular: Negative for leg swelling.  Gastrointestinal: Negative for abdominal pain, constipation and abdominal distention.  Genitourinary: Negative for urgency and frequency.  Musculoskeletal: Negative for joint swelling and arthralgias.  Skin: Negative for color change and rash.  Neurological: Negative for weakness and light-headedness.  Hematological: Negative for adenopathy.  Psychiatric/Behavioral: Negative for behavioral problems.       Past Medical History  Diagnosis Date  . Hyperlipidemia   . Hypertension   . Elevated PSA   . CAD (coronary artery disease)   . Diabetes mellitus   . GERD (gastroesophageal reflux disease)   . Hemorrhoids   . Hx of adenomatous colonic polyps   . Arthritis   . CAD (coronary artery disease)     post diaphragjatic wall infarction and subsequent coronary artery bypass graft surgery in 2003, now stable  . Prostate cancer    Past Surgical History  Procedure Date  . Coronary artery bypass graft     reports that he quit smoking about 62 years ago. He does not have any smokeless tobacco history on file. He reports that he does not drink alcohol or use illicit drugs. family history includes Diabetes in his mother and Heart disease in his mother. No Known Allergies  Objective:   Physical Exam  Constitutional: He appears well-developed and well-nourished.  HENT:  Head: Normocephalic and atraumatic.  Eyes: Conjunctivae are normal. Pupils are equal, round, and reactive to light.  Neck: Normal range of motion. Neck supple.  Cardiovascular: Normal rate and regular rhythm.   Pulmonary/Chest: Effort normal and breath sounds normal.  Abdominal: Soft. Bowel sounds are normal.  Musculoskeletal:       Mild degenerative joint disease of the knees bilaterally          Assessment & Plan:  The patient's diabetes appears to be in good control hemoglobin A1c will be drawn today to confirm monitoring continued exercise diet and medications are encouraged.  His cholesterol has been at goal in the past and a lipid drawn today his blood pressure has been stable and a basic metabolic panel will be drawn today and monitor potassium and creatinine function he'll followup in 4 months a new glucose meter was given and he was taught to use this meter

## 2011-05-25 ENCOUNTER — Telehealth: Payer: Self-pay | Admitting: *Deleted

## 2011-05-25 MED ORDER — GLUCOSE BLOOD VI STRP: ORAL_STRIP | Status: DC

## 2011-05-25 NOTE — Telephone Encounter (Signed)
Sent in

## 2011-06-06 NOTE — Telephone Encounter (Signed)
Opened in error

## 2011-06-22 ENCOUNTER — Encounter: Payer: Self-pay | Admitting: Internal Medicine

## 2011-06-22 ENCOUNTER — Ambulatory Visit (INDEPENDENT_AMBULATORY_CARE_PROVIDER_SITE_OTHER): Payer: Medicare HMO | Admitting: Internal Medicine

## 2011-06-22 VITALS — BP 132/80 | HR 62 | Ht 70.0 in | Wt 203.0 lb

## 2011-06-22 DIAGNOSIS — E119 Type 2 diabetes mellitus without complications: Secondary | ICD-10-CM

## 2011-06-22 DIAGNOSIS — K5900 Constipation, unspecified: Secondary | ICD-10-CM

## 2011-06-22 DIAGNOSIS — R198 Other specified symptoms and signs involving the digestive system and abdomen: Secondary | ICD-10-CM

## 2011-06-22 DIAGNOSIS — Z86010 Personal history of colonic polyps: Secondary | ICD-10-CM

## 2011-06-22 MED ORDER — PEG-KCL-NACL-NASULF-NA ASC-C 100 G PO SOLR: 1.0000 | Freq: Once | ORAL | Status: DC

## 2011-06-22 NOTE — Patient Instructions (Addendum)
Colon [facility] 07/15/11 at 10:30 am arrive at 9:30 am Moviprep sent to your pharmacy Colon Brochure given for you to read. Hold diabetic medication day of as directed on your instructions for your Colonoscopy.

## 2011-06-22 NOTE — Progress Notes (Signed)
HISTORY OF PRESENT ILLNESS:  [name] is a 77 y.o. male with a history of hypertension, hyperlipidemia, coronary artery disease, and osteoarthritis. His GI history is remarkable for previously resected duodenal adenoma, colonic adenomas, and AVMs. He presents today with chief complaint of new-onset constipation and a desire to have surveillance colonoscopy. His last colonoscopy was performed in October 2003. Was found to have 2 diminutive polyps which were removed and found to be adenomatous. Due to his age, when necessary followup recommended. Despite his age, he is currently in exceptional health. For his constipation he has been using magnesium citrate intermittently. Some abdominal cramping, though mild. No bleeding or weight loss. Recent labs reveal a mild anemia with hemoglobin of 12.3.   REVIEW OF SYSTEMS:  All non-GI ROS negative except for joint pain  Past Medical History  Diagnosis Date  . Hyperlipidemia   . Hypertension   . Elevated PSA   . CAD (coronary artery disease)   . Diabetes mellitus   . GERD (gastroesophageal reflux disease)   . Hemorrhoids   . Hx of adenomatous colonic polyps   . Arthritis   . CAD (coronary artery disease)     post diaphragjatic wall infarction and subsequent coronary artery bypass graft surgery in 2003, now stable  . Prostate cancer     Past Surgical History  Procedure Date  . Coronary artery bypass graft     Social History [name]  reports that he quit smoking about 62 years ago. He has never used smokeless tobacco. He reports that he does not drink alcohol or use illicit drugs.  family history includes Diabetes in his mother and Heart disease in his mother.  No Known Allergies     PHYSICAL EXAMINATION: Vital signs: BP 132/80  Pulse 62  Ht 5\' 10"  (1.778 m)  Wt 203 lb (92.08 kg)  BMI 29.13 kg/m2  Constitutional: generally well-appearing, no acute distress Psychiatric: alert and oriented x3, cooperative Eyes:  extraocular movements intact, anicteric, conjunctiva pink Mouth: oral pharynx moist, no lesions Neck: supple no lymphadenopathy Cardiovascular: heart regular rate and rhythm, no murmur Lungs: clear to auscultation bilaterally Abdomen: soft, nontender, nondistended, no obvious ascites, no peritoneal signs, normal bowel sounds, no organomegaly Rectal: Deferred to colonoscopy Extremities: no lower extremity edema bilaterally Skin: no lesions on visible extremities Neuro: No focal deficits.   ASSESSMENT:  #1. Change in bowel habits with Constipation. Likely functional #2. History of colonic adenomas. Last exam 2003 #3. History of duodenal adenoma status post resection #4. Diabetes mellitus   PLAN:  #1. GlycoLax 17 g in 8 ounces of water for constipation. Titrate to need #2. Colonoscopy to evaluate change in bowel habits and provide surveillance in a patient with colonic adenomas. We discussed the pros and cons of colonoscopy at age 59. He was very interested in proceeding. I think he is an appropriate candidate without contraindication.The nature of the procedure, as well as the risks, benefits, and alternatives were carefully and thoroughly reviewed with the patient. Ample time for discussion and questions allowed. The patient understood, was satisfied, and agreed to proceed. Movi prep prescribed. The patient instructed on issues #3. Hold diabetic medications today and the exam

## 2011-07-15 ENCOUNTER — Encounter: Payer: Self-pay | Admitting: Internal Medicine

## 2011-07-15 ENCOUNTER — Ambulatory Visit (AMBULATORY_SURGERY_CENTER): Payer: Medicare HMO | Admitting: Internal Medicine

## 2011-07-15 VITALS — BP 137/74 | HR 74 | Temp 96.8°F | Resp 17 | Ht 70.0 in | Wt 203.0 lb

## 2011-07-15 DIAGNOSIS — R198 Other specified symptoms and signs involving the digestive system and abdomen: Secondary | ICD-10-CM

## 2011-07-15 DIAGNOSIS — D126 Benign neoplasm of colon, unspecified: Secondary | ICD-10-CM

## 2011-07-15 DIAGNOSIS — Z1211 Encounter for screening for malignant neoplasm of colon: Secondary | ICD-10-CM

## 2011-07-15 DIAGNOSIS — Z86010 Personal history of colonic polyps: Secondary | ICD-10-CM

## 2011-07-15 LAB — GLUCOSE, CAPILLARY: Glucose-Capillary: 144 mg/dL — ABNORMAL HIGH (ref 70–99)

## 2011-07-15 MED ORDER — SODIUM CHLORIDE 0.9 % IV SOLN: 500.0000 mL | INTRAVENOUS | Status: DC

## 2011-07-15 NOTE — Patient Instructions (Signed)
Please read the handouts given to you by your recovery room nurse.    Your polyp results will be mailed to you within 2 weeks.   Try to increase the fiber in your diet.   Resume your routine medications today.   If you have any questions, please call us at [phone].  Thank-you for choosing [place] for your healthcare needs today.

## 2011-07-18 ENCOUNTER — Telehealth: Payer: Self-pay

## 2011-07-18 NOTE — Telephone Encounter (Signed)
Follow up Call- Patient questions:  Do you have a fever, pain , or abdominal swelling? no Pain Score  0 *  Have you tolerated food without any problems? yes  Have you been able to return to your normal activities? yes  Do you have any questions about your discharge instructions: Diet   no Medications  no Follow up visit  no  Do you have questions or concerns about your Care? no  Actions: * If pain score is 4 or above: No action needed, pain <4. Per the pt he said he has not had a BM yet.  I explained it may take a few days for his GI tract to get back to normal.  No pain or any other sx. Noted.  I advised him to call if he has not had a BM in couple of days or if any SX change. MAW

## 2011-07-21 ENCOUNTER — Telehealth: Payer: Self-pay | Admitting: Internal Medicine

## 2011-07-21 NOTE — Telephone Encounter (Signed)
Pt calling wanting the know the results of his pathology report. Reviewed results with pt and let him know he should be receiving a letter in the mail soon. Pt verbalized understanding.

## 2011-09-16 ENCOUNTER — Ambulatory Visit: Payer: Medicare HMO | Admitting: Internal Medicine

## 2011-09-19 ENCOUNTER — Encounter: Payer: Self-pay | Admitting: Internal Medicine

## 2011-09-19 ENCOUNTER — Ambulatory Visit (INDEPENDENT_AMBULATORY_CARE_PROVIDER_SITE_OTHER): Payer: Medicare HMO | Admitting: Internal Medicine

## 2011-09-19 DIAGNOSIS — E119 Type 2 diabetes mellitus without complications: Secondary | ICD-10-CM

## 2011-09-19 DIAGNOSIS — I10 Essential (primary) hypertension: Secondary | ICD-10-CM

## 2011-09-19 DIAGNOSIS — I2510 Atherosclerotic heart disease of native coronary artery without angina pectoris: Secondary | ICD-10-CM

## 2011-09-19 MED ORDER — SITAGLIPTIN PHOSPHATE 100 MG PO TABS: 100.0000 mg | ORAL_TABLET | Freq: Every day | ORAL | Status: DC

## 2011-09-19 NOTE — Progress Notes (Signed)
Subjective:    Patient ID: [name], male    DOB: [birth year], 77 y.o.   MRN: [id number]  HPI Patient is an 77 year old male who presents for followup of hypertension diabetes and hyperlipidemia with a known history of coronary disease.  He is followed by cardiology and gastroenterology. He has mild to moderate anemia His diabetes has been in moderate control but the last A1c's have been in the 8 range He has noticed increased CBGs over the past few weeks   Review of Systems  Constitutional: Negative for fever and fatigue.  HENT: Negative for hearing loss, congestion, neck pain and postnasal drip.   Eyes: Negative for discharge, redness and visual disturbance.  Respiratory: Negative for cough, shortness of breath and wheezing.   Cardiovascular: Negative for leg swelling.  Gastrointestinal: Negative for abdominal pain, constipation and abdominal distention.  Genitourinary: Negative for urgency and frequency.  Musculoskeletal: Negative for joint swelling and arthralgias.  Skin: Negative for color change and rash.  Neurological: Negative for weakness and light-headedness.  Hematological: Negative for adenopathy.  Psychiatric/Behavioral: Negative for behavioral problems.       Past Medical History  Diagnosis Date  . Hyperlipidemia   . Hypertension   . Elevated PSA   . CAD (coronary artery disease)   . Diabetes mellitus   . GERD (gastroesophageal reflux disease)   . Hemorrhoids   . Hx of adenomatous colonic polyps   . Arthritis   . CAD (coronary artery disease)     post diaphragjatic wall infarction and subsequent coronary artery bypass graft surgery in 2003, now stable  . Prostate cancer     History   Social History  . Marital Status: Divorced    Spouse Name: N/A    Number of Children: N/A  . Years of Education: N/A   Occupational History  . Retired [employer]   Social History Main Topics  . Smoking status: Former Smoker    Quit date:  10/25/1948  . Smokeless tobacco: Never Used  . Alcohol Use: No  . Drug Use: No  . Sexually Active: Not Currently   Other Topics Concern  . Not on file   Social History Narrative   4 children    Past Surgical History  Procedure Date  . Coronary artery bypass graft     Family History  Problem Relation Age of Onset  . Heart disease Mother   . Diabetes Mother     No Known Allergies  Current Outpatient Prescriptions on File Prior to Visit  Medication Sig Dispense Refill  . aspirin (ECOTRIN) 325 MG EC tablet Take 325 mg by mouth daily.        . benazepril-hydrochlorthiazide (LOTENSIN HCT) 20-12.5 MG per tablet Take 1 tablet by mouth 2 (two) times daily.        . finasteride (PROSCAR) 5 MG tablet Take 1 tablet (5 mg total) by mouth daily.  30 tablet  2  . glucose blood test strip Use as instructed  100 each  12  . glyBURIDE-metformin (GLUCOVANCE) 5-500 MG per tablet Take 1 tablet by mouth 2 (two) times daily with a meal.        . lovastatin (MEVACOR) 20 MG tablet Take 20 mg by mouth at bedtime.        . vitamin B-12 (CYANOCOBALAMIN) 100 MCG tablet Take 50 mcg by mouth daily.        . vitamin C (ASCORBIC ACID) 500 MG tablet Take 500 mg by mouth daily.        Marland Kitchen  vitamin E 1000 UNIT capsule Take 1,000 Units by mouth daily.          BP 140/80  Pulse 76  Temp 98.2 F (36.8 C)  Resp 16  Ht 5\' 10"  (1.778 m)  Wt 206 lb (93.441 kg)  BMI 29.56 kg/m2    Objective:   Physical Exam  Nursing note and vitals reviewed. Constitutional: He appears well-developed and well-nourished.  HENT:  Head: Normocephalic and atraumatic.  Eyes: Conjunctivae are normal. Pupils are equal, round, and reactive to light.  Neck: Normal range of motion. Neck supple.  Cardiovascular: Normal rate and regular rhythm.   Pulmonary/Chest: Effort normal and breath sounds normal.  Abdominal: Soft. Bowel sounds are normal.          Assessment & Plan:  Poorly controlled adult-onset diabetes with  hemoglobin A1c is in the 8 range and increasing CBGs at home currently he is on metformin and glyburide twice daily we're going to add an oral agent to see if we can control his blood glucose without adding a basilar insulin Blood pressure is stable no chest pain cholesterol stable on the Mevacor  Will add januvia Discussed effects and side effects

## 2011-09-19 NOTE — Patient Instructions (Addendum)
The patient is instructed to continue all medications as prescribed. Schedule followup with check out clerk upon leaving the clinic Continue all of your medications that you were on prior to this office visit and start taking Januvia 1 tablet in the morning

## 2011-09-22 ENCOUNTER — Telehealth: Payer: Self-pay | Admitting: Internal Medicine

## 2011-09-22 NOTE — Telephone Encounter (Signed)
Refill Benzazepril , Lovastatin, glyburide to [facility] mail order. Thanks.

## 2011-09-23 ENCOUNTER — Other Ambulatory Visit: Payer: Self-pay | Admitting: *Deleted

## 2011-09-23 MED ORDER — GLYBURIDE-METFORMIN 5-500 MG PO TABS: 1.0000 | ORAL_TABLET | Freq: Two times a day (BID) | ORAL | Status: DC

## 2011-09-23 MED ORDER — LOVASTATIN 20 MG PO TABS: 20.0000 mg | ORAL_TABLET | Freq: Every day | ORAL | Status: DC

## 2011-09-23 MED ORDER — BENAZEPRIL-HYDROCHLOROTHIAZIDE 20-12.5 MG PO TABS: 1.0000 | ORAL_TABLET | Freq: Two times a day (BID) | ORAL | Status: DC

## 2011-09-23 NOTE — Telephone Encounter (Signed)
done

## 2011-10-27 ENCOUNTER — Telehealth: Payer: Self-pay | Admitting: Internal Medicine

## 2011-10-27 ENCOUNTER — Other Ambulatory Visit: Payer: Self-pay | Admitting: *Deleted

## 2011-10-27 DIAGNOSIS — E119 Type 2 diabetes mellitus without complications: Secondary | ICD-10-CM

## 2011-10-27 MED ORDER — BENAZEPRIL-HYDROCHLOROTHIAZIDE 20-12.5 MG PO TABS: 1.0000 | ORAL_TABLET | Freq: Two times a day (BID) | ORAL | Status: DC

## 2011-10-27 MED ORDER — GLUCOSE BLOOD VI STRP: ORAL_STRIP | Status: DC

## 2011-10-27 NOTE — Telephone Encounter (Signed)
Pt would like to start ordering his test strips through [facility]. Please fax rx to [phone]. He tests bid. Thanks.

## 2011-10-31 ENCOUNTER — Telehealth: Payer: Self-pay | Admitting: Internal Medicine

## 2011-10-31 ENCOUNTER — Telehealth: Payer: Self-pay | Admitting: *Deleted

## 2011-10-31 DIAGNOSIS — E119 Type 2 diabetes mellitus without complications: Secondary | ICD-10-CM

## 2011-10-31 NOTE — Telephone Encounter (Signed)
Done

## 2011-10-31 NOTE — Telephone Encounter (Signed)
Walk in----pt would like a rx for a meter faxed to [facility] at [phone]. Thanks.

## 2011-10-31 NOTE — Telephone Encounter (Signed)
Written script for glucometer faxed to [facility]

## 2011-11-15 ENCOUNTER — Telehealth: Payer: Self-pay | Admitting: Family Medicine

## 2011-11-15 NOTE — Telephone Encounter (Signed)
Patient called today. Is having a very difficult time urinating, combined with penile pain. He is concerned about a UTI. I tried to get him to see someone else this week, as [name] does not have any openings. He was not comfortable with that at all and did not wish to see someone else. Can you please call him and either work him in with Dr. Shela Commons or see if you can get him to come in to see someone else? Thanks!!!

## 2011-11-15 NOTE — Telephone Encounter (Signed)
Appointment given with dr Kirtland Bouchard for early am

## 2011-11-16 ENCOUNTER — Encounter: Payer: Self-pay | Admitting: Internal Medicine

## 2011-11-16 ENCOUNTER — Ambulatory Visit (INDEPENDENT_AMBULATORY_CARE_PROVIDER_SITE_OTHER): Payer: Medicare HMO | Admitting: Internal Medicine

## 2011-11-16 VITALS — BP 126/70 | Temp 97.9°F | Wt 209.0 lb

## 2011-11-16 DIAGNOSIS — I10 Essential (primary) hypertension: Secondary | ICD-10-CM

## 2011-11-16 DIAGNOSIS — R350 Frequency of micturition: Secondary | ICD-10-CM

## 2011-11-16 DIAGNOSIS — C61 Malignant neoplasm of prostate: Secondary | ICD-10-CM

## 2011-11-16 LAB — POCT URINALYSIS DIPSTICK
Bilirubin, UA: NEGATIVE
Blood, UA: NEGATIVE
Glucose, UA: NEGATIVE
Ketones, UA: NEGATIVE
Leukocytes, UA: NEGATIVE
Nitrite, UA: NEGATIVE
Protein, UA: NEGATIVE
Spec Grav, UA: 1.025
Urobilinogen, UA: 0.2
pH, UA: 5

## 2011-11-16 MED ORDER — TAMSULOSIN HCL 0.4 MG PO CAPS: 0.4000 mg | ORAL_CAPSULE | Freq: Every day | ORAL | Status: DC

## 2011-11-16 NOTE — Patient Instructions (Signed)
Flomax 0.4 mg take twice daily for 7 days and if urinary symptoms have improved decrease to once daily Continue Proscar once daily  If symptoms are unimproved please call the office for referral to urology

## 2011-11-16 NOTE — Progress Notes (Signed)
  Subjective:    Patient ID: [name], male    DOB: [birth year], 78 y.o.   MRN: [id number]  HPI  78 year old patient who has a history of BPH and prostate cancer. He also has a history of treated hypertension. He has type 2 diabetes For the past 2 weeks he has had urinary frequency and urgency and decreased urinary stream. He also describes some achiness in the groin area. Denies any fever or dysuria.    Review of Systems  Constitutional: Negative for fever, chills, appetite change and fatigue.  HENT: Negative for hearing loss, ear pain, congestion, sore throat, trouble swallowing, neck stiffness, dental problem, voice change and tinnitus.   Eyes: Negative for pain, discharge and visual disturbance.  Respiratory: Negative for cough, chest tightness, wheezing and stridor.   Cardiovascular: Negative for chest pain, palpitations and leg swelling.  Gastrointestinal: Negative for nausea, vomiting, abdominal pain, diarrhea, constipation, blood in stool and abdominal distention.  Genitourinary: Positive for urgency, frequency, decreased urine volume, difficulty urinating and penile pain. Negative for hematuria, flank pain, discharge and genital sores.  Musculoskeletal: Negative for myalgias, back pain, joint swelling, arthralgias and gait problem.  Skin: Negative for rash.  Neurological: Negative for dizziness, syncope, speech difficulty, weakness, numbness and headaches.  Hematological: Negative for adenopathy. Does not bruise/bleed easily.  Psychiatric/Behavioral: Negative for behavioral problems and dysphoric mood. The patient is not nervous/anxious.        Objective:   Physical Exam  Constitutional: He appears well-developed and well-nourished. No distress.       Patient was able to give a urine specimen that was examined and was normal. Blood pressure 126/70           Assessment & Plan:   BPH with prostate cancer. Now with significant obstructive symptoms. We'll continue  Proscar 5 mg daily Will place on Flomax 0.4 mg twice a day for 7 days and then daily if improved. If no satisfactory improvement we'll refer back to urology Hypertension stable

## 2011-12-19 ENCOUNTER — Ambulatory Visit (INDEPENDENT_AMBULATORY_CARE_PROVIDER_SITE_OTHER): Payer: Medicare HMO | Admitting: Internal Medicine

## 2011-12-19 ENCOUNTER — Encounter: Payer: Self-pay | Admitting: Internal Medicine

## 2011-12-19 VITALS — BP 132/80 | HR 68 | Temp 98.3°F | Resp 16 | Ht 70.0 in | Wt 212.0 lb

## 2011-12-19 DIAGNOSIS — T887XXA Unspecified adverse effect of drug or medicament, initial encounter: Secondary | ICD-10-CM

## 2011-12-19 DIAGNOSIS — E785 Hyperlipidemia, unspecified: Secondary | ICD-10-CM

## 2011-12-19 DIAGNOSIS — I10 Essential (primary) hypertension: Secondary | ICD-10-CM

## 2011-12-19 LAB — BASIC METABOLIC PANEL
BUN: 23 mg/dL (ref 6–23)
CO2: 28 mEq/L (ref 19–32)
Calcium: 9.4 mg/dL (ref 8.4–10.5)
Chloride: 107 mEq/L (ref 96–112)
Creatinine, Ser: 1.1 mg/dL (ref 0.4–1.5)
GFR: 77.77 mL/min (ref >60.00)
Glucose, Bld: 161 mg/dL — ABNORMAL HIGH (ref 70–99)
Potassium: 4.4 mEq/L (ref 3.5–5.1)
Sodium: 140 mEq/L (ref 135–145)

## 2011-12-19 LAB — HEPATIC FUNCTION PANEL
ALT: 11 U/L (ref 0–53)
AST: 16 U/L (ref 0–37)
Albumin: 3.7 g/dL (ref 3.5–5.2)
Alkaline Phosphatase: 46 U/L (ref 39–117)
Bilirubin, Direct: 0 mg/dL (ref 0.0–0.3)
Total Bilirubin: 0.3 mg/dL (ref 0.3–1.2)
Total Protein: 6.9 g/dL (ref 6.0–8.3)

## 2011-12-19 LAB — LIPID PANEL
Cholesterol: 124 mg/dL (ref 0–200)
HDL: 46 mg/dL (ref >39.00)
LDL Cholesterol: 38 mg/dL (ref 0–99)
Total CHOL/HDL Ratio: 3
Triglycerides: 198 mg/dL — ABNORMAL HIGH (ref 0.0–149.0)
VLDL: 39.6 mg/dL (ref 0.0–40.0)

## 2011-12-19 LAB — LDL CHOLESTEROL, DIRECT: Direct LDL: 65.4 mg/dL

## 2011-12-19 LAB — HEMOGLOBIN A1C: Hgb A1c MFr Bld: 8.4 % — ABNORMAL HIGH (ref 4.6–6.5)

## 2011-12-19 NOTE — Progress Notes (Signed)
  Subjective:    Patient ID: [name], male    DOB: 10/23/1921, 78 y.o.   MRN: [id number]  HPI  Patient is an 78 year old male who is followed for hypertension, diabetes, hyperlipidemia and gastroesophageal reflux.  His blood pressure is stable today he measures his blood glucoses at home and states that his CBGs have been within range.  Review of Systems     Objective:   Physical Exam        Assessment & Plan:

## 2011-12-19 NOTE — Patient Instructions (Signed)
The patient is instructed to continue all medications as prescribed. Schedule followup with check out clerk upon leaving the clinic  

## 2012-01-27 ENCOUNTER — Other Ambulatory Visit: Payer: Self-pay | Admitting: *Deleted

## 2012-01-27 MED ORDER — TAMSULOSIN HCL 0.4 MG PO CAPS: 0.4000 mg | ORAL_CAPSULE | Freq: Every day | ORAL | Status: DC

## 2012-02-02 ENCOUNTER — Telehealth: Payer: Self-pay | Admitting: Internal Medicine

## 2012-02-02 NOTE — Telephone Encounter (Signed)
Pt needs glyburide-metformin 5-500mg  twice a day #180 with 3 refills sent to [facility] also please sent in to [facility] [address]. Pt has only 5 day supply left.

## 2012-02-03 ENCOUNTER — Other Ambulatory Visit: Payer: Self-pay | Admitting: *Deleted

## 2012-02-03 MED ORDER — GLYBURIDE-METFORMIN 5-500 MG PO TABS: 1.0000 | ORAL_TABLET | Freq: Two times a day (BID) | ORAL | Status: DC

## 2012-02-10 ENCOUNTER — Telehealth: Payer: Self-pay | Admitting: Family Medicine

## 2012-02-10 NOTE — Telephone Encounter (Signed)
[name] denied [name]'s Glyburide-Metformin. They state that there is not sufficient evidence that the physician has documented that the benefit outweighs potential risk, nor a sufficiently documented ongoing monitoring plan for the med. Only option is to start the appeal process. Please advise. Thanks.

## 2012-02-13 ENCOUNTER — Other Ambulatory Visit: Payer: Self-pay | Admitting: *Deleted

## 2012-02-13 ENCOUNTER — Telehealth: Payer: Self-pay | Admitting: Internal Medicine

## 2012-02-13 MED ORDER — METFORMIN HCL 500 MG PO TABS: 500.0000 mg | ORAL_TABLET | Freq: Two times a day (BID) | ORAL | Status: DC

## 2012-02-13 MED ORDER — GLYBURIDE 5 MG PO TABS: 5.0000 mg | ORAL_TABLET | Freq: Two times a day (BID) | ORAL | Status: DC

## 2012-02-13 NOTE — Telephone Encounter (Signed)
Open in error

## 2012-02-13 NOTE — Telephone Encounter (Signed)
[name], Mr. Gaffin just called & will be out of this med on Sunday. He is very concerned. Do you want to appeal [employer]'s decision? Do we have samples? Thanks!

## 2012-02-13 NOTE — Telephone Encounter (Signed)
Dr Lovell Sheehan wants him to continue-I broke the 2 meds up into 2 separate meds and sent to [facility] where it will be free--thanks for your help

## 2012-04-17 ENCOUNTER — Ambulatory Visit: Payer: Medicare HMO | Admitting: Internal Medicine

## 2012-05-16 ENCOUNTER — Ambulatory Visit (INDEPENDENT_AMBULATORY_CARE_PROVIDER_SITE_OTHER): Payer: Medicare HMO | Admitting: Internal Medicine

## 2012-05-16 ENCOUNTER — Encounter: Payer: Self-pay | Admitting: Internal Medicine

## 2012-05-16 VITALS — BP 140/78 | HR 76 | Temp 98.0°F | Resp 16 | Ht 70.0 in | Wt 204.0 lb

## 2012-05-16 DIAGNOSIS — E1142 Type 2 diabetes mellitus with diabetic polyneuropathy: Secondary | ICD-10-CM

## 2012-05-16 DIAGNOSIS — L03039 Cellulitis of unspecified toe: Secondary | ICD-10-CM

## 2012-05-16 DIAGNOSIS — L02619 Cutaneous abscess of unspecified foot: Secondary | ICD-10-CM

## 2012-05-16 DIAGNOSIS — E1149 Type 2 diabetes mellitus with other diabetic neurological complication: Secondary | ICD-10-CM

## 2012-05-16 LAB — HEMOGLOBIN A1C: Hgb A1c MFr Bld: 8.2 % — ABNORMAL HIGH (ref 4.6–6.5)

## 2012-05-16 LAB — BASIC METABOLIC PANEL
BUN: 18 mg/dL (ref 6–23)
CO2: 26 mEq/L (ref 19–32)
Calcium: 9.1 mg/dL (ref 8.4–10.5)
Chloride: 102 mEq/L (ref 96–112)
Creatinine, Ser: 1.2 mg/dL (ref 0.4–1.5)
GFR: 72.53 mL/min (ref >60.00)
Glucose, Bld: 178 mg/dL — ABNORMAL HIGH (ref 70–99)
Potassium: 4.2 mEq/L (ref 3.5–5.1)
Sodium: 138 mEq/L (ref 135–145)

## 2012-05-16 MED ORDER — MUPIROCIN 2 % EX OINT: TOPICAL_OINTMENT | Freq: Three times a day (TID) | CUTANEOUS | Status: AC

## 2012-05-16 NOTE — Patient Instructions (Signed)
The patient is instructed to continue all medications as prescribed. Schedule followup with check out clerk upon leaving the clinic  

## 2012-05-16 NOTE — Progress Notes (Signed)
Subjective:    Patient ID: [name], male    DOB: [birth year], 78 y.o.   MRN: [id number]  HPI  Patient is a 78 year old African American male who is followed for hypertension diabetes and hyperlipidemia.  He is also followed for benign prostatic hypertrophy was stable no dysuria he states his blood sugars average between 100 in the 110 and he reports no hypoglycemia he states his blood pressures but well-controlled but he has noticed swelling in his legs and worsens as the day goes on it completely resolves overnight he has no pain in his legs he has been color change nor does he have any lesion on his legs other than a crack below his left great toe which he states has appeared over the last few days  Review of Systems  Constitutional: Negative for fever and fatigue.  HENT: Negative for hearing loss, congestion, neck pain and postnasal drip.   Eyes: Negative for discharge, redness and visual disturbance.  Respiratory: Negative for cough, shortness of breath and wheezing.   Cardiovascular: Positive for leg swelling.  Gastrointestinal: Negative for abdominal pain, constipation and abdominal distention.  Genitourinary: Negative for urgency and frequency.  Musculoskeletal: Negative for joint swelling and arthralgias.  Skin: Negative for color change and rash.  Neurological: Negative for weakness and light-headedness.  Hematological: Negative for adenopathy.  Psychiatric/Behavioral: Negative for behavioral problems.   Past Medical History  Diagnosis Date  . Hyperlipidemia   . Hypertension   . Elevated PSA   . CAD (coronary artery disease)   . Diabetes mellitus   . GERD (gastroesophageal reflux disease)   . Hemorrhoids   . Hx of adenomatous colonic polyps   . Arthritis   . CAD (coronary artery disease)     post diaphragjatic wall infarction and subsequent coronary artery bypass graft surgery in 2003, now stable  . Prostate cancer     History   Social History  . Marital  Status: Divorced    Spouse Name: N/A    Number of Children: N/A  . Years of Education: N/A   Occupational History  . Retired [employer]   Social History Main Topics  . Smoking status: Former Smoker    Quit date: 10/25/1948  . Smokeless tobacco: Never Used  . Alcohol Use: No  . Drug Use: No  . Sexually Active: Not Currently   Other Topics Concern  . Not on file   Social History Narrative   4 children    Past Surgical History  Procedure Date  . Coronary artery bypass graft     Family History  Problem Relation Age of Onset  . Heart disease Mother   . Diabetes Mother     No Known Allergies  Current Outpatient Prescriptions on File Prior to Visit  Medication Sig Dispense Refill  . aspirin (ECOTRIN) 325 MG EC tablet Take 325 mg by mouth daily.        . benazepril-hydrochlorthiazide (LOTENSIN HCT) 20-12.5 MG per tablet Take 1 tablet by mouth 2 (two) times daily.  180 tablet  3  . glucose blood (FREESTYLE TEST STRIPS) test strip Use as instructed  100 each  12  . glyBURIDE (DIABETA) 5 MG tablet Take 1 tablet (5 mg total) by mouth 2 (two) times daily with a meal.  60 tablet  6  . lovastatin (MEVACOR) 20 MG tablet Take 1 tablet (20 mg total) by mouth at bedtime.  90 tablet  3  . metFORMIN (GLUCOPHAGE) 500 MG tablet Take 1 tablet (  500 mg total) by mouth 2 (two) times daily with a meal.  60 tablet  6  . Tamsulosin HCl (FLOMAX) 0.4 MG CAPS Take 1 capsule (0.4 mg total) by mouth daily.  90 capsule  3  . vitamin B-12 (CYANOCOBALAMIN) 100 MCG tablet Take 50 mcg by mouth daily.        . vitamin C (ASCORBIC ACID) 500 MG tablet Take 500 mg by mouth daily.        . vitamin E 1000 UNIT capsule Take 1,000 Units by mouth daily.          BP 140/78  Pulse 76  Temp 98 F (36.7 C)  Resp 16  Ht 5\' 10"  (1.778 m)  Wt 204 lb (92.534 kg)  BMI 29.27 kg/m2       Objective:   Physical Exam  Nursing note and vitals reviewed. Constitutional: He appears well-developed and  well-nourished.  HENT:  Head: Normocephalic and atraumatic.  Eyes: Conjunctivae are normal. Pupils are equal, round, and reactive to light.  Neck: Normal range of motion. Neck supple.  Cardiovascular: Normal rate and regular rhythm.   Pulmonary/Chest: Effort normal and breath sounds normal.  Abdominal: Soft. Bowel sounds are normal.  Musculoskeletal: He exhibits edema.          Assessment & Plan:  Patient's blood pressure stable his current medications based upon his CBGs his diabetes appears to be stable we'll measure hemoglobin A1c in look a basic metabolic panel today for stable renal function and for stability of A1c for the cellulitis under his left great toe we will treat it with local therapy with Bactroban ointment apply 3 times a day and monitoring should the lesion worsen he'll contact our office to consideration for referral to the [facility] for treatment of a diabetic ulcer.  Otherwise she will return in 3 months

## 2012-08-02 ENCOUNTER — Other Ambulatory Visit: Payer: Self-pay | Admitting: *Deleted

## 2012-08-02 MED ORDER — PRODIGY AUTOCODE BLOOD GLUCOSE DEVI: Status: DC

## 2012-08-02 MED ORDER — PRODIGY LANCETS 28G MISC: 1.0000 | Freq: Two times a day (BID) | Status: DC

## 2012-08-02 MED ORDER — GLUCOSE BLOOD VI STRP: 1.0000 | ORAL_STRIP | Freq: Two times a day (BID) | Status: DC

## 2012-08-06 ENCOUNTER — Other Ambulatory Visit: Payer: Self-pay | Admitting: *Deleted

## 2012-08-06 MED ORDER — PRODIGY LANCETS 28G MISC: 1.0000 | Freq: Two times a day (BID) | Status: DC

## 2012-08-08 ENCOUNTER — Other Ambulatory Visit: Payer: Self-pay | Admitting: *Deleted

## 2012-08-16 ENCOUNTER — Encounter: Payer: Self-pay | Admitting: Internal Medicine

## 2012-08-16 ENCOUNTER — Ambulatory Visit (INDEPENDENT_AMBULATORY_CARE_PROVIDER_SITE_OTHER): Payer: Medicare HMO | Admitting: Internal Medicine

## 2012-08-16 VITALS — BP 130/74 | HR 76 | Temp 98.3°F | Resp 18 | Ht 70.0 in | Wt 208.0 lb

## 2012-08-16 DIAGNOSIS — E785 Hyperlipidemia, unspecified: Secondary | ICD-10-CM

## 2012-08-16 DIAGNOSIS — Z79899 Other long term (current) drug therapy: Secondary | ICD-10-CM

## 2012-08-16 DIAGNOSIS — E119 Type 2 diabetes mellitus without complications: Secondary | ICD-10-CM

## 2012-08-16 DIAGNOSIS — G589 Mononeuropathy, unspecified: Secondary | ICD-10-CM

## 2012-08-16 DIAGNOSIS — I10 Essential (primary) hypertension: Secondary | ICD-10-CM

## 2012-08-16 DIAGNOSIS — K449 Diaphragmatic hernia without obstruction or gangrene: Secondary | ICD-10-CM

## 2012-08-16 DIAGNOSIS — G629 Polyneuropathy, unspecified: Secondary | ICD-10-CM

## 2012-08-16 LAB — HEMOGLOBIN A1C: Hgb A1c MFr Bld: 8.1 % — ABNORMAL HIGH (ref 4.6–6.5)

## 2012-08-16 NOTE — Patient Instructions (Addendum)
The patient is instructed to continue all medications as prescribed. Schedule followup with check out clerk upon leaving the clinic  

## 2012-08-16 NOTE — Progress Notes (Signed)
Subjective:    Patient ID: [name], male    DOB: [birth year], 78 y.o.   MRN: [id number]  HPI Patient is an 78 year old male who presents for followup of diabetes hypertension and hyperlipidemia.  His blood pressure today was rechecked at 130/74 and he states that his home blood pressure readings have been normal his CBGs at home have usually ranged around 100 210 this morning they were slightly higher than that his A1c has been stable and he presents today for a measurement of an A1c a measurement of a B12 level to 2 a history of peripheral neuropathy and a measure of her renal function for blood pressure control   Review of Systems  Constitutional: Negative for fever and fatigue.  HENT: Negative for hearing loss, congestion, neck pain and postnasal drip.   Eyes: Negative for discharge, redness and visual disturbance.  Respiratory: Negative for cough, shortness of breath and wheezing.   Cardiovascular: Negative for leg swelling.  Gastrointestinal: Negative for abdominal pain, constipation and abdominal distention.  Genitourinary: Negative for urgency and frequency.  Musculoskeletal: Positive for myalgias and joint swelling. Negative for arthralgias.  Skin: Negative for color change and rash.  Neurological: Positive for numbness. Negative for weakness and light-headedness.  Hematological: Negative for adenopathy.  Psychiatric/Behavioral: Negative for behavioral problems.   Past Medical History  Diagnosis Date  . Hyperlipidemia   . Hypertension   . Elevated PSA   . CAD (coronary artery disease)   . Diabetes mellitus   . GERD (gastroesophageal reflux disease)   . Hemorrhoids   . Hx of adenomatous colonic polyps   . Arthritis   . CAD (coronary artery disease)     post diaphragjatic wall infarction and subsequent coronary artery bypass graft surgery in 2003, now stable  . Prostate cancer     History   Social History  . Marital Status: Divorced    Spouse Name: N/A   Number of Children: N/A  . Years of Education: N/A   Occupational History  . Retired [employer]   Social History Main Topics  . Smoking status: Former Smoker    Quit date: 10/25/1948  . Smokeless tobacco: Never Used  . Alcohol Use: No  . Drug Use: No  . Sexually Active: Not Currently   Other Topics Concern  . Not on file   Social History Narrative   4 children    Past Surgical History  Procedure Date  . Coronary artery bypass graft     Family History  Problem Relation Age of Onset  . Heart disease Mother   . Diabetes Mother     No Known Allergies  Current Outpatient Prescriptions on File Prior to Visit  Medication Sig Dispense Refill  . aspirin (ECOTRIN) 325 MG EC tablet Take 325 mg by mouth daily.        . benazepril-hydrochlorthiazide (LOTENSIN HCT) 20-12.5 MG per tablet Take 1 tablet by mouth 2 (two) times daily.  180 tablet  3  . Blood Glucose Monitoring Suppl (PRODIGY AUTOCODE BLOOD GLUCOSE) DEVI Use twice daily  1 each  0  . glucose blood test strip 1 each by Other route 2 (two) times daily. Use as instructed  100 each  3  . glyBURIDE (DIABETA) 5 MG tablet Take 1 tablet (5 mg total) by mouth 2 (two) times daily with a meal.  60 tablet  6  . lovastatin (MEVACOR) 20 MG tablet Take 1 tablet (20 mg total) by mouth at bedtime.  90 tablet  3  . metFORMIN (GLUCOPHAGE) 500 MG tablet Take 1 tablet (500 mg total) by mouth 2 (two) times daily with a meal.  60 tablet  6  . PRODIGY LANCETS 28G MISC 1 each by Does not apply route 2 (two) times daily.  100 each  3  . Tamsulosin HCl (FLOMAX) 0.4 MG CAPS Take 1 capsule (0.4 mg total) by mouth daily.  90 capsule  3  . vitamin B-12 (CYANOCOBALAMIN) 100 MCG tablet Take 50 mcg by mouth daily.        . vitamin C (ASCORBIC ACID) 500 MG tablet Take 500 mg by mouth daily.        . vitamin E 1000 UNIT capsule Take 1,000 Units by mouth daily.          BP 130/74  Pulse 76  Temp 98.3 F (36.8 C)  Resp 18  Ht 5\' 10"  (1.778  m)  Wt 208 lb (94.348 kg)  BMI 29.84 kg/m2       Objective:   Physical Exam  Constitutional: He appears well-developed and well-nourished.  HENT:  Head: Normocephalic and atraumatic.  Eyes: Conjunctivae normal are normal. Pupils are equal, round, and reactive to light.  Neck: Normal range of motion. Neck supple.  Cardiovascular: Normal rate and regular rhythm.   Murmur heard. Pulmonary/Chest: Effort normal and breath sounds normal.  Abdominal: Soft. Bowel sounds are normal.          Assessment & Plan:  A basic metabolic panel and A1c and a B12 level today Patient's blood pressure stable His diabetes appears to be stable pending the results of A1c GERD is stable And the patient has a mild peripheral neuropathy for which we'll continue B12 levels

## 2012-08-20 LAB — BASIC METABOLIC PANEL
BUN: 25 mg/dL — ABNORMAL HIGH (ref 6–23)
CO2: 21 mEq/L (ref 19–32)
Calcium: 9.3 mg/dL (ref 8.4–10.5)
Chloride: 110 mEq/L (ref 96–112)
Creatinine, Ser: 1.4 mg/dL (ref 0.4–1.5)
GFR: 59.78 mL/min — ABNORMAL LOW (ref >60.00)
Glucose, Bld: 163 mg/dL — ABNORMAL HIGH (ref 70–99)
Potassium: 5 mEq/L (ref 3.5–5.1)
Sodium: 143 mEq/L (ref 135–145)

## 2012-08-20 LAB — VITAMIN B12: Vitamin B-12: 1238 pg/mL — ABNORMAL HIGH (ref 211–911)

## 2012-08-24 ENCOUNTER — Other Ambulatory Visit (HOSPITAL_COMMUNITY): Payer: Self-pay | Admitting: Urology

## 2012-08-24 DIAGNOSIS — C61 Malignant neoplasm of prostate: Secondary | ICD-10-CM

## 2012-09-07 ENCOUNTER — Encounter (HOSPITAL_COMMUNITY)
Admission: RE | Admit: 2012-09-07 | Discharge: 2012-09-07 | Disposition: A | Discharge status: Home / Self Care | Payer: Medicare HMO | Source: Ambulatory Visit | Attending: Urology | Admitting: Urology

## 2012-09-07 ENCOUNTER — Encounter (HOSPITAL_COMMUNITY): Payer: Self-pay

## 2012-09-07 DIAGNOSIS — N3289 Other specified disorders of bladder: Secondary | ICD-10-CM | POA: Insufficient documentation

## 2012-09-07 DIAGNOSIS — C61 Malignant neoplasm of prostate: Secondary | ICD-10-CM | POA: Insufficient documentation

## 2012-09-07 MED ORDER — TECHNETIUM TC 99M MEDRONATE IV KIT
25.0000 | PACK | Freq: Once | INTRAVENOUS | Status: AC | PRN
  Administered 2012-09-07: 25 via INTRAVENOUS

## 2012-09-10 ENCOUNTER — Other Ambulatory Visit (HOSPITAL_COMMUNITY): Payer: Self-pay | Admitting: Diagnostic Radiology

## 2012-09-10 DIAGNOSIS — C61 Malignant neoplasm of prostate: Secondary | ICD-10-CM

## 2012-09-18 ENCOUNTER — Ambulatory Visit (HOSPITAL_COMMUNITY)
Admission: RE | Admit: 2012-09-18 | Discharge: 2012-09-18 | Disposition: A | Discharge status: Home / Self Care | Payer: Medicare HMO | Source: Ambulatory Visit | Attending: Diagnostic Radiology | Admitting: Diagnostic Radiology

## 2012-09-18 DIAGNOSIS — C61 Malignant neoplasm of prostate: Secondary | ICD-10-CM

## 2012-09-22 ENCOUNTER — Other Ambulatory Visit: Payer: Self-pay | Admitting: Internal Medicine

## 2012-09-24 ENCOUNTER — Other Ambulatory Visit: Payer: Self-pay | Admitting: Internal Medicine

## 2012-09-24 MED ORDER — GLYBURIDE 5 MG PO TABS: 5.0000 mg | ORAL_TABLET | Freq: Two times a day (BID) | ORAL | Status: DC

## 2012-09-24 MED ORDER — METFORMIN HCL 500 MG PO TABS: 500.0000 mg | ORAL_TABLET | Freq: Two times a day (BID) | ORAL | Status: DC

## 2012-09-24 NOTE — Telephone Encounter (Signed)
Pt needs refills on metformin 500 mg and glyburide 5 mg twice a day on both meds. Please call [facility] [phone].

## 2012-10-01 ENCOUNTER — Other Ambulatory Visit (HOSPITAL_COMMUNITY): Payer: Self-pay | Admitting: Urology

## 2012-10-01 DIAGNOSIS — C61 Malignant neoplasm of prostate: Secondary | ICD-10-CM

## 2012-10-08 ENCOUNTER — Other Ambulatory Visit: Payer: Self-pay | Admitting: Internal Medicine

## 2012-10-30 ENCOUNTER — Ambulatory Visit (INDEPENDENT_AMBULATORY_CARE_PROVIDER_SITE_OTHER): Payer: Medicare HMO | Admitting: Family Medicine

## 2012-10-30 ENCOUNTER — Telehealth: Payer: Self-pay | Admitting: Internal Medicine

## 2012-10-30 ENCOUNTER — Encounter: Payer: Self-pay | Admitting: Family Medicine

## 2012-10-30 VITALS — BP 130/70 | HR 98 | Temp 98.3°F | Wt 200.0 lb

## 2012-10-30 DIAGNOSIS — E119 Type 2 diabetes mellitus without complications: Secondary | ICD-10-CM

## 2012-10-30 NOTE — Progress Notes (Signed)
  Subjective:    Patient ID: [name], male    DOB: Nov 18, 1920, 79 y.o.   MRN: [id number]  HPI Here for 3 weeks of high blood sugars. He had been taking separate prescriptions for Metformin and Glyburide for years, and his glucoses had been stable. His last A1c in October was 8.1, which is about what it has run for several years. Then 3 weeks ago ( at exactly the same time that his glucoses went up) he switched to a combined pill from [employer]. Since then his glucoses have been in the 200s and 300s. He feels fine in general.    Review of Systems  Constitutional: Negative.   Respiratory: Negative.   Cardiovascular: Negative.   Gastrointestinal: Negative.   Genitourinary: Negative.        Objective:   Physical Exam  Constitutional: He appears well-developed and well-nourished. No distress.  Cardiovascular: Normal rate, regular rhythm, normal heart sounds and intact distal pulses.   Pulmonary/Chest: Effort normal and breath sounds normal.          Assessment & Plan:  The most likely explanation is that the new generic medication contains less effective medication than the separate meds he had been taking. We agreed for him to go back to the separate Glyburide and Metformin regimen since he still has these at home. He will watch the glucoses closely over the next week and let [name] know what happens.

## 2012-10-30 NOTE — Telephone Encounter (Signed)
[facility] Triage Call Report Triage Record Num: [id number] Operator: Kelle Darting Patient Name: [name] Call Date & Time: 10/29/2012 5:40:04PM Patient Phone: [phone] PCP: Darryll Capers Patient Gender: Male PCP Fax : [phone] Patient DOB: [birth year] [facility] Name[facility] Reason for Call: Caller: [name]/Patient; PCP: Darryll Capers (Adults only); CB#: [phone]; Call regarding Blood sugar reading high; Afebrile; Onset: 10/22/12; Sx notes: Blood sugars have been reading higher than normal for the past week, states even without eating anything they are reading higher, 190's upon waking, took medication, did not eat and then ate and then blood sugar at 303 (last reading at 1530), denies any sx at all, just readings being higher than normal; at this time blood sugar is at 223; Dr. wants blood sugar to be below 150; Guideline used: Diabetes: Control problems; Disposition: See provider within 4 hours due to Glucose out of control as defined by provider or action plan AND taking medications/following therapy as prescribed; Spoke with Dr. Yetta Barre regarding same, states that it is fine for patient to wait until tomorrow to be seen; Appt. made: Yes, with Dr. Clent Ridges ([name] is unavailable) on 10/30/12 for 1400. Protocol(s) Used: Diabetes: Control Problems Recommended Outcome per Protocol: See Provider within 4 hours Override Outcome if Used in Protocol: See Provider within 24 hours RN Reason for Override Outcome: Physician Orders. Reason for Outcome: New or increasing symptoms or glucose out of control as defined by provider or action plan AND taking medications/following therapy as prescribed Physician Instructions / Orders: Spoke with Dr. Yetta Barre regarding same, states that it is fine for patient to wait until tomorrow to be seen. Care Advice: ~ Call provider if symptoms worsen before scheduled appointment. 01/06/

## 2012-11-08 ENCOUNTER — Emergency Department (HOSPITAL_COMMUNITY)
Admission: EM | Admit: 2012-11-08 | Discharge: 2012-11-09 | Disposition: A | Discharge status: Home / Self Care | Payer: Medicare HMO | Attending: Emergency Medicine | Admitting: Emergency Medicine

## 2012-11-08 ENCOUNTER — Encounter (HOSPITAL_COMMUNITY): Payer: Self-pay | Admitting: Emergency Medicine

## 2012-11-08 DIAGNOSIS — Z860100 Personal history of colon polyps, unspecified: Secondary | ICD-10-CM | POA: Insufficient documentation

## 2012-11-08 DIAGNOSIS — E785 Hyperlipidemia, unspecified: Secondary | ICD-10-CM | POA: Insufficient documentation

## 2012-11-08 DIAGNOSIS — M130 Polyarthritis, unspecified: Secondary | ICD-10-CM | POA: Insufficient documentation

## 2012-11-08 DIAGNOSIS — Z87891 Personal history of nicotine dependence: Secondary | ICD-10-CM | POA: Insufficient documentation

## 2012-11-08 DIAGNOSIS — E1165 Type 2 diabetes mellitus with hyperglycemia: Secondary | ICD-10-CM

## 2012-11-08 DIAGNOSIS — E1110 Type 2 diabetes mellitus with ketoacidosis without coma: Secondary | ICD-10-CM | POA: Insufficient documentation

## 2012-11-08 DIAGNOSIS — R739 Hyperglycemia, unspecified: Secondary | ICD-10-CM

## 2012-11-08 DIAGNOSIS — K649 Unspecified hemorrhoids: Secondary | ICD-10-CM | POA: Insufficient documentation

## 2012-11-08 DIAGNOSIS — K219 Gastro-esophageal reflux disease without esophagitis: Secondary | ICD-10-CM | POA: Insufficient documentation

## 2012-11-08 DIAGNOSIS — Z794 Long term (current) use of insulin: Secondary | ICD-10-CM | POA: Insufficient documentation

## 2012-11-08 DIAGNOSIS — I2510 Atherosclerotic heart disease of native coronary artery without angina pectoris: Secondary | ICD-10-CM | POA: Insufficient documentation

## 2012-11-08 DIAGNOSIS — Z8546 Personal history of malignant neoplasm of prostate: Secondary | ICD-10-CM | POA: Insufficient documentation

## 2012-11-08 DIAGNOSIS — Z7982 Long term (current) use of aspirin: Secondary | ICD-10-CM | POA: Insufficient documentation

## 2012-11-08 DIAGNOSIS — I10 Essential (primary) hypertension: Secondary | ICD-10-CM | POA: Insufficient documentation

## 2012-11-08 LAB — GLUCOSE, CAPILLARY: Glucose-Capillary: 434 mg/dL — ABNORMAL HIGH (ref 70–99)

## 2012-11-08 MED ORDER — SODIUM CHLORIDE 0.9 % IV BOLUS (SEPSIS)
500.0000 mL | Freq: Once | INTRAVENOUS | Status: AC
  Administered 2012-11-08: 500 mL via INTRAVENOUS

## 2012-11-08 NOTE — ED Notes (Signed)
Report given via EMS. Pt c/o hyperglycemia. 347 cbg upon EMS arrival at 2253. 15 minutes later cbg was 373 at 2308.  Pt stated glucometer reported 500 cbg. BP 150/80 HR 80 RR 18 at 2254. Hx of DM, HTN, Hyperlipidemia, BPH. NKA.

## 2012-11-08 NOTE — ED Notes (Signed)
MD at bedside. 

## 2012-11-08 NOTE — ED Provider Notes (Signed)
History     CSN: [id number]  Arrival date & time 11/08/12  2314   First MD Initiated Contact with Patient 11/08/12 2316      Chief Complaint  Patient presents with  . Hyperglycemia    (Consider location/radiation/quality/duration/timing/severity/associated sxs/prior treatment) HPI Comments: [name] presents from home for evaluation of elevated blood sugars.  He reports calling EMS to his home twice today.  Both times, his blood sugar read greater than 500.  He denies feeling ill in any way.  He denies polyuria and polydipsia.  He reports no diet changes or febrile illnesses.  The history is provided by the patient and the EMS personnel. No language interpreter was used.    Past Medical History  Diagnosis Date  . Hyperlipidemia   . Hypertension   . Elevated PSA   . CAD (coronary artery disease)   . Diabetes mellitus   . GERD (gastroesophageal reflux disease)   . Hemorrhoids   . Hx of adenomatous colonic polyps   . Arthritis   . CAD (coronary artery disease)     post diaphragjatic wall infarction and subsequent coronary artery bypass graft surgery in 2003, now stable  . Prostate cancer     Past Surgical History  Procedure Date  . Coronary artery bypass graft     Family History  Problem Relation Age of Onset  . Heart disease Mother   . Diabetes Mother     History  Substance Use Topics  . Smoking status: Former Smoker    Quit date: 10/25/1948  . Smokeless tobacco: Never Used  . Alcohol Use: No      Review of Systems  All other systems reviewed and are negative.    Allergies  Review of patient's allergies indicates no known allergies.  Home Medications   Current Outpatient Rx  Name  Route  Sig  Dispense  Refill  . ASPIRIN 325 MG PO TBEC   Oral   Take 325 mg by mouth daily.           Marland Kitchen BENAZEPRIL-HYDROCHLOROTHIAZIDE 20-12.5 MG PO TABS   Oral   Take 1 tablet by mouth 2 (two) times daily.   180 tablet   3   . PRODIGY AUTOCODE BLOOD GLUCOSE  DEVI      Use twice daily   1 each   0   . GLUCOSE BLOOD VI STRP   Other   1 each by Other route 2 (two) times daily. Use as instructed   100 each   3   . GLYBURIDE 5 MG PO TABS   Oral   Take 1 tablet (5 mg total) by mouth 2 (two) times daily with a meal.   60 tablet   5   . LOVASTATIN 20 MG PO TABS   Oral   Take 1 tablet (20 mg total) by mouth at bedtime.   90 tablet   3   . METFORMIN HCL 500 MG PO TABS   Oral   Take 1 tablet (500 mg total) by mouth 2 (two) times daily with a meal.   60 tablet   5   . PRODIGY LANCETS 28G MISC   Does not apply   1 each by Does not apply route 2 (two) times daily.   100 each   3   . TAMSULOSIN HCL 0.4 MG PO CAPS   Oral   Take 1 capsule (0.4 mg total) by mouth daily.   90 capsule   3   . VITAMIN B-12  100 MCG PO TABS   Oral   Take 50 mcg by mouth daily.           Marland Kitchen VITAMIN C 500 MG PO TABS   Oral   Take 500 mg by mouth daily.           Marland Kitchen VITAMIN E 1000 UNITS PO CAPS   Oral   Take 1,000 Units by mouth daily.             BP 139/69  Pulse 83  Temp 98.1 F (36.7 C) (Oral)  Resp 21  SpO2 100%  Physical Exam  Nursing note and vitals reviewed. Constitutional: He is oriented to person, place, and time. He appears well-developed and well-nourished. No distress.  HENT:  Head: Normocephalic and atraumatic.  Right Ear: External ear normal.  Left Ear: External ear normal.  Mouth/Throat: Oropharynx is clear and moist. No oropharyngeal exudate.  Eyes: Conjunctivae normal are normal. Pupils are equal, round, and reactive to light. Right eye exhibits no discharge. Left eye exhibits no discharge. No scleral icterus.  Neck: Normal range of motion. Neck supple. No JVD present. No tracheal deviation present.  Cardiovascular: Normal rate, normal heart sounds and intact distal pulses.  Exam reveals no gallop and no friction rub.   No murmur heard. Pulmonary/Chest: Breath sounds normal. No stridor. No respiratory distress. He has  no wheezes. He has no rales. He exhibits no tenderness.  Abdominal: Soft. Bowel sounds are normal. There is no tenderness. There is no rebound and no guarding.  Musculoskeletal: Normal range of motion. He exhibits no edema and no tenderness.  Lymphadenopathy:    He has no cervical adenopathy.  Neurological: He is alert and oriented to person, place, and time. No cranial nerve deficit. Coordination (note steady, confident gait) normal.  Skin: Skin is warm and dry. No rash noted. He is not diaphoretic. No erythema. No pallor.  Psychiatric: He has a normal mood and affect. His behavior is normal.    ED Course  Procedures (including critical care time)   Labs Reviewed  BASIC METABOLIC PANEL   No results found.   No diagnosis found.    MDM  Pt presents for evaluation of an elevated blood sugar.  He reports taking the metformin and glyburide as prescribed without missing doses.  He appears nontoxic, note stable VS, NAD.  Will obtain a bedside FSBS and BMP.  Will initiate a small IVF bolus while awaiting the results.  He clinically appears well.    0330.  Pt stable, NAD.  Blood sugar improved after insulin and IVF.  He has a f/u appt with his PMD in < 24 hours.  Plan D/C home.    Tobin Chad, MD 11/09/12 [phone]

## 2012-11-08 NOTE — ED Notes (Signed)
AOZ:[id number]<VH> Expected date:<BR> Expected time:<BR> Means of arrival:<BR> Comments:<BR> EMS-79 year old male with hyperglycemia

## 2012-11-09 ENCOUNTER — Encounter: Payer: Self-pay | Admitting: Internal Medicine

## 2012-11-09 ENCOUNTER — Ambulatory Visit (INDEPENDENT_AMBULATORY_CARE_PROVIDER_SITE_OTHER): Payer: Medicare HMO | Admitting: Internal Medicine

## 2012-11-09 VITALS — BP 150/80 | HR 80 | Temp 98.3°F | Resp 16 | Ht 70.0 in | Wt 202.0 lb

## 2012-11-09 DIAGNOSIS — N390 Urinary tract infection, site not specified: Secondary | ICD-10-CM

## 2012-11-09 DIAGNOSIS — E119 Type 2 diabetes mellitus without complications: Secondary | ICD-10-CM

## 2012-11-09 DIAGNOSIS — Z794 Long term (current) use of insulin: Secondary | ICD-10-CM

## 2012-11-09 DIAGNOSIS — IMO0002 Reserved for concepts with insufficient information to code with codable children: Secondary | ICD-10-CM

## 2012-11-09 DIAGNOSIS — E1165 Type 2 diabetes mellitus with hyperglycemia: Secondary | ICD-10-CM

## 2012-11-09 DIAGNOSIS — E1169 Type 2 diabetes mellitus with other specified complication: Secondary | ICD-10-CM

## 2012-11-09 LAB — CBC WITH DIFFERENTIAL/PLATELET
Basophils Absolute: 0 10*3/uL (ref 0.0–0.1)
Basophils Relative: 0.1 % (ref 0.0–3.0)
Eosinophils Absolute: 0.2 10*3/uL (ref 0.0–0.7)
Eosinophils Relative: 2.1 % (ref 0.0–5.0)
HCT: 36.2 % — ABNORMAL LOW (ref 39.0–52.0)
Hemoglobin: 11.7 g/dL — ABNORMAL LOW (ref 13.0–17.0)
Lymphocytes Relative: 17.8 % (ref 12.0–46.0)
Lymphs Abs: 1.8 10*3/uL (ref 0.7–4.0)
MCHC: 32.4 g/dL (ref 30.0–36.0)
MCV: 95.5 fl (ref 78.0–100.0)
Monocytes Absolute: 0.7 10*3/uL (ref 0.1–1.0)
Monocytes Relative: 7.1 % (ref 3.0–12.0)
Neutro Abs: 7.3 10*3/uL (ref 1.4–7.7)
Neutrophils Relative %: 72.9 % (ref 43.0–77.0)
Platelets: 360 10*3/uL (ref 150.0–400.0)
RBC: 3.79 Mil/uL — ABNORMAL LOW (ref 4.22–5.81)
RDW: 13.4 % (ref 11.5–14.6)
WBC: 10 10*3/uL (ref 4.5–10.5)

## 2012-11-09 LAB — BASIC METABOLIC PANEL
BUN: 35 mg/dL — ABNORMAL HIGH (ref 6–23)
CO2: 24 mEq/L (ref 19–32)
Calcium: 9.4 mg/dL (ref 8.4–10.5)
Chloride: 97 mEq/L (ref 96–112)
Creatinine, Ser: 1.37 mg/dL — ABNORMAL HIGH (ref 0.50–1.35)
GFR calc Af Amer: 51 mL/min — ABNORMAL LOW (ref >90)
GFR calc non Af Amer: 44 mL/min — ABNORMAL LOW (ref >90)
Glucose, Bld: 405 mg/dL — ABNORMAL HIGH (ref 70–99)
Potassium: 4.7 mEq/L (ref 3.5–5.1)
Sodium: 132 mEq/L — ABNORMAL LOW (ref 135–145)

## 2012-11-09 LAB — GLUCOSE, CAPILLARY
Glucose-Capillary: 263 mg/dL — ABNORMAL HIGH (ref 70–99)
Glucose-Capillary: 322 mg/dL — ABNORMAL HIGH (ref 70–99)
Glucose-Capillary: 353 mg/dL — ABNORMAL HIGH (ref 70–99)

## 2012-11-09 MED ORDER — CIPROFLOXACIN HCL 250 MG PO TABS: 250.0000 mg | ORAL_TABLET | Freq: Two times a day (BID) | ORAL | Status: DC

## 2012-11-09 MED ORDER — INSULIN ASPART 100 UNIT/ML ~~LOC~~ SOLN
5.0000 [IU] | Freq: Once | SUBCUTANEOUS | Status: AC
  Administered 2012-11-09: 5 [IU] via SUBCUTANEOUS
  Filled 2012-11-09: qty 1

## 2012-11-09 MED ORDER — INSULIN REGULAR HUMAN 100 UNIT/ML IJ SOLN: 5.0000 [IU] | Freq: Once | INTRAMUSCULAR | Status: DC

## 2012-11-09 NOTE — Patient Instructions (Signed)
You give yourself 15 units of 75/25 insulin before breakfast and before supper  Blood sugar is less than 100 your shot  Should be only 5 units If blood sugar is greater than 250 we will increase it to 20 units

## 2012-11-09 NOTE — Progress Notes (Signed)
Subjective:    Patient ID: [name], male    DOB: [birth year], 79 y.o.   MRN: [id number]  HPI  Has been seeing urologist and using a catheter Has not been on antibiotics Has been feeling very poorly Was seen in the ER for elevated blood glucose of 409! The sugars seem to be very labile  Patient is now long-standing adult-onset diabetic he has had a 1 cc around 8 for some time now I suspect that he has developed increased insulin resistance and decreased insulin production from his pancreas.  We have discussed today the initiation of insulin therapy  Review of Systems  Constitutional: Positive for fatigue. Negative for fever.  HENT: Negative for hearing loss, congestion, neck pain and postnasal drip.   Eyes: Negative for discharge, redness and visual disturbance.  Respiratory: Negative for cough, shortness of breath and wheezing.   Cardiovascular: Negative for leg swelling.  Gastrointestinal: Negative for abdominal pain, constipation and abdominal distention.  Genitourinary: Positive for urgency and frequency.  Musculoskeletal: Negative for joint swelling and arthralgias.  Skin: Negative for color change and rash.  Neurological: Negative for weakness and light-headedness.  Hematological: Negative for adenopathy.  Psychiatric/Behavioral: Negative for behavioral problems.   Past Medical History  Diagnosis Date  . Hyperlipidemia   . Hypertension   . Elevated PSA   . CAD (coronary artery disease)   . Diabetes mellitus   . GERD (gastroesophageal reflux disease)   . Hemorrhoids   . Hx of adenomatous colonic polyps   . Arthritis   . CAD (coronary artery disease)     post diaphragjatic wall infarction and subsequent coronary artery bypass graft surgery in 2003, now stable  . Prostate cancer     History   Social History  . Marital Status: Divorced    Spouse Name: N/A    Number of Children: N/A  . Years of Education: N/A   Occupational History  . Retired [employer]   Social History Main Topics  . Smoking status: Former Smoker    Quit date: 10/25/1948  . Smokeless tobacco: Never Used  . Alcohol Use: No  . Drug Use: No  . Sexually Active: Not Currently   Other Topics Concern  . Not on file   Social History Narrative   4 children    Past Surgical History  Procedure Date  . Coronary artery bypass graft     Family History  Problem Relation Age of Onset  . Heart disease Mother   . Diabetes Mother     No Known Allergies  Current Outpatient Prescriptions on File Prior to Visit  Medication Sig Dispense Refill  . aspirin (ECOTRIN) 325 MG EC tablet Take 325 mg by mouth daily.        . benazepril-hydrochlorthiazide (LOTENSIN HCT) 20-12.5 MG per tablet Take 1 tablet by mouth 2 (two) times daily.  180 tablet  3  . finasteride (PROSCAR) 5 MG tablet Take 5 mg by mouth daily.      Marland Kitchen lovastatin (MEVACOR) 20 MG tablet Take 1 tablet (20 mg total) by mouth at bedtime.  90 tablet  3  . Tamsulosin HCl (FLOMAX) 0.4 MG CAPS Take 1 capsule (0.4 mg total) by mouth daily.  90 capsule  3  . vitamin B-12 (CYANOCOBALAMIN) 100 MCG tablet Take 50 mcg by mouth daily.        . vitamin C (ASCORBIC ACID) 500 MG tablet Take 500 mg by mouth daily.        Marland Kitchen  vitamin E 1000 UNIT capsule Take 1,000 Units by mouth daily.          BP 150/80  Pulse 80  Temp 98.3 F (36.8 C)  Resp 16  Ht 5\' 10"  (1.778 m)  Wt 202 lb (91.627 kg)  BMI 28.98 kg/m2       Objective:   Physical Exam  Nursing note and vitals reviewed. Constitutional: He appears well-developed and well-nourished.  HENT:  Head: Normocephalic and atraumatic.  Eyes: Conjunctivae normal are normal. Pupils are equal, round, and reactive to light.  Neck: Normal range of motion. Neck supple.  Cardiovascular: Normal rate and regular rhythm.   Pulmonary/Chest: Effort normal and breath sounds normal.  Abdominal: Soft. Bowel sounds are normal.    Type II diabetic now requiring insulin therapy we  can put him on a combination in pH and fast acting insulin shot twice daily with a sliding scale adjustment He will need to followup in 3-4 weeks' time      Assessment & Plan:  We will start him on the an insulin pen he'll be taught touse it We will also establish a sliding scale Take Cipro for 250 twice a day for 14 days we will monitor his urine culture and adjust the antibiotics as indicated

## 2012-11-09 NOTE — ED Notes (Addendum)
[facility] contacted 

## 2012-11-12 ENCOUNTER — Ambulatory Visit (HOSPITAL_COMMUNITY)
Admission: RE | Admit: 2012-11-12 | Discharge: 2012-11-12 | Disposition: A | Discharge status: Home / Self Care | Payer: Medicare HMO | Source: Ambulatory Visit | Attending: Urology | Admitting: Urology

## 2012-11-12 ENCOUNTER — Telehealth: Payer: Self-pay | Admitting: Internal Medicine

## 2012-11-12 DIAGNOSIS — C61 Malignant neoplasm of prostate: Secondary | ICD-10-CM | POA: Insufficient documentation

## 2012-11-12 NOTE — Telephone Encounter (Signed)
[facility] Triage Call Report Triage Record Num: [id number] Operator: Jeraldine Loots Patient Name: [name] Call Date & Time: 11/10/2012 11:04:10AM Patient Phone: [phone] PCP: Darryll Capers Patient Gender: Male PCP Fax : [phone] Patient DOB: 03-24-1921 [facility] Name: [facility] Reason for Call: Caller: Vineeth/Patient; PCP: Darryll Capers (Adults only); CB#: [phone]; Call regarding Medication Issue; Medication(s): insulin States that he was in the office Friday 1/17 and was given an insulin kit but left the insulin on the exam table. He cannot afford to buy another bottle of insulin. I called [facility] and they did not have an order for the insulin. Per his chart, he was given an insulin pen. Patient had forgot that the insulin was in the pen. i walked him through changing the needle and giving the correct amount of insulin. States that he is alone and has no one to help him. I remained on the phone with him until he figured out how to change the needle, dial the appropriate amount of insulin and give his injection. Below are his orders found in EPIC You give yourself 15 units of 75/25 insulin before breakfast and before supper Blood sugar is less than 100 your shot Should be only 5 units If blood sugar is greater than 250 we will increase it to 20 units Protocol(s) Used: Office Note Recommended Outcome per Protocol: Information Noted and Sent to Office Reason for Outcome: Caller information to office Care Advice: ~ 01/18/

## 2012-11-12 NOTE — Telephone Encounter (Signed)
I talked with pt and everything is ok now, he understands

## 2012-11-13 ENCOUNTER — Telehealth: Payer: Self-pay | Admitting: Internal Medicine

## 2012-11-13 ENCOUNTER — Ambulatory Visit (INDEPENDENT_AMBULATORY_CARE_PROVIDER_SITE_OTHER)
Admission: RE | Admit: 2012-11-13 | Discharge: 2012-11-13 | Disposition: A | Discharge status: Home / Self Care | Payer: Medicare HMO | Source: Ambulatory Visit | Attending: Internal Medicine | Admitting: Internal Medicine

## 2012-11-13 DIAGNOSIS — K5900 Constipation, unspecified: Secondary | ICD-10-CM

## 2012-11-13 LAB — URINE CULTURE: Colony Count: >=100000

## 2012-11-13 NOTE — Telephone Encounter (Signed)
Per dr Lovell Sheehan protocol for constipation- no bm in 1 week- needs to go to [facility] for flat plate a nd upright -pt informed

## 2012-11-13 NOTE — Telephone Encounter (Signed)
Pt states he has tried and taken everything, and cannot produce a BM for nearly a week. Please advise and call.

## 2012-11-13 NOTE — Telephone Encounter (Signed)
Please have him call  Call a nurse-they have protocol they can give him

## 2012-11-13 NOTE — Telephone Encounter (Signed)
Flat plate and upright showed no obstruction, just stool- per dr swords ,take 1 tbsl of mag citrate every hour until bm also give fleets enema after warming solution- pt instructed to call back if no results

## 2012-11-14 NOTE — Telephone Encounter (Signed)
Xray was neg for obstruction - drink 2 tbslp of citrate of mag every 2 hours until bm--  Calls today 1-22 stating he has not had bm yet. Talked with pt and he states he made appointment with dr Marina Goodell for 1-27--suggested he callo back and talk with triage nurse and tell her his condition, and possibly they can get him in sooner

## 2012-11-16 ENCOUNTER — Telehealth: Payer: Self-pay | Admitting: Internal Medicine

## 2012-11-16 NOTE — Telephone Encounter (Signed)
Patient is aware.  He has an appointment 11/23/12.

## 2012-11-16 NOTE — Telephone Encounter (Signed)
Tried to call patient but unable to leave message at number given.

## 2012-11-16 NOTE — Telephone Encounter (Signed)
Increase to 20 units bid.  Sometimes taking cipro can raise blood sugar.  I suggest f/u appt within 1 week.  Pt should be glucometer to OV.

## 2012-11-16 NOTE — Telephone Encounter (Signed)
Please advise 

## 2012-11-16 NOTE — Telephone Encounter (Signed)
Patient is taking Humalog 75/25 pen 15 units twice daily.   His glucose readings were 273 this morning before breakfast and 298 after breakfast.  His has used 15 units today.  Please advise.

## 2012-11-16 NOTE — Telephone Encounter (Signed)
Please clarify message.  Did he take epi pen or short acting insuline.  I don't see either on med list.

## 2012-11-16 NOTE — Telephone Encounter (Signed)
Patient Information:  Caller Name: [name]  Phone: [phone]  Patient: [name], [name]  Gender: Male  DOB: June 29, 1921  Age: 79 Years  PCP: [name] (Adults only)  Office Follow Up:  Does the office need to follow up with this patient?: Yes  Instructions For The Office: call back after reviewing with [name]  RN Note:  Encouraged him to eat lunch and RN would discuss his results with MD and call him back within the hour.  Symptoms  Reason For Call & Symptoms: Patient calling, his fasting blood sugar was 223mg  this am, had oatmeal for breakfast and now his blood sugar is 273mg .  He took his 15u per Epi pen. He hasn't eaten lunch because he is afraid that it will make his blood sugar go even higher.  Denies any illness or sx.  Continues on the Cipro given bid x 14 days .  His urine is now clear in color, no blood.  Reviewed Health History In EMR: Yes  Reviewed Medications In EMR: Yes  Reviewed Allergies In EMR: Yes  Reviewed Surgeries / Procedures: Yes  Date of Onset of Symptoms: 11/13/2012  Guideline(s) Used:  No Protocol Available - Information Only  Disposition Per Guideline:   Discuss with PCP and Callback by Nurse within 1 Hour  Reason For Disposition Reached:   Nursing judgment  Advice Given:  N/A

## 2012-11-19 ENCOUNTER — Ambulatory Visit: Payer: Medicare HMO | Admitting: Internal Medicine

## 2012-11-19 ENCOUNTER — Other Ambulatory Visit: Payer: Self-pay | Admitting: Internal Medicine

## 2012-11-19 MED ORDER — NITROFURANTOIN MONOHYD MACRO 100 MG PO CAPS: 100.0000 mg | ORAL_CAPSULE | Freq: Two times a day (BID) | ORAL | Status: DC

## 2012-11-19 NOTE — Telephone Encounter (Signed)
Patient Information:  Caller Name: Cornelieus  Phone: [phone]  Patient: [name], [name]  Gender: Male  DOB: Jun 10, 1921  Age: 79 Years  PCP: Darryll Capers (Adults only)  Office Follow Up:  Does the office need to follow up with this patient?: Yes  Instructions For The Office: elevated blood sugar  RN Note:  He will drink 8oz water now and again in 1 hour.  Needs f/u call from the office.  He was concerned that the pen was not dispensing the insulin so I had him set it to 5units and "inject" onto a paper towel.  He was able to see the insulin and was satisfied that same is working.  Symptoms  Reason For Call & Symptoms: Patient calling, his FBS was  327mg  this am.  He took 20u as directed and then ate breakfast and lunch and just rechecked his blood sugar and it is 494mg .  He had 3 small waffles with sugar free syrup for breakfast; black eyed peas and cabbage for lunch with 2 slices of loaf bread.    Reviewed Health History In EMR: Yes  Reviewed Medications In EMR: Yes  Reviewed Allergies In EMR: Yes  Reviewed Surgeries / Procedures: Yes  Date of Onset of Symptoms: 11/19/2012  Guideline(s) Used:  Diabetes - High Blood Sugar  Disposition Per Guideline:   Discuss with PCP and Callback by Nurse within 1 Hour  Reason For Disposition Reached:   Blood glucose > 400 mg/dl (22 mmol/l)  Advice Given:  N/A

## 2012-11-19 NOTE — Progress Notes (Signed)
Pt informed and med sent in 

## 2012-11-23 ENCOUNTER — Encounter: Payer: Self-pay | Admitting: Internal Medicine

## 2012-11-23 ENCOUNTER — Ambulatory Visit (INDEPENDENT_AMBULATORY_CARE_PROVIDER_SITE_OTHER): Payer: Medicare HMO | Admitting: Internal Medicine

## 2012-11-23 VITALS — BP 120/78 | HR 68 | Temp 97.8°F | Wt 199.0 lb

## 2012-11-23 DIAGNOSIS — E1165 Type 2 diabetes mellitus with hyperglycemia: Secondary | ICD-10-CM

## 2012-11-23 DIAGNOSIS — IMO0002 Reserved for concepts with insufficient information to code with codable children: Secondary | ICD-10-CM

## 2012-11-23 MED ORDER — "INSULIN SYRINGE-NEEDLE U-100 31G X 5/16"" 0.5 ML MISC": 1.0000 | Freq: Two times a day (BID) | Status: DC

## 2012-11-23 MED ORDER — INSULIN LISPRO PROT & LISPRO (75-25 MIX) 100 UNIT/ML ~~LOC~~ SUSP: 15.0000 [IU] | Freq: Two times a day (BID) | SUBCUTANEOUS | Status: DC

## 2012-11-23 NOTE — Progress Notes (Signed)
Subjective:    Patient ID: [name], male    DOB: 07-Feb-1921, 79 y.o.   MRN: [id number]  HPI The patient has begun a two-week trial of twice a day 75/25 Humalog mix Gradually his sugars have come down in the last 2 days they have been between 110 and 90 He is tolerating the insulin well has not had a hypoglycemic event although he did adjust his insulin down this morning based upon a sliding scale we discussed   Review of Systems  Constitutional: Negative for fever and fatigue.  HENT: Negative for hearing loss, congestion, neck pain and postnasal drip.   Eyes: Positive for visual disturbance. Negative for discharge and redness.  Respiratory: Negative for cough, shortness of breath and wheezing.   Cardiovascular: Negative for leg swelling.  Gastrointestinal: Positive for abdominal distention. Negative for abdominal pain and constipation.  Genitourinary: Negative for urgency and frequency.  Musculoskeletal: Positive for myalgias and joint swelling. Negative for arthralgias.  Skin: Negative for color change and rash.  Neurological: Positive for weakness. Negative for light-headedness.  Hematological: Negative for adenopathy.  Psychiatric/Behavioral: Negative for behavioral problems.   Past Medical History  Diagnosis Date  . Hyperlipidemia   . Hypertension   . Elevated PSA   . CAD (coronary artery disease)   . Diabetes mellitus   . GERD (gastroesophageal reflux disease)   . Hemorrhoids   . Hx of adenomatous colonic polyps   . Arthritis   . CAD (coronary artery disease)     post diaphragjatic wall infarction and subsequent coronary artery bypass graft surgery in 2003, now stable  . Prostate cancer     History   Social History  . Marital Status: Divorced    Spouse Name: N/A    Number of Children: N/A  . Years of Education: N/A   Occupational History  . Retired [employer]   Social History Main Topics  . Smoking status: Former Smoker    Quit date:  10/25/1948  . Smokeless tobacco: Never Used  . Alcohol Use: No  . Drug Use: No  . Sexually Active: Not Currently   Other Topics Concern  . Not on file   Social History Narrative   4 children    Past Surgical History  Procedure Date  . Coronary artery bypass graft     Family History  Problem Relation Age of Onset  . Heart disease Mother   . Diabetes Mother     No Known Allergies  Current Outpatient Prescriptions on File Prior to Visit  Medication Sig Dispense Refill  . aspirin (ECOTRIN) 325 MG EC tablet Take 325 mg by mouth daily.        . benazepril-hydrochlorthiazide (LOTENSIN HCT) 20-12.5 MG per tablet Take 1 tablet by mouth 2 (two) times daily.  180 tablet  3  . ciprofloxacin (CIPRO) 250 MG tablet Take 1 tablet (250 mg total) by mouth 2 (two) times daily.  28 tablet  0  . finasteride (PROSCAR) 5 MG tablet Take 5 mg by mouth daily.      . insulin lispro protamine-insulin lispro (HUMALOG 75/25) (75-25) 100 UNIT/ML SUSP Inject 15 Units into the skin 2 (two) times daily with a meal.      . lovastatin (MEVACOR) 20 MG tablet Take 1 tablet (20 mg total) by mouth at bedtime.  90 tablet  3  . nitrofurantoin, macrocrystal-monohydrate, (MACROBID) 100 MG capsule Take 1 capsule (100 mg total) by mouth 2 (two) times daily.  28 capsule  0  .  Tamsulosin HCl (FLOMAX) 0.4 MG CAPS Take 1 capsule (0.4 mg total) by mouth daily.  90 capsule  3  . vitamin B-12 (CYANOCOBALAMIN) 100 MCG tablet Take 50 mcg by mouth daily.        . vitamin C (ASCORBIC ACID) 500 MG tablet Take 500 mg by mouth daily.          BP 120/78  Pulse 68  Temp 97.8 F (36.6 C) (Oral)  Wt 199 lb (90.266 kg)       Objective:   Physical Exam  Nursing note and vitals reviewed. Constitutional: He appears well-developed and well-nourished.  Eyes: Conjunctivae normal are normal. Pupils are equal, round, and reactive to light.  Neck: Normal range of motion. Neck supple.  Cardiovascular: Normal rate and regular rhythm.    Murmur heard. Abdominal: Soft. Bowel sounds are normal.  Musculoskeletal: He exhibits edema and tenderness.          Assessment & Plan:  Excellent new control with the insulin Continued educations today  I have spent more than 30 minutes examining this patient face-to-face of which over half was spent in counseling Adjust SSI

## 2012-11-23 NOTE — Patient Instructions (Signed)
We are going to change the insulin to 15 units twice a day with your sliding Scale instructions being that the blood sugars stay below 110 you about back to 10 units twice a day

## 2012-11-24 ENCOUNTER — Other Ambulatory Visit: Payer: Self-pay | Admitting: Internal Medicine

## 2012-12-03 ENCOUNTER — Telehealth: Payer: Self-pay | Admitting: Internal Medicine

## 2012-12-03 DIAGNOSIS — E1169 Type 2 diabetes mellitus with other specified complication: Secondary | ICD-10-CM

## 2012-12-03 DIAGNOSIS — E1165 Type 2 diabetes mellitus with hyperglycemia: Secondary | ICD-10-CM

## 2012-12-03 MED ORDER — INSULIN LISPRO PROT & LISPRO (75-25 MIX) 100 UNIT/ML ~~LOC~~ SUSP: SUBCUTANEOUS | Status: DC

## 2012-12-03 NOTE — Telephone Encounter (Signed)
k

## 2012-12-03 NOTE — Telephone Encounter (Signed)
Per dr Lovell Sheehan- increase sliding scale by 5 units

## 2012-12-03 NOTE — Telephone Encounter (Signed)
Computer issues, triage note not copied.  Patient reports increase in blood sugar to > 350 after giving self 20 units SS insulin for blood sugar of 213, states ate light breakfast, blood sugars over 200 for ~ 1 week, denies any other symptoms of concern, afebrile, taking medications as directed. PLease call with recommendations.

## 2012-12-04 ENCOUNTER — Other Ambulatory Visit: Payer: Self-pay | Admitting: *Deleted

## 2013-02-15 ENCOUNTER — Ambulatory Visit (INDEPENDENT_AMBULATORY_CARE_PROVIDER_SITE_OTHER): Payer: Medicare HMO | Admitting: Internal Medicine

## 2013-02-15 ENCOUNTER — Encounter: Payer: Self-pay | Admitting: Internal Medicine

## 2013-02-15 VITALS — BP 160/82 | HR 80 | Temp 98.3°F | Resp 16 | Ht 70.0 in | Wt 216.0 lb

## 2013-02-15 DIAGNOSIS — R0989 Other specified symptoms and signs involving the circulatory and respiratory systems: Secondary | ICD-10-CM

## 2013-02-15 DIAGNOSIS — I10 Essential (primary) hypertension: Secondary | ICD-10-CM

## 2013-02-15 DIAGNOSIS — R0600 Dyspnea, unspecified: Secondary | ICD-10-CM

## 2013-02-15 DIAGNOSIS — R0609 Other forms of dyspnea: Secondary | ICD-10-CM

## 2013-02-15 DIAGNOSIS — I2510 Atherosclerotic heart disease of native coronary artery without angina pectoris: Secondary | ICD-10-CM

## 2013-02-15 DIAGNOSIS — E119 Type 2 diabetes mellitus without complications: Secondary | ICD-10-CM

## 2013-02-15 DIAGNOSIS — I509 Heart failure, unspecified: Secondary | ICD-10-CM

## 2013-02-15 DIAGNOSIS — K5900 Constipation, unspecified: Secondary | ICD-10-CM

## 2013-02-15 LAB — BRAIN NATRIURETIC PEPTIDE: Pro B Natriuretic peptide (BNP): 35 pg/mL (ref 0.0–100.0)

## 2013-02-15 LAB — BASIC METABOLIC PANEL
BUN: 21 mg/dL (ref 6–23)
CO2: 28 mEq/L (ref 19–32)
Calcium: 8.9 mg/dL (ref 8.4–10.5)
Chloride: 104 mEq/L (ref 96–112)
Creatinine, Ser: 1.2 mg/dL (ref 0.4–1.5)
GFR: 73.81 mL/min (ref >60.00)
Glucose, Bld: 176 mg/dL — ABNORMAL HIGH (ref 70–99)
Potassium: 4.5 mEq/L (ref 3.5–5.1)
Sodium: 137 mEq/L (ref 135–145)

## 2013-02-15 LAB — HEMOGLOBIN A1C: Hgb A1c MFr Bld: 8.9 % — ABNORMAL HIGH (ref 4.6–6.5)

## 2013-02-15 MED ORDER — FUROSEMIDE 40 MG PO TABS: 40.0000 mg | ORAL_TABLET | Freq: Every morning | ORAL | Status: DC

## 2013-02-15 MED ORDER — BENAZEPRIL HCL 40 MG PO TABS: 40.0000 mg | ORAL_TABLET | Freq: Every day | ORAL | Status: DC

## 2013-02-15 NOTE — Patient Instructions (Signed)
Stop your current blood pressure medication. Replace it with benazepril 40 and Lasix 40 of each first thing in the morning The Lasix should make you immediately lose the fluid in your legs. If it makes you lightheaded then cut the Lasix in half. You will get a call from my office to schedule an echocardiogram next week

## 2013-02-15 NOTE — Progress Notes (Signed)
Subjective:    Patient ID: [name], male    DOB: 1921-02-04, 79 y.o.   MRN: [id number]  HPI  Weight gain  And elevated blood pressure noted Has not been sob but has 3 plus ankle edema discussion of CHF Was a patient of Dr Juanda Chance   Review of Systems  Constitutional: Negative for fever and fatigue.  HENT: Negative for hearing loss, congestion, neck pain and postnasal drip.   Eyes: Negative for discharge, redness and visual disturbance.  Respiratory: Negative for cough, shortness of breath and wheezing.   Cardiovascular: Negative for leg swelling.  Gastrointestinal: Negative for abdominal pain, constipation and abdominal distention.  Genitourinary: Negative for urgency and frequency.  Musculoskeletal: Negative for joint swelling and arthralgias.  Skin: Negative for color change and rash.  Neurological: Negative for weakness and light-headedness.  Hematological: Negative for adenopathy.  Psychiatric/Behavioral: Negative for behavioral problems.   Past Medical History  Diagnosis Date  . Hyperlipidemia   . Hypertension   . Elevated PSA   . CAD (coronary artery disease)   . Diabetes mellitus   . GERD (gastroesophageal reflux disease)   . Hemorrhoids   . Hx of adenomatous colonic polyps   . Arthritis   . CAD (coronary artery disease)     post diaphragjatic wall infarction and subsequent coronary artery bypass graft surgery in 2003, now stable  . Prostate cancer     History   Social History  . Marital Status: Divorced    Spouse Name: N/A    Number of Children: N/A  . Years of Education: N/A   Occupational History  . Retired [employer]   Social History Main Topics  . Smoking status: Former Smoker    Quit date: 10/25/1948  . Smokeless tobacco: Never Used  . Alcohol Use: No  . Drug Use: No  . Sexually Active: Not Currently   Other Topics Concern  . Not on file   Social History Narrative   4 children    Past Surgical History  Procedure  Laterality Date  . Coronary artery bypass graft      Family History  Problem Relation Age of Onset  . Heart disease Mother   . Diabetes Mother     No Known Allergies  Current Outpatient Prescriptions on File Prior to Visit  Medication Sig Dispense Refill  . aspirin (ECOTRIN) 325 MG EC tablet Take 325 mg by mouth daily.        . benazepril-hydrochlorthiazide (LOTENSIN HCT) 20-12.5 MG per tablet TAKE 1 TABLET BY MOUTH 2 (TWO) TIMES DAILY.  180 tablet  PRN  . ciprofloxacin (CIPRO) 250 MG tablet Take 1 tablet (250 mg total) by mouth 2 (two) times daily.  28 tablet  0  . finasteride (PROSCAR) 5 MG tablet Take 5 mg by mouth daily.      . insulin lispro protamine-insulin lispro (HUMALOG MIX 75/25 KWIKPEN) (75-25) 100 UNIT/ML SUSP If cbg >250 take 20 units if over 300 take 25 units  12 mL  12  . Insulin Syringe-Needle U-100 (B-D INS SYRINGE 0.5CC/31GX5/16) 31G X 5/16" 0.5 ML MISC 1 each by Does not apply route 2 (two) times daily.  100 each  11  . lovastatin (MEVACOR) 20 MG tablet TAKE 1 TABLET (20 MG TOTAL) BY MOUTH AT BEDTIME.  90 tablet  PRN  . nitrofurantoin, macrocrystal-monohydrate, (MACROBID) 100 MG capsule Take 1 capsule (100 mg total) by mouth 2 (two) times daily.  28 capsule  0  . Tamsulosin HCl (FLOMAX) 0.4  MG CAPS Take 1 capsule (0.4 mg total) by mouth daily.  90 capsule  3  . vitamin B-12 (CYANOCOBALAMIN) 100 MCG tablet Take 50 mcg by mouth daily.        . vitamin C (ASCORBIC ACID) 500 MG tablet Take 500 mg by mouth daily.         No current facility-administered medications on file prior to visit.    BP 160/82  Pulse 80  Temp(Src) 98.3 F (36.8 C)  Resp 16  Ht 5\' 10"  (1.778 m)  Wt 216 lb (97.977 kg)  BMI 30.99 kg/m2       Objective:   Physical Exam  Nursing note and vitals reviewed. Constitutional: He appears well-developed and well-nourished.  HENT:  Head: Normocephalic and atraumatic.  Eyes: Conjunctivae are normal. Pupils are equal, round, and reactive to  light.  Neck: Normal range of motion. Neck supple.  Cardiovascular: Normal rate and regular rhythm.   Murmur heard. 3+ edema of lower extremities  Pulmonary/Chest: Effort normal and breath sounds normal.  Abdominal: Soft. Bowel sounds are normal.  Musculoskeletal: He exhibits edema.          Assessment & Plan:  worsing lower extremity edema in setting of CAD, HTN Echo for EF and wall motion ( dystollic dysfunction) Change the HTN meds to benazepril 30 Add lasix 40 monitor BMet and BNP

## 2013-02-26 ENCOUNTER — Ambulatory Visit (HOSPITAL_COMMUNITY): Payer: Medicare HMO | Attending: Cardiovascular Disease

## 2013-02-26 DIAGNOSIS — I2510 Atherosclerotic heart disease of native coronary artery without angina pectoris: Secondary | ICD-10-CM | POA: Insufficient documentation

## 2013-02-26 DIAGNOSIS — R0600 Dyspnea, unspecified: Secondary | ICD-10-CM

## 2013-02-26 DIAGNOSIS — Z87891 Personal history of nicotine dependence: Secondary | ICD-10-CM | POA: Insufficient documentation

## 2013-02-26 DIAGNOSIS — I10 Essential (primary) hypertension: Secondary | ICD-10-CM | POA: Insufficient documentation

## 2013-02-26 DIAGNOSIS — R635 Abnormal weight gain: Secondary | ICD-10-CM | POA: Insufficient documentation

## 2013-02-26 DIAGNOSIS — R0609 Other forms of dyspnea: Secondary | ICD-10-CM | POA: Insufficient documentation

## 2013-02-26 DIAGNOSIS — I509 Heart failure, unspecified: Secondary | ICD-10-CM | POA: Insufficient documentation

## 2013-02-26 DIAGNOSIS — E119 Type 2 diabetes mellitus without complications: Secondary | ICD-10-CM | POA: Insufficient documentation

## 2013-02-26 DIAGNOSIS — R609 Edema, unspecified: Secondary | ICD-10-CM | POA: Insufficient documentation

## 2013-02-26 DIAGNOSIS — R0989 Other specified symptoms and signs involving the circulatory and respiratory systems: Secondary | ICD-10-CM | POA: Insufficient documentation

## 2013-02-26 DIAGNOSIS — E785 Hyperlipidemia, unspecified: Secondary | ICD-10-CM | POA: Insufficient documentation

## 2013-02-26 NOTE — Progress Notes (Signed)
Echocardiogram performed.  

## 2013-03-19 ENCOUNTER — Telehealth: Payer: Self-pay | Admitting: Internal Medicine

## 2013-03-19 NOTE — Telephone Encounter (Signed)
Patient Information:  Caller Name: Woodruff  Phone: [phone]  Patient: [name], [name]  Gender: Male  DOB: Apr 24, 1921  Age: 79 Years  PCP: Darryll Capers (Adults only)  Office Follow Up:  Does the office need to follow up with this patient?: Yes  Instructions For The Office: See in Office today d/t BLE swelling.  Thank you.   Symptoms  Reason For Call & Symptoms: Swelling in feet and legs.  Caller states the swelling seem worse than a month ago and with taking Lasix as prescribed.  No shortness of breath. Pt continues to go the gym to work out.  NO pain or discoloration.   Reviewed Health History In EMR: Yes  Reviewed Medications In EMR: Yes  Reviewed Allergies In EMR: Yes  Reviewed Surgeries / Procedures: Yes  Date of Onset of Symptoms: 01/22/2013  Treatments Tried: RX for 30 days and no reduction in swelling  Treatments Tried Worked: No  Guideline(s) Used:  Leg Swelling and Edema  Disposition Per Guideline:   See Today in Office  Reason For Disposition Reached:   Moderate swelling of both ankles (e.g., swelling extends up to the knees) AND new onset or worsening  Advice Given:  Call Back If:  Swelling becomes worse  Swelling becomes red or painful to the touch  You become worse.  Patient Will Follow Care Advice:  YES

## 2013-03-21 NOTE — Telephone Encounter (Signed)
Talked with pt and legs are not swollen in am , they swell as day goes on- wants to see someone- ov with apadonda tomorrow at 3:45

## 2013-03-21 NOTE — Telephone Encounter (Signed)
Pt following up on call yesterday.  Pls call pt [phone]

## 2013-03-22 ENCOUNTER — Ambulatory Visit (INDEPENDENT_AMBULATORY_CARE_PROVIDER_SITE_OTHER): Payer: Medicare HMO | Admitting: Family

## 2013-03-22 ENCOUNTER — Encounter: Payer: Self-pay | Admitting: Family

## 2013-03-22 VITALS — BP 144/76 | HR 71 | Wt 218.0 lb

## 2013-03-22 DIAGNOSIS — R609 Edema, unspecified: Secondary | ICD-10-CM

## 2013-03-22 DIAGNOSIS — I2510 Atherosclerotic heart disease of native coronary artery without angina pectoris: Secondary | ICD-10-CM

## 2013-03-22 DIAGNOSIS — I10 Essential (primary) hypertension: Secondary | ICD-10-CM

## 2013-03-22 MED ORDER — FUROSEMIDE 40 MG PO TABS: 40.0000 mg | ORAL_TABLET | Freq: Two times a day (BID) | ORAL | Status: DC

## 2013-03-22 NOTE — Patient Instructions (Signed)

## 2013-03-25 ENCOUNTER — Encounter: Payer: Self-pay | Admitting: Family

## 2013-03-25 NOTE — Progress Notes (Signed)
Subjective:    Patient ID: [name], male    DOB: 1920/12/16, 79 y.o.   MRN: [id number]  HPI 79 year old African American male, nonsmoker, patient of [name] is in today with persistent lower extremity edema since April. He took a month supply of Lasix 40 mg once a day. He does not feel that the edema improved. He was sent to cardiology was found to have a normal BNP. Ejection fraction 65%. Normal kidney function. He denies any chest pain, shortness of breath, lightheadedness or dizziness. Medications.   Review of Systems  Constitutional: Negative.   HENT: Negative.   Respiratory: Negative.   Cardiovascular: Positive for leg swelling.  Gastrointestinal: Negative.   Endocrine: Negative.   Musculoskeletal: Negative.   Skin: Negative.   Allergic/Immunologic: Negative.   Neurological: Negative.   Hematological: Negative.   Psychiatric/Behavioral: Negative.    Past Medical History  Diagnosis Date  . Hyperlipidemia   . Hypertension   . Elevated PSA   . CAD (coronary artery disease)   . Diabetes mellitus   . GERD (gastroesophageal reflux disease)   . Hemorrhoids   . Hx of adenomatous colonic polyps   . Arthritis   . CAD (coronary artery disease)     post diaphragjatic wall infarction and subsequent coronary artery bypass graft surgery in 2003, now stable  . Prostate cancer     History   Social History  . Marital Status: Divorced    Spouse Name: N/A    Number of Children: N/A  . Years of Education: N/A   Occupational History  . Retired [employer]   Social History Main Topics  . Smoking status: Former Smoker    Quit date: 10/25/1948  . Smokeless tobacco: Never Used  . Alcohol Use: No  . Drug Use: No  . Sexually Active: Not Currently   Other Topics Concern  . Not on file   Social History Narrative   4 children    Past Surgical History  Procedure Laterality Date  . Coronary artery bypass graft      Family History  Problem Relation  Age of Onset  . Heart disease Mother   . Diabetes Mother     No Known Allergies  Current Outpatient Prescriptions on File Prior to Visit  Medication Sig Dispense Refill  . aspirin (ECOTRIN) 325 MG EC tablet Take 325 mg by mouth daily.        . benazepril (LOTENSIN) 40 MG tablet Take 1 tablet (40 mg total) by mouth daily.  30 tablet  2  . finasteride (PROSCAR) 5 MG tablet Take 5 mg by mouth daily.      . insulin lispro protamine-insulin lispro (HUMALOG MIX 75/25 KWIKPEN) (75-25) 100 UNIT/ML SUSP If cbg >250 take 20 units if over 300 take 25 units  12 mL  12  . Insulin Syringe-Needle U-100 (B-D INS SYRINGE 0.5CC/31GX5/16) 31G X 5/16" 0.5 ML MISC 1 each by Does not apply route 2 (two) times daily.  100 each  11  . lovastatin (MEVACOR) 20 MG tablet TAKE 1 TABLET (20 MG TOTAL) BY MOUTH AT BEDTIME.  90 tablet  PRN  . nitrofurantoin, macrocrystal-monohydrate, (MACROBID) 100 MG capsule Take 1 capsule (100 mg total) by mouth 2 (two) times daily.  28 capsule  0  . vitamin B-12 (CYANOCOBALAMIN) 100 MCG tablet Take 50 mcg by mouth daily.        . vitamin C (ASCORBIC ACID) 500 MG tablet Take 500 mg by mouth daily.        Marland Kitchen  ciprofloxacin (CIPRO) 250 MG tablet Take 1 tablet (250 mg total) by mouth 2 (two) times daily.  28 tablet  0  . Tamsulosin HCl (FLOMAX) 0.4 MG CAPS Take 1 capsule (0.4 mg total) by mouth daily.  90 capsule  3   No current facility-administered medications on file prior to visit.    BP 144/76  Pulse 71  Wt 218 lb (98.884 kg)  BMI 31.28 kg/m2  SpO2 98%chart    Objective:   Physical Exam  Constitutional: He is oriented to person, place, and time. He appears well-developed and well-nourished.  HENT:  Right Ear: External ear normal.  Left Ear: External ear normal.  Nose: Nose normal.  Mouth/Throat: Oropharynx is clear and moist.  Neck: Normal range of motion. Neck supple. No thyromegaly present.  Cardiovascular: Normal rate, regular rhythm and normal heart sounds.    Pulmonary/Chest: Effort normal and breath sounds normal.  Abdominal: Soft. Bowel sounds are normal.  Musculoskeletal: Normal range of motion. He exhibits edema. He exhibits no tenderness.  Neurological: He is alert and oriented to person, place, and time.  Skin: Skin is warm and dry.  Psychiatric: He has a normal mood and affect.          Assessment & Plan:  Assessment:  1. Peripheral edema 2. Hypertension  Plan: Increase Lasix to 40 mg twice a day. Support hose bilaterally or an ace wrap to the lower extremities to help with perfusion. If symptoms persist, I will order a doppler study and possible vascular consult.

## 2013-04-05 ENCOUNTER — Encounter: Payer: Self-pay | Admitting: Internal Medicine

## 2013-04-05 ENCOUNTER — Ambulatory Visit (INDEPENDENT_AMBULATORY_CARE_PROVIDER_SITE_OTHER): Payer: Medicare HMO | Admitting: Internal Medicine

## 2013-04-05 VITALS — BP 130/70 | HR 80 | Temp 98.2°F | Resp 16 | Ht 70.0 in | Wt 212.0 lb

## 2013-04-05 DIAGNOSIS — R609 Edema, unspecified: Secondary | ICD-10-CM

## 2013-04-05 DIAGNOSIS — E1165 Type 2 diabetes mellitus with hyperglycemia: Secondary | ICD-10-CM

## 2013-04-05 DIAGNOSIS — I10 Essential (primary) hypertension: Secondary | ICD-10-CM

## 2013-04-05 DIAGNOSIS — T887XXA Unspecified adverse effect of drug or medicament, initial encounter: Secondary | ICD-10-CM

## 2013-04-05 DIAGNOSIS — R600 Localized edema: Secondary | ICD-10-CM

## 2013-04-05 DIAGNOSIS — E1169 Type 2 diabetes mellitus with other specified complication: Secondary | ICD-10-CM

## 2013-04-05 LAB — BASIC METABOLIC PANEL
BUN: 27 mg/dL — ABNORMAL HIGH (ref 6–23)
CO2: 25 mEq/L (ref 19–32)
Calcium: 9.1 mg/dL (ref 8.4–10.5)
Chloride: 104 mEq/L (ref 96–112)
Creatinine, Ser: 1.4 mg/dL (ref 0.4–1.5)
GFR: 60.18 mL/min (ref >60.00)
Glucose, Bld: 229 mg/dL — ABNORMAL HIGH (ref 70–99)
Potassium: 4.5 mEq/L (ref 3.5–5.1)
Sodium: 140 mEq/L (ref 135–145)

## 2013-04-05 LAB — HEMOGLOBIN A1C: Hgb A1c MFr Bld: 9.5 % — ABNORMAL HIGH (ref 4.6–6.5)

## 2013-04-05 NOTE — Progress Notes (Signed)
Subjective:    Patient ID: [name], male    DOB: 07-Dec-1920, 79 y.o.   MRN: [id number]  HPI  Patient has see the office for volume overload and renal insufficiency   Increased furosemide No leg cramping CBGs are stable Using wrats for increased edema  Review of Systems  Constitutional: Negative for fever and fatigue.  HENT: Negative for hearing loss, congestion, neck pain and postnasal drip.   Eyes: Negative for discharge, redness and visual disturbance.  Respiratory: Negative for cough, shortness of breath and wheezing.   Cardiovascular: Negative for leg swelling.  Gastrointestinal: Negative for abdominal pain, constipation and abdominal distention.  Genitourinary: Negative for urgency and frequency.  Musculoskeletal: Negative for joint swelling and arthralgias.  Skin: Negative for color change and rash.  Neurological: Negative for weakness and light-headedness.  Hematological: Negative for adenopathy.  Psychiatric/Behavioral: Negative for behavioral problems.   Past Medical History  Diagnosis Date  . Hyperlipidemia   . Hypertension   . Elevated PSA   . CAD (coronary artery disease)   . Diabetes mellitus   . GERD (gastroesophageal reflux disease)   . Hemorrhoids   . Hx of adenomatous colonic polyps   . Arthritis   . CAD (coronary artery disease)     post diaphragjatic wall infarction and subsequent coronary artery bypass graft surgery in 2003, now stable  . Prostate cancer     History   Social History  . Marital Status: Divorced    Spouse Name: N/A    Number of Children: N/A  . Years of Education: N/A   Occupational History  . Retired [employer]   Social History Main Topics  . Smoking status: Former Smoker    Quit date: 10/25/1948  . Smokeless tobacco: Never Used  . Alcohol Use: No  . Drug Use: No  . Sexually Active: Not Currently   Other Topics Concern  . Not on file   Social History Narrative   4 children    Past Surgical  History  Procedure Laterality Date  . Coronary artery bypass graft      Family History  Problem Relation Age of Onset  . Heart disease Mother   . Diabetes Mother     No Known Allergies  Current Outpatient Prescriptions on File Prior to Visit  Medication Sig Dispense Refill  . aspirin (ECOTRIN) 325 MG EC tablet Take 325 mg by mouth daily.        . benazepril (LOTENSIN) 40 MG tablet Take 1 tablet (40 mg total) by mouth daily.  30 tablet  2  . ciprofloxacin (CIPRO) 250 MG tablet Take 1 tablet (250 mg total) by mouth 2 (two) times daily.  28 tablet  0  . finasteride (PROSCAR) 5 MG tablet Take 5 mg by mouth daily.      . furosemide (LASIX) 40 MG tablet Take 1 tablet (40 mg total) by mouth 2 (two) times daily.  60 tablet  3  . insulin lispro protamine-insulin lispro (HUMALOG MIX 75/25 KWIKPEN) (75-25) 100 UNIT/ML SUSP If cbg >250 take 20 units if over 300 take 25 units  12 mL  12  . Insulin Syringe-Needle U-100 (B-D INS SYRINGE 0.5CC/31GX5/16) 31G X 5/16" 0.5 ML MISC 1 each by Does not apply route 2 (two) times daily.  100 each  11  . lovastatin (MEVACOR) 20 MG tablet TAKE 1 TABLET (20 MG TOTAL) BY MOUTH AT BEDTIME.  90 tablet  PRN  . nitrofurantoin, macrocrystal-monohydrate, (MACROBID) 100 MG capsule Take 1 capsule (  100 mg total) by mouth 2 (two) times daily.  28 capsule  0  . Tamsulosin HCl (FLOMAX) 0.4 MG CAPS Take 1 capsule (0.4 mg total) by mouth daily.  90 capsule  3  . vitamin B-12 (CYANOCOBALAMIN) 100 MCG tablet Take 50 mcg by mouth daily.        . vitamin C (ASCORBIC ACID) 500 MG tablet Take 500 mg by mouth daily.         No current facility-administered medications on file prior to visit.    There were no vitals taken for this visit.       Objective:   Physical Exam  Nursing note and vitals reviewed. Constitutional: He is oriented to person, place, and time. He appears well-developed and well-nourished.  HENT:  Head: Normocephalic and atraumatic.  Eyes: Conjunctivae are  normal. Pupils are equal, round, and reactive to light.  Neck: Normal range of motion. Neck supple.  Cardiovascular: Normal rate and regular rhythm.  Exam reveals gallop.   Pulmonary/Chest: Effort normal and breath sounds normal.  Abdominal: Soft. Bowel sounds are normal.  Musculoskeletal:  varicosities  Neurological: He is alert and oriented to person, place, and time.  Skin: Skin is warm and dry.          Assessment & Plan:  Labs ordered for increased furosemides DM stable but A1c to be ordered and Bmet Agree with increased lasix and will remain on this dose

## 2013-04-10 ENCOUNTER — Telehealth: Payer: Self-pay | Admitting: *Deleted

## 2013-04-10 NOTE — Telephone Encounter (Signed)
Pt informed appt made

## 2013-04-12 ENCOUNTER — Ambulatory Visit (INDEPENDENT_AMBULATORY_CARE_PROVIDER_SITE_OTHER): Payer: Medicare HMO | Admitting: Family

## 2013-04-12 ENCOUNTER — Encounter: Payer: Self-pay | Admitting: Family

## 2013-04-12 VITALS — BP 130/74 | HR 88 | Wt 215.0 lb

## 2013-04-12 DIAGNOSIS — IMO0001 Reserved for inherently not codable concepts without codable children: Secondary | ICD-10-CM

## 2013-04-12 DIAGNOSIS — E119 Type 2 diabetes mellitus without complications: Secondary | ICD-10-CM

## 2013-04-12 DIAGNOSIS — K219 Gastro-esophageal reflux disease without esophagitis: Secondary | ICD-10-CM

## 2013-04-12 LAB — GLUCOSE, POCT (MANUAL RESULT ENTRY): POC Glucose: 189 mg/dl — AB (ref 70–99)

## 2013-04-12 MED ORDER — OMEPRAZOLE 40 MG PO CPDR: 40.0000 mg | DELAYED_RELEASE_CAPSULE | Freq: Every day | ORAL | Status: DC

## 2013-04-12 NOTE — Patient Instructions (Addendum)
1. Increase Humalog to 18 units in the am 2. Increase Humalog to 22 units in the evening. 3. Call me in 3 weeks and provide blood glucose reading.  We may adjust. 4. At some point we may Lantus and a short acting insulin.   Diabetes Meal Planning Guide The diabetes meal planning guide is a tool to help you plan your meals and snacks. It is important for people with diabetes to manage their blood glucose (sugar) levels. Choosing the right foods and the right amounts throughout your day will help control your blood glucose. Eating right can even help you improve your blood pressure and reach or maintain a healthy weight. CARBOHYDRATE COUNTING MADE EASY When you eat carbohydrates, they turn to sugar. This raises your blood glucose level. Counting carbohydrates can help you control this level so you feel better. When you plan your meals by counting carbohydrates, you can have more flexibility in what you eat and balance your medicine with your food intake. Carbohydrate counting simply means adding up the total amount of carbohydrate grams in your meals and snacks. Try to eat about the same amount at each meal. Foods with carbohydrates are listed below. Each portion below is 1 carbohydrate serving or 15 grams of carbohydrates. Ask your dietician how many grams of carbohydrates you should eat at each meal or snack. Grains and Starches  1 slice bread.   English muffin or hotdog/hamburger bun.   cup cold cereal (unsweetened).   cup cooked pasta or rice.   cup starchy vegetables (corn, potatoes, peas, beans, winter squash).  1 tortilla (6 inches).   bagel.  1 waffle or pancake (size of a CD).   cup cooked cereal.  4 to 6 small crackers. *Whole grain is recommended. Fruit  1 cup fresh unsweetened berries, melon, papaya, pineapple.  1 small fresh fruit.   banana or mango.   cup fruit juice (4 oz unsweetened).   cup canned fruit in natural juice or water.  2 tbs dried  fruit.  12 to 15 grapes or cherries. Milk and Yogurt  1 cup fat-free or 1% milk.  1 cup soy milk.  6 oz light yogurt with sugar-free sweetener.  6 oz low-fat soy yogurt.  6 oz plain yogurt. Vegetables  1 cup raw or  cup cooked is counted as 0 carbohydrates or a "free" food.  If you eat 3 or more servings at 1 meal, count them as 1 carbohydrate serving. Other Carbohydrates   oz chips or pretzels.   cup ice cream or frozen yogurt.   cup sherbet or sorbet.  2 inch square cake, no frosting.  1 tbs honey, sugar, jam, jelly, or syrup.  2 small cookies.  3 squares of graham crackers.  3 cups popcorn.  6 crackers.  1 cup broth-based soup.  Count 1 cup casserole or other mixed foods as 2 carbohydrate servings.  Foods with less than 20 calories in a serving may be counted as 0 carbohydrates or a "free" food. You may want to purchase a book or computer software that lists the carbohydrate gram counts of different foods. In addition, the nutrition facts panel on the labels of the foods you eat are a good source of this information. The label will tell you how big the serving size is and the total number of carbohydrate grams you will be eating per serving. Divide this number by 15 to obtain the number of carbohydrate servings in a portion. Remember, 1 carbohydrate serving equals 15 grams  of carbohydrate. SERVING SIZES Measuring foods and serving sizes helps you make sure you are getting the right amount of food. The list below tells how big or small some common serving sizes are.  1 oz.........4 stacked dice.  3 oz........Marland KitchenDeck of cards.  1 tsp.......Marland KitchenTip of little finger.  1 tbs......Marland KitchenMarland KitchenThumb.  2 tbs.......Marland KitchenGolf ball.   cup......Marland KitchenHalf of a fist.  1 cup.......Marland KitchenA fist. SAMPLE DIABETES MEAL PLAN Below is a sample meal plan that includes foods from the grain and starches, dairy, vegetable, fruit, and meat groups. A dietician can individualize a meal plan to fit your  calorie needs and tell you the number of servings needed from each food group. However, controlling the total amount of carbohydrates in your meal or snack is more important than making sure you include all of the food groups at every meal. You may interchange carbohydrate containing foods (dairy, starches, and fruits). The meal plan below is an example of a 2000 calorie diet using carbohydrate counting. This meal plan has 17 carbohydrate servings. Breakfast  1 cup oatmeal (2 carb servings).   cup light yogurt (1 carb serving).  1 cup blueberries (1 carb serving).   cup almonds. Snack  1 large apple (2 carb servings).  1 low-fat string cheese stick. Lunch  Chicken breast salad.  1 cup spinach.   cup chopped tomatoes.  2 oz chicken breast, sliced.  2 tbs low-fat [place] dressing.  12 whole-wheat crackers (2 carb servings).  12 to 15 grapes (1 carb serving).  1 cup low-fat milk (1 carb serving). Snack  1 cup carrots.   cup hummus (1 carb serving). Dinner  3 oz broiled salmon.  1 cup brown rice (3 carb servings). Snack  1  cups steamed broccoli (1 carb serving) drizzled with 1 tsp olive oil and lemon juice.  1 cup light pudding (2 carb servings). DIABETES MEAL PLANNING WORKSHEET Your dietician can use this worksheet to help you decide how many servings of foods and what types of foods are right for you.  BREAKFAST Food Group and Servings / Carb Servings Grain/Starches __________________________________ Dairy __________________________________________ Vegetable ______________________________________ Fruit ___________________________________________ Meat __________________________________________ Fat ____________________________________________ LUNCH Food Group and Servings / Carb Servings Grain/Starches ___________________________________ Dairy ___________________________________________ Fruit ____________________________________________ Meat  ___________________________________________ Fat _____________________________________________ Laural Golden Food Group and Servings / Carb Servings Grain/Starches ___________________________________ Dairy ___________________________________________ Fruit ____________________________________________ Meat ___________________________________________ Fat _____________________________________________ SNACKS Food Group and Servings / Carb Servings Grain/Starches ___________________________________ Dairy ___________________________________________ Vegetable _______________________________________ Fruit ____________________________________________ Meat ___________________________________________ Fat _____________________________________________ DAILY TOTALS Starches _________________________ Vegetable ________________________ Fruit ____________________________ Dairy ____________________________ Meat ____________________________ Fat ______________________________ Document Released: 07/07/2005 Document Revised: 01/02/2012 Document Reviewed: 05/18/2009 [employer] Patient Information 2014 [employer]

## 2013-04-12 NOTE — Progress Notes (Signed)
Subjective:    Patient ID: [name], male    DOB: 11-Jun-1921, 79 y.o.   MRN: [id number]  HPI 79 year old [place] American male, nonsmoker, patient of [name] is in today with uncontrolled type 2 diabetes. He's currently on Humalog 75/25. He takes 15 units in the morning and 20 units in the evening. His last hemoglobin A1c was 9.8. Reports his blood sugar being between 167-270. Denies any increase in urination or thirst.   Review of Systems  Constitutional: Negative.   HENT: Negative.   Respiratory: Negative.   Cardiovascular: Negative.   Endocrine: Negative.  Negative for polydipsia, polyphagia and polyuria.  Genitourinary: Negative.   Musculoskeletal: Negative.   Skin: Negative.   Allergic/Immunologic: Negative.   Neurological: Negative.   Hematological: Negative.   Psychiatric/Behavioral: Negative.    Past Medical History  Diagnosis Date  . Hyperlipidemia   . Hypertension   . Elevated PSA   . CAD (coronary artery disease)   . Diabetes mellitus   . GERD (gastroesophageal reflux disease)   . Hemorrhoids   . Hx of adenomatous colonic polyps   . Arthritis   . CAD (coronary artery disease)     post diaphragjatic wall infarction and subsequent coronary artery bypass graft surgery in 2003, now stable  . Prostate cancer     History   Social History  . Marital Status: Divorced    Spouse Name: N/A    Number of Children: N/A  . Years of Education: N/A   Occupational History  . Retired [employer]   Social History Main Topics  . Smoking status: Former Smoker    Quit date: 10/25/1948  . Smokeless tobacco: Never Used  . Alcohol Use: No  . Drug Use: No  . Sexually Active: Not Currently   Other Topics Concern  . Not on file   Social History Narrative   4 children    Past Surgical History  Procedure Laterality Date  . Coronary artery bypass graft      Family History  Problem Relation Age of Onset  . Heart disease Mother   . Diabetes  Mother     No Known Allergies  Current Outpatient Prescriptions on File Prior to Visit  Medication Sig Dispense Refill  . aspirin (ECOTRIN) 325 MG EC tablet Take 325 mg by mouth daily.        . benazepril (LOTENSIN) 40 MG tablet Take 1 tablet (40 mg total) by mouth daily.  30 tablet  2  . ciprofloxacin (CIPRO) 250 MG tablet Take 1 tablet (250 mg total) by mouth 2 (two) times daily.  28 tablet  0  . finasteride (PROSCAR) 5 MG tablet Take 5 mg by mouth daily.      . furosemide (LASIX) 40 MG tablet Take 1 tablet (40 mg total) by mouth 2 (two) times daily.  60 tablet  3  . insulin lispro protamine-insulin lispro (HUMALOG MIX 75/25 KWIKPEN) (75-25) 100 UNIT/ML SUSP If cbg >250 take 20 units if over 300 take 25 units  12 mL  12  . Insulin Syringe-Needle U-100 (B-D INS SYRINGE 0.5CC/31GX5/16) 31G X 5/16" 0.5 ML MISC 1 each by Does not apply route 2 (two) times daily.  100 each  11  . lovastatin (MEVACOR) 20 MG tablet TAKE 1 TABLET (20 MG TOTAL) BY MOUTH AT BEDTIME.  90 tablet  PRN  . nitrofurantoin, macrocrystal-monohydrate, (MACROBID) 100 MG capsule Take 1 capsule (100 mg total) by mouth 2 (two) times daily.  28 capsule  0  .  Tamsulosin HCl (FLOMAX) 0.4 MG CAPS Take 1 capsule (0.4 mg total) by mouth daily.  90 capsule  3  . vitamin B-12 (CYANOCOBALAMIN) 100 MCG tablet Take 50 mcg by mouth daily.        . vitamin C (ASCORBIC ACID) 500 MG tablet Take 500 mg by mouth daily.         No current facility-administered medications on file prior to visit.    BP 130/74  Pulse 88  Wt 215 lb (97.523 kg)  BMI 30.85 kg/m2  SpO2 96%chart    Objective:   Physical Exam  Constitutional: He is oriented to person, place, and time. He appears well-developed.  HENT:  Right Ear: External ear normal.  Left Ear: External ear normal.  Nose: Nose normal.  Mouth/Throat: Oropharynx is clear and moist.  Eyes: Conjunctivae are normal. Pupils are equal, round, and reactive to light.  Neck: Normal range of motion.  Neck supple. No thyromegaly present.  Cardiovascular: Normal rate, regular rhythm and normal heart sounds.   Pulmonary/Chest: Effort normal and breath sounds normal.  Abdominal: Soft. Bowel sounds are normal.  Musculoskeletal: Normal range of motion.  Neurological: He is alert and oriented to person, place, and time. He has normal reflexes.  Skin: Skin is warm.  Psychiatric: He has a normal mood and affect.          Assessment & Plan:  Assessment: 1. Type 2 diabetes uncontrolled 2. Hypertension 3. Hyperlipidemia  Plan: I discussed possible treatment options. His A1c down to goal. However since he has an abundance of Humalog, will increase his Humalog to 18 units in the morning and 22 units in the evening. He will call me in 2 weeks with blood sugars and we'll consider increasing more at that point. Encouraged healthy diet, low carb and sugars. Walk daily. We'll followup by phone in 2 weeks and see [name] next month.

## 2013-05-20 ENCOUNTER — Ambulatory Visit: Payer: Medicare HMO | Admitting: Internal Medicine

## 2013-05-24 ENCOUNTER — Other Ambulatory Visit: Payer: Self-pay | Admitting: Internal Medicine

## 2013-06-20 ENCOUNTER — Telehealth: Payer: Self-pay | Admitting: Internal Medicine

## 2013-06-20 NOTE — Telephone Encounter (Signed)
Patient Information:  Caller Name: Loranzo  Phone: [phone]  Patient: Shuayb, Schepers  Gender: Male  DOB: 02-28-1921  Age: 79 Years  PCP: Darryll Capers (Adults only)  Office Follow Up:  Does the office need to follow up with this patient?: Yes  Instructions For The Office: PLS READ RN NOTE  RN Note:  Pt has had increased Blood Sugar for 1 week.  Pt had 18 units Insulin after breakfast, 7am, finger Stick was 325.  Pt recheck at 12pm, FSBS 225 w/o eating.  Pt denies any other symptoms.  Pt would like to know if MD suggest additional insulin.  Pt has appt in November. Discussed increasing water intake until he hears back from office. PLEASE REVIEW W/ MD AND F/U W/ PT IF APPT NEEDS TO BE SCHEDULED OR IF MD WOULD LIKE PT TAKE INSULIN COVERAGE OR INCREASE UNITS BEFORE MEALS.  Symptoms  Reason For Call & Symptoms: Elevated Blood Pressure  Reviewed Health History In EMR: Yes  Reviewed Medications In EMR: Yes  Reviewed Allergies In EMR: Yes  Reviewed Surgeries / Procedures: Yes  Date of Onset of Symptoms: 06/13/2013  Treatments Tried: Insulin 18 units  Treatments Tried Worked: No  Guideline(s) Used:  Diabetes - High Blood Sugar  Disposition Per Guideline:   Discuss with PCP and Callback by Nurse Today  Reason For Disposition Reached:   Caller has NON-URGENT medication question about med that PCP prescribed and triager unable to answer question  Advice Given:  N/A  Patient Will Follow Care Advice:  YES

## 2013-06-20 NOTE — Telephone Encounter (Signed)
Spoke with patient.  PCP is out of the office next week.  A follow up appointment made with Dr Caryl Never.

## 2013-06-20 NOTE — Telephone Encounter (Signed)
I recommend follow up with primary to discuss and have him bring in log of sugar readings then.

## 2013-06-27 ENCOUNTER — Ambulatory Visit (INDEPENDENT_AMBULATORY_CARE_PROVIDER_SITE_OTHER): Payer: Medicare HMO | Admitting: Family Medicine

## 2013-06-27 ENCOUNTER — Encounter: Payer: Self-pay | Admitting: Family Medicine

## 2013-06-27 VITALS — BP 126/68 | HR 67 | Temp 98.1°F | Wt 225.0 lb

## 2013-06-27 DIAGNOSIS — E119 Type 2 diabetes mellitus without complications: Secondary | ICD-10-CM

## 2013-06-27 NOTE — Progress Notes (Signed)
  Subjective:    Patient ID: [name], male    DOB: [birth year], 79 y.o.   MRN: [id number]  HPI Patient has long standing history of type 2 diabetes. He estimates 20 years Currently takes regimen of Humalog 75/25 18 units in the morning and 22 units at night Recent poor control of diabetes with most recent A1c 9.5%. His insulin was titrated up to dosage above back in June. Even with this titration, he has had consistent fasting blood sugars around 140-150 inconsistently pre-supper blood sugars of 160s to 170s. No recent hypoglycemic symptoms. He is compliant with therapy.  Generally eats 3 meals per day and tries to spread out his calories. Denies any symptoms of polyuria or polydipsia.  Past Medical History  Diagnosis Date  . Hyperlipidemia   . Hypertension   . Elevated PSA   . CAD (coronary artery disease)   . Diabetes mellitus   . GERD (gastroesophageal reflux disease)   . Hemorrhoids   . Hx of adenomatous colonic polyps   . Arthritis   . CAD (coronary artery disease)     post diaphragjatic wall infarction and subsequent coronary artery bypass graft surgery in 2003, now stable  . Prostate cancer    Past Surgical History  Procedure Laterality Date  . Coronary artery bypass graft      reports that he quit smoking about 64 years ago. He has never used smokeless tobacco. He reports that he does not drink alcohol or use illicit drugs. family history includes Diabetes in his mother; Heart disease in his mother. No Known Allergies    Review of Systems  Constitutional: Negative for fever and chills.  Respiratory: Negative for cough and shortness of breath.   Endocrine: Negative for polydipsia and polyuria.  Genitourinary: Negative for dysuria.       Objective:   Physical Exam  Constitutional: He appears well-developed and well-nourished.  Cardiovascular: Normal rate and regular rhythm.   Pulmonary/Chest: Effort normal and breath sounds normal. No respiratory  distress. He has no wheezes. He has no rales.  Musculoskeletal: He exhibits no edema.          Assessment & Plan:  Type 2 diabetes. Poorly controlled. Change to Humalog 75/25 mix to 24 units each morning with breakfast and 20 units at night with dinner. We're increasing his morning insulin to address that he is consistently running higher at dinner We'll order repeat hemoglobin A1c for end of this month

## 2013-06-27 NOTE — Patient Instructions (Addendum)
Change Humalog 75/25 to 24 units each morning with breakfast and 20 units each evening with supper.

## 2013-07-26 ENCOUNTER — Other Ambulatory Visit: Payer: Self-pay | Admitting: Internal Medicine

## 2013-07-26 ENCOUNTER — Other Ambulatory Visit: Payer: Self-pay | Admitting: Family

## 2013-08-26 ENCOUNTER — Ambulatory Visit: Payer: Medicare HMO | Admitting: Internal Medicine

## 2013-09-02 ENCOUNTER — Other Ambulatory Visit: Payer: Self-pay | Admitting: Internal Medicine

## 2013-10-03 ENCOUNTER — Telehealth: Payer: Self-pay | Admitting: Internal Medicine

## 2013-10-03 NOTE — Telephone Encounter (Signed)
Patient Information:  Caller Name: Leandrew  Phone: [phone]  Patient: [name], [name]  Gender: Male  DOB: March 27, 1921  Age: 79 Years  PCP: Darryll Capers (Adults only)  Office Follow Up:  Does the office need to follow up with this patient?: No  Instructions For The Office: N/A   Symptoms  Reason For Call & Symptoms: Pt states he has fever and not feeling well.  Reviewed Health History In EMR: Yes  Reviewed Medications In EMR: Yes  Reviewed Allergies In EMR: Yes  Reviewed Surgeries / Procedures: Yes  Date of Onset of Symptoms: 10/03/2013  Any Fever: Yes  Fever Taken: Oral  Fever Time Of Reading: 11:00:00  Fever Last Reading: 100.4  Guideline(s) Used:  Fever  Disposition Per Guideline:   Home Care  Reason For Disposition Reached:   Fever without localizing symptoms  Advice Given:  Reassurance.   100-102F (37.8 - 38.9C): Low-grade fevers and may help body fight infection.  For All Fevers:  Drink cold fluids orally to prevent dehydration (Reason: good hydration replaces sweat and improves heat loss via skin). Adults should drink 6-8 glasses of water daily.  Dress in one layer of lightweight clothing and sleep with one light blanket.  For fevers 100-101 F (37.8-38.3 C) this is the only treatment and fever medicine is unnecessary.  Fever Medicines:  Acetaminophen (e.g., Tylenol):  Regular Strength Tylenol: Take 650 mg (two 325 mg pills) by mouth every 4-6 hours as needed. Each Regular Strength Tylenol pill has 325 mg of acetaminophen.  Expected Course:  Most fevers from a viral illness such as a cold fluctuate between 99.5 and 103 F (37.5 - 39.5 C) and last for 2 or 3 days.  Contagiousness:   You can return to work or school after the fever is gone.  Call Back If:  Fever lasts longer than 3 days (72 hours)  You become worse  Patient Will Follow Care Advice:  YES

## 2013-10-03 NOTE — Telephone Encounter (Signed)
Patient called. Attempted to call patient back in order to triage. Unable to reach patient, message left to contact office.

## 2013-10-03 NOTE — Telephone Encounter (Signed)
Noted  

## 2013-10-10 ENCOUNTER — Telehealth: Payer: Self-pay | Admitting: Internal Medicine

## 2013-10-10 NOTE — Telephone Encounter (Signed)
Patient Information:  Caller Name: Gamaliel  Phone: [phone]  Patient: [name], [name]  Gender: Male  DOB: 10/24/1921  Age: 79 Years  PCP: Darryll Capers (Adults only)  Office Follow Up:  Does the office need to follow up with this patient?: No  Instructions For The Office: N/A  RN Note:  Patient c/o one week history of weakness and generalized fatigue.  Afebrile.  No cough.  Blood sugars are normal range for him; AM blood sugar 106.  BP has been 136-60.  States fatigue makes him too tired to perform usual activities.  Denies emergent symptoms per weakness/fatigue protocol; advised appt in office.  Appt scheduled first available in Epic 10/11/13 1345 with Dr. Selena Batten.  krs/can  Symptoms  Reason For Call & Symptoms: weakness, no appetite  Reviewed Health History In EMR: Yes  Reviewed Medications In EMR: Yes  Reviewed Allergies In EMR: Yes  Reviewed Surgeries / Procedures: Yes  Date of Onset of Symptoms: 10/03/2013  Guideline(s) Used:  Weakness (Generalized) and Fatigue  Disposition Per Guideline:   See Today in Office  Reason For Disposition Reached:   Moderate weakness (i.e., interferes with work, school, normal activities) and persists > 3 days  Advice Given:  N/A  Patient Will Follow Care Advice:  YES  Appointment Scheduled:  10/11/2013 13:45:00 Appointment Scheduled Provider:  Selena Batten (TEXT 1st, after 20 mins can call), Dahlia Client [facility])

## 2013-10-11 ENCOUNTER — Ambulatory Visit (INDEPENDENT_AMBULATORY_CARE_PROVIDER_SITE_OTHER): Payer: Medicare HMO | Admitting: Family Medicine

## 2013-10-11 ENCOUNTER — Encounter: Payer: Self-pay | Admitting: Family Medicine

## 2013-10-11 VITALS — BP 140/60 | HR 62 | Temp 99.3°F | Wt 223.0 lb

## 2013-10-11 DIAGNOSIS — R5381 Other malaise: Secondary | ICD-10-CM

## 2013-10-11 DIAGNOSIS — B349 Viral infection, unspecified: Secondary | ICD-10-CM

## 2013-10-11 DIAGNOSIS — B9789 Other viral agents as the cause of diseases classified elsewhere: Secondary | ICD-10-CM

## 2013-10-11 DIAGNOSIS — R630 Anorexia: Secondary | ICD-10-CM

## 2013-10-11 NOTE — Progress Notes (Signed)
. Chief Complaint  Patient presents with  . Fatigue    HPI:  79 yo M patient of [name] here for acute visit for:  Fatigue: -pt reports he thought he had the flu - had fevers and felt sick last week for 4 days- this has resolved -now with some lingering fatigue and poor appetite since being sick last week, but now improved today abd he ate a large bbq meal for lunch and is feeling beter -denies: CP, SOB, cough, sinus pain or pressure, ear pain, joint pain changed, sore throat, NVD, changes in urinary symptoms (he does cath urines - followed by urology)  ROS: See pertinent positives and negatives per HPI.  Past Medical History  Diagnosis Date  . Hyperlipidemia   . Hypertension   . Elevated PSA   . CAD (coronary artery disease)   . Diabetes mellitus   . GERD (gastroesophageal reflux disease)   . Hemorrhoids   . Hx of adenomatous colonic polyps   . Arthritis   . CAD (coronary artery disease)     post diaphragjatic wall infarction and subsequent coronary artery bypass graft surgery in 2003, now stable  . Prostate cancer     Past Surgical History  Procedure Laterality Date  . Coronary artery bypass graft      Family History  Problem Relation Age of Onset  . Heart disease Mother   . Diabetes Mother     History   Social History  . Marital Status: Divorced    Spouse Name: N/A    Number of Children: N/A  . Years of Education: N/A   Occupational History  . Retired [employer]   Social History Main Topics  . Smoking status: Former Smoker    Quit date: 10/25/1948  . Smokeless tobacco: Never Used  . Alcohol Use: No  . Drug Use: No  . Sexual Activity: Not Currently   Other Topics Concern  . None   Social History Narrative   4 children    Current outpatient prescriptions:ACCU-CHEK AVIVA PLUS test strip, USE  1  STRIP TWICE DAILY, Disp: 200 each, Rfl: 3;  aspirin (ECOTRIN) 325 MG EC tablet, Take 325 mg by mouth daily.  , Disp: , Rfl: ;  benazepril  (LOTENSIN) 40 MG tablet, TAKE 1 TABLET (40 MG TOTAL) BY MOUTH DAILY., Disp: 30 tablet, Rfl: 11;  finasteride (PROSCAR) 5 MG tablet, Take 5 mg by mouth daily., Disp: , Rfl:  furosemide (LASIX) 40 MG tablet, TAKE 1 TABLET (40 MG TOTAL) BY MOUTH 2 (TWO) TIMES DAILY., Disp: 60 tablet, Rfl: 2;  insulin lispro protamine-lispro (HUMALOG 75/25) (75-25) 100 UNIT/ML SUSP injection, 2 (two) times daily with a meal. Take 24 units each morning with breakfast and 20 units each evening with supper., Disp: , Rfl:  Insulin Syringe-Needle U-100 (B-D INS SYRINGE 0.5CC/31GX5/16) 31G X 5/16" 0.5 ML MISC, 1 each by Does not apply route 2 (two) times daily., Disp: 100 each, Rfl: 11;  lovastatin (MEVACOR) 20 MG tablet, TAKE 1 TABLET (20 MG TOTAL) BY MOUTH AT BEDTIME., Disp: 90 tablet, Rfl: PRN;  nitrofurantoin, macrocrystal-monohydrate, (MACROBID) 100 MG capsule, Take 1 capsule (100 mg total) by mouth 2 (two) times daily., Disp: 28 capsule, Rfl: 0 omeprazole (PRILOSEC) 40 MG capsule, Take 1 capsule (40 mg total) by mouth daily., Disp: 30 capsule, Rfl: 3;  Tamsulosin HCl (FLOMAX) 0.4 MG CAPS, Take 1 capsule (0.4 mg total) by mouth daily., Disp: 90 capsule, Rfl: 3;  vitamin B-12 (CYANOCOBALAMIN) 100 MCG tablet, Take  50 mcg by mouth daily.  , Disp: , Rfl: ;  vitamin C (ASCORBIC ACID) 500 MG tablet, Take 500 mg by mouth daily.  , Disp: , Rfl:  ciprofloxacin (CIPRO) 250 MG tablet, Take 1 tablet (250 mg total) by mouth 2 (two) times daily., Disp: 28 tablet, Rfl: 0  EXAM:  Filed Vitals:   10/11/13 1317  BP: 140/60  Pulse: 62  Temp: 99.3 F (37.4 C)    Body mass index is 32 kg/(m^2).  GENERAL: vitals reviewed and listed above, alert, oriented, appears well hydrated and in no acute distress  HEENT: atraumatic, conjunttiva clear, no obvious abnormalities on inspection of external nose and ears  NECK: no obvious masses on inspection  LUNGS: clear to auscultation bilaterally, no wheezes, rales or rhonchi, good air  movement  CV: HRRR, no peripheral edema  MS: moves all extremities without noticeable abnormality  PSYCH: pleasant and cooperative, no obvious depression or anxiety  ASSESSMENT AND PLAN:  Discussed the following assessment and plan:  Viral syndrome  Poor appetite  Other malaise and fatigue  -seems he likely had a viral illness or the flu starting 8 days ago and now is improving sig -I would have liked to do a urine as he describes few focal symptoms for cause of fever last week, but he reported he can not do this here and needs to do it with urologist whom he has an upcoming appt with -I advised if starts to feel bad again or fevers again needs to have eval immediately  -Patient advised to return or notify a doctor immediately if symptoms worsen or persist or new concerns arise.  There are no Patient Instructions on file for this visit.   Kriste Basque R.

## 2013-10-11 NOTE — Progress Notes (Signed)
Pre visit review using our clinic review tool, if applicable. No additional management support is needed unless otherwise documented below in the visit note. 

## 2013-11-22 ENCOUNTER — Telehealth: Payer: Self-pay | Admitting: Internal Medicine

## 2013-11-22 DIAGNOSIS — M25552 Pain in left hip: Secondary | ICD-10-CM

## 2013-11-22 NOTE — Telephone Encounter (Signed)
Please place referral for L hip pain to [facility], already sent referral to [facility].

## 2013-11-22 NOTE — Telephone Encounter (Signed)
Referral order placed.

## 2013-11-25 ENCOUNTER — Other Ambulatory Visit: Payer: Self-pay | Admitting: Internal Medicine

## 2013-12-13 ENCOUNTER — Encounter: Payer: Self-pay | Admitting: Internal Medicine

## 2013-12-13 ENCOUNTER — Ambulatory Visit (INDEPENDENT_AMBULATORY_CARE_PROVIDER_SITE_OTHER): Payer: Medicare HMO | Admitting: Internal Medicine

## 2013-12-13 VITALS — BP 146/84 | HR 84 | Resp 18 | Wt 226.0 lb

## 2013-12-13 DIAGNOSIS — I10 Essential (primary) hypertension: Secondary | ICD-10-CM

## 2013-12-13 DIAGNOSIS — I509 Heart failure, unspecified: Secondary | ICD-10-CM

## 2013-12-13 DIAGNOSIS — E1165 Type 2 diabetes mellitus with hyperglycemia: Secondary | ICD-10-CM

## 2013-12-13 DIAGNOSIS — E1169 Type 2 diabetes mellitus with other specified complication: Secondary | ICD-10-CM

## 2013-12-13 DIAGNOSIS — IMO0002 Reserved for concepts with insufficient information to code with codable children: Secondary | ICD-10-CM

## 2013-12-13 DIAGNOSIS — R609 Edema, unspecified: Secondary | ICD-10-CM

## 2013-12-13 LAB — COMPREHENSIVE METABOLIC PANEL
ALT: 17 U/L (ref 0–53)
AST: 19 U/L (ref 0–37)
Albumin: 4 g/dL (ref 3.5–5.2)
Alkaline Phosphatase: 55 U/L (ref 39–117)
BUN: 25 mg/dL — ABNORMAL HIGH (ref 6–23)
CO2: 26 mEq/L (ref 19–32)
Calcium: 9.4 mg/dL (ref 8.4–10.5)
Chloride: 106 mEq/L (ref 96–112)
Creat: 1.24 mg/dL (ref 0.50–1.35)
Glucose, Bld: 133 mg/dL — ABNORMAL HIGH (ref 70–99)
Potassium: 4.7 mEq/L (ref 3.5–5.3)
Sodium: 139 mEq/L (ref 135–145)
Total Bilirubin: 0.2 mg/dL (ref 0.2–1.2)
Total Protein: 7.4 g/dL (ref 6.0–8.3)

## 2013-12-13 MED ORDER — FUROSEMIDE 40 MG PO TABS: 40.0000 mg | ORAL_TABLET | Freq: Every morning | ORAL | Status: DC

## 2013-12-13 NOTE — Patient Instructions (Signed)
The patient is instructed to continue all medications as prescribed. Schedule followup with check out clerk upon leaving the clinic  

## 2013-12-13 NOTE — Progress Notes (Signed)
Pre visit review using our clinic review tool, if applicable. No additional management support is needed unless otherwise documented below in the visit note. 

## 2013-12-13 NOTE — Progress Notes (Signed)
Subjective:    Patient ID: [name], male    DOB: 09/18/1921, 80 y.o.   MRN: [id number]  Diabetes Associated symptoms include fatigue and polydipsia. Pertinent negatives for diabetes include no chest pain.  Hypertension Associated symptoms include shortness of breath. Pertinent negatives include no chest pain.  Hyperlipidemia Associated symptoms include shortness of breath. Pertinent negatives include no chest pain.    80 year old male who has class II congestive heart failure adult-onset diabetes with peripheral neuropathy marked peripheral edema and hypertension.  He has been out of his Lasix 40 mg that he is on part of his control of his heart failure his hypertension and edema he did not get it refilled because the container had 0 refills.  We discussed the need to contact the office if his medication cannot be refilled and to remain on medications listed in his [facility]  He states his blood sugars have been good he has mild shortness of breath and marked edema of his feet     Review of Systems  Constitutional: Positive for activity change and fatigue.  HENT: Positive for postnasal drip and rhinorrhea.   Eyes: Negative.   Respiratory: Positive for shortness of breath. Negative for cough, choking and chest tightness.   Cardiovascular: Positive for leg swelling. Negative for chest pain.  Gastrointestinal: Positive for abdominal distention. Negative for abdominal pain.  Endocrine: Positive for polydipsia.  Genitourinary: Positive for urgency and frequency.  Allergic/Immunologic: Negative.   Neurological: Negative.   Hematological: Negative.   Psychiatric/Behavioral: Negative.    Past Medical History  Diagnosis Date  . Hyperlipidemia   . Hypertension   . Elevated PSA   . CAD (coronary artery disease)   . Diabetes mellitus   . GERD (gastroesophageal reflux disease)   . Hemorrhoids   . Hx of adenomatous colonic polyps   . Arthritis   . CAD (coronary artery disease)      post diaphragjatic wall infarction and subsequent coronary artery bypass graft surgery in 2003, now stable  . Prostate cancer     History   Social History  . Marital Status: Divorced    Spouse Name: N/A    Number of Children: N/A  . Years of Education: N/A   Occupational History  . Retired [employer]   Social History Main Topics  . Smoking status: Former Smoker    Quit date: 10/25/1948  . Smokeless tobacco: Never Used  . Alcohol Use: No  . Drug Use: No  . Sexual Activity: Not Currently   Other Topics Concern  . Not on file   Social History Narrative   4 children    Past Surgical History  Procedure Laterality Date  . Coronary artery bypass graft      Family History  Problem Relation Age of Onset  . Heart disease Mother   . Diabetes Mother     No Known Allergies  Current Outpatient Prescriptions on File Prior to Visit  Medication Sig Dispense Refill  . ACCU-CHEK AVIVA PLUS test strip USE  1  STRIP TWICE DAILY  200 each  3  . aspirin (ECOTRIN) 325 MG EC tablet Take 325 mg by mouth daily.        . B-D UF III MINI PEN NEEDLES 31G X 5 MM MISC USE TWICE DAILY  100 each  10  . benazepril (LOTENSIN) 40 MG tablet TAKE 1 TABLET (40 MG TOTAL) BY MOUTH DAILY.  30 tablet  11  . insulin lispro protamine-lispro (HUMALOG 75/25) (75-25) 100  UNIT/ML SUSP injection 2 (two) times daily with a meal. Take 24 units each morning with breakfast and 20 units each evening with supper.      . lovastatin (MEVACOR) 20 MG tablet TAKE 1 TABLET (20 MG TOTAL) BY MOUTH AT BEDTIME.  90 tablet  PRN  . omeprazole (PRILOSEC) 40 MG capsule Take 1 capsule (40 mg total) by mouth daily.  30 capsule  3  . Tamsulosin HCl (FLOMAX) 0.4 MG CAPS Take 1 capsule (0.4 mg total) by mouth daily.  90 capsule  3  . vitamin B-12 (CYANOCOBALAMIN) 100 MCG tablet Take 50 mcg by mouth daily.        . vitamin C (ASCORBIC ACID) 500 MG tablet Take 500 mg by mouth daily.        . [DISCONTINUED] Insulin  Syringe-Needle U-100 (B-D INS SYRINGE 0.5CC/31GX5/16) 31G X 5/16" 0.5 ML MISC 1 each by Does not apply route 2 (two) times daily.  100 each  11   No current facility-administered medications on file prior to visit.    There were no vitals taken for this visit.       Objective:   Physical Exam  Constitutional: He appears well-developed and well-nourished.  HENT:  Head: Normocephalic and atraumatic.  Eyes: Conjunctivae are normal. Pupils are equal, round, and reactive to light.  Neck: Normal range of motion. Neck supple.  Cardiovascular: Normal rate and regular rhythm.   Murmur heard. 2+ pitting edema to the midcalf  Pulmonary/Chest: Effort normal and breath sounds normal.  Abdominal: Soft. Bowel sounds are normal.          Assessment & Plan:  Patient has increased congestive heart failure with increased fluid volume secondary to self discontinuation of the Lasix resume Lasix at 40 mg by mouth daily monitor a basic metabolic panel today to assess need for potassium supplementation.  Patient's diabetes are reported to be in fair control diabetic foot examination shows no foot lesions he does have pitting edema and a high risk for diabetic ulceration if we do not control his edema.  We will measure a hemoglobin A1c in addition to the metabolic panel.

## 2013-12-14 LAB — HEMOGLOBIN A1C
Hgb A1c MFr Bld: 8.4 % — ABNORMAL HIGH (ref <5.7)
Mean Plasma Glucose: 194 mg/dL — ABNORMAL HIGH (ref <117)

## 2013-12-16 ENCOUNTER — Telehealth: Payer: Self-pay | Admitting: Internal Medicine

## 2013-12-16 NOTE — Telephone Encounter (Signed)
Relevant patient education mailed to patient.  

## 2014-01-01 ENCOUNTER — Other Ambulatory Visit: Payer: Self-pay | Admitting: Internal Medicine

## 2014-01-09 ENCOUNTER — Other Ambulatory Visit: Payer: Self-pay | Admitting: Internal Medicine

## 2014-02-21 ENCOUNTER — Telehealth: Payer: Self-pay | Admitting: Internal Medicine

## 2014-02-21 DIAGNOSIS — H539 Unspecified visual disturbance: Secondary | ICD-10-CM

## 2014-02-21 NOTE — Telephone Encounter (Signed)
Ok per Dr Jenkins, referral order placed 

## 2014-02-21 NOTE — Telephone Encounter (Signed)
Pt req a referral to see ophthalmology Dr Melanie Crazier. [place]

## 2014-03-24 ENCOUNTER — Other Ambulatory Visit: Payer: Self-pay | Admitting: Internal Medicine

## 2014-03-29 ENCOUNTER — Emergency Department (HOSPITAL_COMMUNITY)
Admission: EM | Admit: 2014-03-29 | Discharge: 2014-03-29 | Disposition: A | Discharge status: Home / Self Care | Payer: Medicare HMO | Attending: Emergency Medicine | Admitting: Emergency Medicine

## 2014-03-29 ENCOUNTER — Encounter (HOSPITAL_COMMUNITY): Payer: Self-pay | Admitting: Emergency Medicine

## 2014-03-29 DIAGNOSIS — I2510 Atherosclerotic heart disease of native coronary artery without angina pectoris: Secondary | ICD-10-CM | POA: Insufficient documentation

## 2014-03-29 DIAGNOSIS — M129 Arthropathy, unspecified: Secondary | ICD-10-CM | POA: Insufficient documentation

## 2014-03-29 DIAGNOSIS — Z860100 Personal history of colon polyps, unspecified: Secondary | ICD-10-CM | POA: Insufficient documentation

## 2014-03-29 DIAGNOSIS — Z7982 Long term (current) use of aspirin: Secondary | ICD-10-CM | POA: Insufficient documentation

## 2014-03-29 DIAGNOSIS — Z794 Long term (current) use of insulin: Secondary | ICD-10-CM | POA: Insufficient documentation

## 2014-03-29 DIAGNOSIS — Z87891 Personal history of nicotine dependence: Secondary | ICD-10-CM | POA: Insufficient documentation

## 2014-03-29 DIAGNOSIS — Z951 Presence of aortocoronary bypass graft: Secondary | ICD-10-CM | POA: Insufficient documentation

## 2014-03-29 DIAGNOSIS — Z79899 Other long term (current) drug therapy: Secondary | ICD-10-CM | POA: Insufficient documentation

## 2014-03-29 DIAGNOSIS — I10 Essential (primary) hypertension: Secondary | ICD-10-CM | POA: Insufficient documentation

## 2014-03-29 DIAGNOSIS — E785 Hyperlipidemia, unspecified: Secondary | ICD-10-CM | POA: Insufficient documentation

## 2014-03-29 DIAGNOSIS — E119 Type 2 diabetes mellitus without complications: Secondary | ICD-10-CM | POA: Insufficient documentation

## 2014-03-29 DIAGNOSIS — R339 Retention of urine, unspecified: Secondary | ICD-10-CM

## 2014-03-29 DIAGNOSIS — Z8719 Personal history of other diseases of the digestive system: Secondary | ICD-10-CM | POA: Insufficient documentation

## 2014-03-29 DIAGNOSIS — Z8546 Personal history of malignant neoplasm of prostate: Secondary | ICD-10-CM | POA: Insufficient documentation

## 2014-03-29 DIAGNOSIS — R319 Hematuria, unspecified: Secondary | ICD-10-CM | POA: Insufficient documentation

## 2014-03-29 LAB — URINE MICROSCOPIC-ADD ON

## 2014-03-29 LAB — URINALYSIS, ROUTINE W REFLEX MICROSCOPIC
Bilirubin Urine: NEGATIVE
Glucose, UA: NEGATIVE mg/dL
Ketones, ur: 15 mg/dL — AB
Nitrite: NEGATIVE
Protein, ur: 30 mg/dL — AB
Specific Gravity, Urine: 1.015 (ref 1.005–1.030)
Urobilinogen, UA: 0.2 mg/dL (ref 0.0–1.0)
pH: 7 (ref 5.0–8.0)

## 2014-03-29 MED ORDER — CEPHALEXIN 500 MG PO CAPS: 500.0000 mg | ORAL_CAPSULE | Freq: Three times a day (TID) | ORAL | Status: DC

## 2014-03-29 MED ORDER — CEPHALEXIN 250 MG PO CAPS
500.0000 mg | ORAL_CAPSULE | Freq: Once | ORAL | Status: AC
  Administered 2014-03-29: 500 mg via ORAL
  Filled 2014-03-29: qty 2

## 2014-03-29 NOTE — ED Provider Notes (Signed)
CSN: [id number]     Arrival date & time 03/29/14  0804 History   First MD Initiated Contact with Patient 03/29/14 0813     Chief Complaint  Patient presents with  . Urinary Retention     (Consider location/radiation/quality/duration/timing/severity/associated sxs/prior Treatment) The history is provided by the patient.  pt w hx prostate ca, self caths tid, states since yesterday he is having trouble getting urine to come out of cath. States he is passing cath per normal, but that urine wont come out.  States yesterday morning had to try several times, and then a large amount urine came out.  Today tried, but no urine would pass, pt feels as if bladder full. No back or flank pain. No fever or chills. No dysuria. States occasional notes small amount blood when caths, no recent change. States otherwise feels well, is eating and drinking per normal routine. Normal appetite.       Past Medical History  Diagnosis Date  . Hyperlipidemia   . Hypertension   . Elevated PSA   . CAD (coronary artery disease)   . Diabetes mellitus   . GERD (gastroesophageal reflux disease)   . Hemorrhoids   . Hx of adenomatous colonic polyps   . Arthritis   . CAD (coronary artery disease)     post diaphragjatic wall infarction and subsequent coronary artery bypass graft surgery in 2003, now stable  . Prostate cancer    Past Surgical History  Procedure Laterality Date  . Coronary artery bypass graft     Family History  Problem Relation Age of Onset  . Heart disease Mother   . Diabetes Mother    History  Substance Use Topics  . Smoking status: Former Smoker    Quit date: 10/25/1948  . Smokeless tobacco: Never Used  . Alcohol Use: No    Review of Systems  Constitutional: Negative for fever and chills.  HENT: Negative for sore throat.   Eyes: Negative for redness.  Respiratory: Negative for shortness of breath.   Cardiovascular: Negative for chest pain.  Gastrointestinal: Negative for vomiting,  abdominal pain and diarrhea.  Genitourinary: Negative for dysuria, hematuria and flank pain.  Musculoskeletal: Negative for back pain and neck pain.  Skin: Negative for rash.  Neurological: Negative for headaches.  Hematological: Does not bruise/bleed easily.  Psychiatric/Behavioral: Negative for confusion.      Allergies  Review of patient's allergies indicates no known allergies.  Home Medications   Prior to Admission medications   Medication Sig Start Date End Date Taking? Authorizing Provider  ACCU-CHEK SOFTCLIX LANCETS lancets USE  1  LANCET TWICE DAILY 03/24/14  Yes Ricard Dillon, MD  aspirin (ECOTRIN) 325 MG EC tablet Take 325 mg by mouth daily.     Yes Historical Provider, MD  B-D UF III MINI PEN NEEDLES 31G X 5 MM MISC USE TWICE DAILY 11/25/13  Yes Ricard Dillon, MD  benazepril-hydrochlorthiazide (LOTENSIN HCT) 20-12.5 MG per tablet Take 1 tablet by mouth 2 (two) times daily.   Yes Historical Provider, MD  furosemide (LASIX) 40 MG tablet Take 1 tablet (40 mg total) by mouth every morning. 12/13/13  Yes Ricard Dillon, MD  insulin lispro protamine-lispro (HUMALOG 75/25) (75-25) 100 UNIT/ML SUSP injection Take 24 units each morning with breakfast and 20 units each evening with supper. 12/03/12  Yes Ricard Dillon, MD  lovastatin (MEVACOR) 20 MG tablet Take 20 mg by mouth at bedtime.   Yes Historical Provider, MD  PRODIGY NO CODING BLOOD  GLUC test strip TEST TWO TIMES DAILY 01/01/14  Yes Ricard Dillon, MD  vitamin B-12 (CYANOCOBALAMIN) 100 MCG tablet Take 50 mcg by mouth daily.     Yes Historical Provider, MD  vitamin C (ASCORBIC ACID) 500 MG tablet Take 500 mg by mouth daily.     Yes Historical Provider, MD   There were no vitals taken for this visit. Physical Exam  Nursing note and vitals reviewed. Constitutional: He is oriented to person, place, and time. He appears well-developed and well-nourished. No distress.  HENT:  Head: Atraumatic.  Eyes: Conjunctivae are normal. No  scleral icterus.  Neck: Neck supple. No tracheal deviation present.  Cardiovascular: Normal rate, regular rhythm, normal heart sounds and intact distal pulses.   Pulmonary/Chest: Effort normal and breath sounds normal. No accessory muscle usage. No respiratory distress.  Abdominal: Soft. Bowel sounds are normal. He exhibits no distension and no mass. There is no tenderness. There is no rebound and no guarding.  Genitourinary:  No cva tenderness. Normal ext gen.   Musculoskeletal: Normal range of motion. He exhibits no edema and no tenderness.  Neurological: He is alert and oriented to person, place, and time.  Skin: Skin is warm and dry. He is not diaphoretic.  Psychiatric: He has a normal mood and affect.    ED Course  Procedures (including critical care time) Labs Review  Results for orders placed during the hospital encounter of 03/29/14  URINALYSIS, ROUTINE W REFLEX MICROSCOPIC      Result Value Ref Range   Color, Urine RED (*) YELLOW   APPearance CLOUDY (*) CLEAR   Specific Gravity, Urine 1.015  1.005 - 1.030   pH 7.0  5.0 - 8.0   Glucose, UA NEGATIVE  NEGATIVE mg/dL   Hgb urine dipstick LARGE (*) NEGATIVE   Bilirubin Urine NEGATIVE  NEGATIVE   Ketones, ur 15 (*) NEGATIVE mg/dL   Protein, ur 30 (*) NEGATIVE mg/dL   Urobilinogen, UA 0.2  0.0 - 1.0 mg/dL   Nitrite NEGATIVE  NEGATIVE   Leukocytes, UA MODERATE (*) NEGATIVE  URINE MICROSCOPIC-ADD ON      Result Value Ref Range   WBC, UA 3-6  <3 WBC/hpf   RBC / HPF TOO NUMEROUS TO COUNT  <3 RBC/hpf   Bacteria, UA RARE  RARE     MDM  Bladder scan, 500 cc in bladder.   Nursing unable to pass 16 Fr foley.  I placed 22 fr coude cath, using sterile technique.  Initial blood clots followed by bloody urine that clears significantly as drains.   Check post void residual. Will leave foley w leg bag.  Urology follow up this week.   ua sent.  Reviewed nursing notes and prior charts for additional history.     Mirna Mires, MD 03/29/14 [phone]

## 2014-03-29 NOTE — ED Notes (Signed)
Pt sent home with urinary catheter, leg bag attached. To follow up with urologist next week. Pt has no further questions at the time. Catheter care teaching performed.

## 2014-03-29 NOTE — ED Notes (Signed)
Pt presents to department for evaluation of urinary retention. States he has been unable to urinate since yesterday. Pt uses catheter to urinate at home. States lower abdominal pressure. No nausea/vomiting. Pt is alert and oriented x4.

## 2014-03-29 NOTE — ED Notes (Signed)
Dr. Ashok Cordia at bedside. 36fr coude catheter inserted. No urine output noted. Foley catheter flushed with 119ml of NS, no urine returned after catheter flushed.

## 2014-03-29 NOTE — Discharge Instructions (Signed)
Drink plenty of fluids. Empty leg bag as need. There is a balloon in your bladder, do not attempt to remove catheter without this balloon first being deflated, or injury to your bladder and/or urethra may occur. Your urine was sent to test for infection - the culture result will be back in a couple days - have your urologist follow up on the result then - in the mean time, take keflex (antibiotic) as prescribed.  Follow up with your urologist in the next few days - call their office Monday morning to arrange appointment. Return to [facility] ER (as this is the hospital that your/our urologist use primarily) if symptoms worse, catheter not draining, abdominal pain, fevers, other concern.     Foley Catheter Care, Adult A Foley catheter is a soft, flexible tube that is placed into the bladder to drain urine. A Foley catheter may be inserted if:  You leak urine or are not able to control when you urinate (urinary incontinence).  You are not able to urinate when you need to (urinary retention).  You had prostate surgery or surgery on the genitals.  You have certain medical conditions, such as multiple sclerosis, dementia, or a spinal cord injury. If you are going home with a Foley catheter in place, follow the instructions below. TAKING CARE OF THE CATHETER 1. Wash your hands with soap and water. 2. Using mild soap and warm water on a clean washcloth:  Clean the area on your body closest to the catheter insertion site using a circular motion, moving away from the catheter. Never wipe toward the catheter because this could sweep bacteria up into the urethra and cause infection.  Remove all traces of soap. Pat the area dry with a clean towel. For males, reposition the foreskin. 3. Attach the catheter to your leg so there is no tension on the catheter. Use adhesive tape or a leg strap. If you are using adhesive tape, remove any sticky residue left behind by the previous tape you used. 4. Keep  the drainage bag below the level of the bladder, but keep it off the floor. 5. Check throughout the day to be sure the catheter is working and urine is draining freely. Make sure the tubing does not become kinked. 6. Do not pull on the catheter or try to remove it. Pulling could damage internal tissues. TAKING CARE OF THE DRAINAGE BAGS You will be given two drainage bags to take home. One is a large overnight drainage bag, and the other is a smaller leg bag that fits underneath clothing. You may wear the overnight bag at any time, but you should never wear the smaller leg bag at night. Follow the instructions below for how to empty, change, and clean your drainage bags. Emptying the Drainage Bag You must empty your drainage bag when it is    full or at least 2 3 times a day. 1. Wash your hands with soap and water. 2. Keep the drainage bag below your hips, below the level of your bladder. This stops urine from going back into the tubing and into your bladder. 3. Hold the dirty bag over the toilet or a clean container. 4. Open the pour spout at the bottom of the bag and empty the urine into the toilet or container. Do not let the pour spout touch the toilet, container, or any other surface. Doing so can place bacteria on the bag, which can cause an infection. 5. Clean the pour spout with a  gauze pad or cotton ball that has rubbing alcohol on it. 6. Close the pour spout. 7. Attach the bag to your leg with adhesive tape or a leg strap. 8. Wash your hands well. Changing the Drainage Bag Change your drainage bag once a month or sooner if it starts to smell bad or look dirty. Below are steps to follow when changing the drainage bag. 1. Wash your hands with soap and water. 2. Pinch off the rubber catheter so that urine does not spill out. 3. Disconnect the catheter tube from the drainage tube at the connection valve. Do not let the tubes touch any surface. 4. Clean the end of the catheter tube with an  alcohol wipe. Use a different alcohol wipe to clean the end of the drainage tube. 5. Connect the catheter tube to the drainage tube of the clean drainage bag. 6. Attach the new bag to the leg with adhesive tape or a leg strap. Avoid attaching the new bag too tightly. 7. Wash your hands well. Cleaning the Drainage Bag 1. Wash your hands with soap and water. 2. Wash the bag in warm, soapy water. 3. Rinse the bag thoroughly with warm water. 4. Fill the bag with a solution of white vinegar and water (1 cup vinegar to 1 qt warm water [.2 L vinegar to 1 L warm water]). Close the bag and soak it for 30 minutes in the solution. 5. Rinse the bag with warm water. 6. Hang the bag to dry with the pour spout open and hanging downward. 7. Store the clean bag (once it is dry) in a clean plastic bag. 8. Wash your hands well. PREVENTING INFECTION  Wash your hands before and after handling your catheter.  Take showers daily and wash the area where the catheter enters your body. Do not take baths. Replace wet leg straps with dry ones, if this applies.  Do not use powders, sprays, or lotions on the genital area. Only use creams, lotions, or ointments as directed by your caregiver.  For females, wipe from front to back after each bowel movement.  Drink enough fluids to keep your urine clear or pale yellow unless you have a fluid restriction.  Do not let the drainage bag or tubing touch or lie on the floor.  Wear cotton underwear to absorb moisture and to keep your skin drier. SEEK MEDICAL CARE IF:   Your urine is cloudy or smells unusually bad.  Your catheter becomes clogged.  You are not draining urine into the bag or your bladder feels full.  Your catheter starts to leak. SEEK IMMEDIATE MEDICAL CARE IF:   You have pain, swelling, redness, or pus where the catheter enters the body.  You have pain in the abdomen, legs, lower back, or bladder.  You have a fever.  You see blood fill the  catheter, or your urine is pink or red.  You have nausea, vomiting, or chills.  Your catheter gets pulled out. MAKE SURE YOU:   Understand these instructions.  Will watch your condition.  Will get help right away if you are not doing well or get worse. Document Released: 10/10/2005 Document Revised: 02/04/2013 Document Reviewed: 10/01/2012 [facility] Patient Information 2014 [place].    Acute Urinary Retention, Male Acute urinary retention is the temporary inability to urinate. This is a common problem in older men. As men age their prostates become larger and block the flow of urine from the bladder. This is usually a problem that has come  on gradually.  HOME CARE INSTRUCTIONS If you are sent home with a Foley catheter and a drainage system, you will need to discuss the best course of action with your health care provider. While the catheter is in, maintain a good intake of fluids. Keep the drainage bag emptied and lower than your catheter. This is so that contaminated urine will not flow back into your bladder, which could lead to a urinary tract infection. There are two main types of drainage bags. One is a large bag that usually is used at night. It has a good capacity that will allow you to sleep through the night without having to empty it. The second type is called a leg bag. It has a smaller capacity, so it needs to be emptied more frequently. However, the main advantage is that it can be attached by a leg strap and can go underneath your clothing, allowing you the freedom to move about or leave your home. Only take over-the-counter or prescription medicines for pain, discomfort, or fever as directed by your health care provider.  SEEK MEDICAL CARE IF:  You develop a low-grade fever.  You experience spasms or leakage of urine with the spasms. SEEK IMMEDIATE MEDICAL CARE IF:   You develop chills or fever.  Your catheter stops draining urine.  Your catheter falls  out.  You start to develop increased bleeding that does not respond to rest and increased fluid intake. MAKE SURE YOU:  Understand these instructions.  Will watch your condition.  Will get help right away if you are not doing well or get worse. Document Released: 01/16/2001 Document Revised: 06/12/2013 Document Reviewed: 03/21/2013 [facility] Patient Information 2014 [place], [place].    Hematuria, Adult Hematuria is blood in your urine. It can be caused by a bladder infection, kidney infection, prostate infection, kidney stone, or cancer of your urinary tract. Infections can usually be treated with medicine, and a kidney stone usually will pass through your urine. If neither of these is the cause of your hematuria, further workup to find out the reason may be needed. It is very important that you tell your health care provider about any blood you see in your urine, even if the blood stops without treatment or happens without causing pain. Blood in your urine that happens and then stops and then happens again can be a symptom of a very serious condition. Also, pain is not a symptom in the initial stages of many urinary cancers. HOME CARE INSTRUCTIONS   Drink lots of fluid, 3 4 quarts a day. If you have been diagnosed with an infection, cranberry juice is especially recommended, in addition to large amounts of water.  Avoid caffeine, tea, and carbonated beverages, because they tend to irritate the bladder.  Avoid alcohol because it may irritate the prostate.  Only take over-the-counter or prescription medicines for pain, discomfort, or fever as directed by your health care provider.  If you have been diagnosed with a kidney stone, follow your health care provider's instructions regarding straining your urine to catch the stone.  Empty your bladder often. Avoid holding urine for long periods of time.  After a bowel movement, women should cleanse front to back. Use each tissue only  once.  Empty your bladder before and after sexual intercourse if you are a male. SEEK MEDICAL CARE IF: You develop back pain, fever, a feeling of sickness in your stomach (nausea), or vomiting or if your symptoms are not better in 3 days. Return sooner if  you are getting worse. SEEK IMMEDIATE MEDICAL CARE IF:   You have a persistent fever, with a temperature of 101.4F (38.8C) or greater.  You develop severe vomiting and are unable to keep the medicine down.  You develop severe back or abdominal pain despite taking your medicines.  You begin passing a large amount of blood or clots in your urine.  You feel extremely weak or faint, or you pass out. MAKE SURE YOU:   Understand these instructions.  Will watch your condition.  Will get help right away if you are not doing well or get worse. Document Released: 10/10/2005 Document Revised: 07/31/2013 Document Reviewed: 06/10/2013 [facility] Patient Information 2014 [place].

## 2014-03-29 NOTE — ED Notes (Signed)
Dr. Ashok Cordia at bedside. Blood clots flushed out of catheter. Urine returned at the time. Will continue to monitor.

## 2014-03-29 NOTE — ED Notes (Signed)
Pt states urinary retention since yesterday. States he uses catheter at home. States lower abdominal pressure.

## 2014-04-01 LAB — URINE CULTURE: Colony Count: >=100000

## 2014-04-02 ENCOUNTER — Other Ambulatory Visit: Payer: Self-pay

## 2014-04-02 MED ORDER — INSULIN LISPRO PROT & LISPRO (75-25 MIX) 100 UNIT/ML ~~LOC~~ SUSP: SUBCUTANEOUS | Status: DC

## 2014-04-03 ENCOUNTER — Telehealth: Payer: Self-pay

## 2014-04-03 ENCOUNTER — Telehealth: Payer: Self-pay | Admitting: Internal Medicine

## 2014-04-03 MED ORDER — INSULIN LISPRO PROT & LISPRO (75-25 MIX) 100 UNIT/ML ~~LOC~~ SUSP: SUBCUTANEOUS | Status: DC

## 2014-04-03 NOTE — Telephone Encounter (Signed)
Pt came into the office on 04/02/14 for samples of Humalog and stated he was in the donut hole and needed assistance getting his medication.  Advised pt that I had found a program through [employer] and we needed proof of income and a copy of his medicare part D card. Pt states he will bring this information to the office on 04/04/14.

## 2014-04-03 NOTE — Addendum Note (Signed)
Addended by: Colleen Can on: 04/03/2014 03:58 PM   Modules accepted: Orders

## 2014-04-03 NOTE — Telephone Encounter (Signed)
[facility] is needing a referral. Pt has appt this morning, needs referral for dr. Jasmine December.

## 2014-04-03 NOTE — Telephone Encounter (Signed)
Referral placed in online portal.

## 2014-04-04 NOTE — Telephone Encounter (Signed)
Pt came into office and bought in information.

## 2014-04-06 ENCOUNTER — Telehealth (HOSPITAL_BASED_OUTPATIENT_CLINIC_OR_DEPARTMENT_OTHER): Payer: Self-pay | Admitting: Emergency Medicine

## 2014-04-06 NOTE — Telephone Encounter (Signed)
+  Urine. Per pharmacist, patient treated with Keflex. Sensitive to same.

## 2014-04-07 NOTE — Telephone Encounter (Signed)
Faxed over to assistance program on 6/12 and confirmation fax received.

## 2014-06-16 ENCOUNTER — Ambulatory Visit: Payer: Medicare HMO | Admitting: Internal Medicine

## 2014-07-07 ENCOUNTER — Encounter: Payer: Self-pay | Admitting: Gastroenterology

## 2014-07-07 ENCOUNTER — Encounter: Payer: Self-pay | Admitting: Internal Medicine

## 2014-08-15 ENCOUNTER — Telehealth: Payer: Self-pay | Admitting: Internal Medicine

## 2014-08-15 MED ORDER — GLUCOSE BLOOD VI STRP: 1.0000 | ORAL_STRIP | Freq: Two times a day (BID) | Status: DC

## 2014-08-15 NOTE — Telephone Encounter (Signed)
[facility], [facility] is requesting re-fill on [place]

## 2014-08-15 NOTE — Telephone Encounter (Signed)
rx sent in electronically 

## 2014-08-21 ENCOUNTER — Ambulatory Visit (INDEPENDENT_AMBULATORY_CARE_PROVIDER_SITE_OTHER): Payer: Medicare HMO | Admitting: Family Medicine

## 2014-08-21 ENCOUNTER — Encounter: Payer: Self-pay | Admitting: Family Medicine

## 2014-08-21 VITALS — BP 135/86 | Temp 98.4°F | Wt 223.0 lb

## 2014-08-21 DIAGNOSIS — E1165 Type 2 diabetes mellitus with hyperglycemia: Secondary | ICD-10-CM

## 2014-08-21 DIAGNOSIS — I2510 Atherosclerotic heart disease of native coronary artery without angina pectoris: Secondary | ICD-10-CM

## 2014-08-21 DIAGNOSIS — E785 Hyperlipidemia, unspecified: Secondary | ICD-10-CM

## 2014-08-21 DIAGNOSIS — IMO0002 Reserved for concepts with insufficient information to code with codable children: Secondary | ICD-10-CM

## 2014-08-21 DIAGNOSIS — I10 Essential (primary) hypertension: Secondary | ICD-10-CM

## 2014-08-21 LAB — BASIC METABOLIC PANEL
BUN: 28 mg/dL — ABNORMAL HIGH (ref 6–23)
CO2: 26 mEq/L (ref 19–32)
Calcium: 9.1 mg/dL (ref 8.4–10.5)
Chloride: 105 mEq/L (ref 96–112)
Creatinine, Ser: 1.4 mg/dL (ref 0.4–1.5)
GFR: 61.49 mL/min (ref >60.00)
Glucose, Bld: 203 mg/dL — ABNORMAL HIGH (ref 70–99)
Potassium: 3.9 mEq/L (ref 3.5–5.1)
Sodium: 139 mEq/L (ref 135–145)

## 2014-08-21 LAB — HEMOGLOBIN A1C: Hgb A1c MFr Bld: 8 % — ABNORMAL HIGH (ref 4.6–6.5)

## 2014-08-21 MED ORDER — BENAZEPRIL HCL 40 MG PO TABS: 40.0000 mg | ORAL_TABLET | Freq: Every day | ORAL | Status: DC

## 2014-08-21 MED ORDER — HYDROCHLOROTHIAZIDE 25 MG PO TABS: 25.0000 mg | ORAL_TABLET | Freq: Every day | ORAL | Status: DC

## 2014-08-21 MED ORDER — LOVASTATIN 20 MG PO TABS: 20.0000 mg | ORAL_TABLET | Freq: Every day | ORAL | Status: DC

## 2014-08-21 NOTE — Progress Notes (Signed)
[name] Reddish, MD Phone: [phone]  Subjective:  Patient presents today to establish care with me as their new primary care provider. Patient was formerly a patient of Dr. Arnoldo Morale. Chief complaint-noted.   DIABETES Type II-poor control by last a1c, reasonable by CBGs at home  Lab Results  Component Value Date   HGBA1C 8.4* 12/13/2013   HGBA1C 9.5* 04/05/2013   HGBA1C 8.9* 02/15/2013  Medications taking and tolerating-yes , insulin 75/25 Blood Sugars per patient-fasting- usually 120-130,  Usually around 140-150 On Aspirin-yes On statin-yes  ROS- Denies vision chnages, feet or hand numbness/tingling. Denies Hypoglycemia symptoms (shaky, sweaty, hungry, weak anxious, tremor, palpitations, confusion, behavior change).   CAD-asymptomatic, medical management Hyperlipidemia-well controlled previously with no recent change in statin.   Lab Results  Component Value Date   LDLCALC 38 12/19/2011  Takes 325 asa On statin: lovastatin 20mg  CABG 6 years ago and has done well since that time. Does not follow with cardiology.  ROS- no chest pain or shortness of breath. No myalgias  Hypertension-well controlled Patient having difficulty affording medication as he is just gotten into the doughnut hole. He will be paying $166 for 3 months for benazepril hydrochlorothiazide combination pill. I called his insurance during the visit and spent over 10 minutes on the phone trying to find a regimen that worked. Eventually prescribed benazepril 40 mg and hydrochlorothiazide 25 mg which will only cost and $17 per 3 months. BP Readings from Last 3 Encounters:  08/21/14 135/86  03/29/14 124/70  12/13/13 146/84  Compliant with medications-yes without side effects ROS-Denies any CP, HA, SOB, blurry vision.  The following were reviewed and entered/updated in epic: Past Medical History  Diagnosis Date  . Hyperlipidemia   . Hypertension   . Elevated PSA   . CAD (coronary artery disease)   . Diabetes  mellitus   . GERD (gastroesophageal reflux disease)   . Hemorrhoids   . Hx of adenomatous colonic polyps   . Arthritis   . CAD (coronary artery disease)     post diaphragjatic wall infarction and subsequent coronary artery bypass graft surgery in 2003, now stable  . Prostate cancer    Patient Active Problem List   Diagnosis Date Noted  . Diabetes mellitus type II, uncontrolled 06/04/2007    Priority: High  . CAD (coronary artery disease) 06/04/2007    Priority: High  . NEOPLASM, MALIGNANT, PROSTATE 08/21/2008    Priority: Medium  . Hyperlipidemia 06/04/2007    Priority: Medium  . Essential hypertension 06/04/2007    Priority: Medium  . Constipation 06/16/2010    Priority: Low  . GERD 08/04/2009    Priority: Low  . Umbilical hernia 78/58/8502    Priority: Low  . Hiatal hernia 02/13/2008    Priority: Low  . DEGENERATIVE DISC DISEASE, CERVICAL SPINE 02/13/2008    Priority: Low   Past Surgical History  Procedure Laterality Date  . Coronary artery bypass graft      Family History  Problem Relation Age of Onset  . Heart disease Mother   . Diabetes Mother     Medications- reviewed and updated Current Outpatient Prescriptions  Medication Sig Dispense Refill  . ACCU-CHEK SOFTCLIX LANCETS lancets USE  1  LANCET TWICE DAILY  200 each  3  . aspirin (ECOTRIN) 325 MG EC tablet Take 325 mg by mouth daily.        . B-D UF III MINI PEN NEEDLES 31G X 5 MM MISC USE TWICE DAILY  100 each  10  . furosemide (  LASIX) 40 MG tablet Take 1 tablet (40 mg total) by mouth every morning.  30 tablet  11  . glucose blood (ACCU-CHEK AVIVA) test strip 1 each by Other route 2 (two) times daily. Use as instructed  200 each  3  . insulin lispro protamine-lispro (HUMALOG 75/25 MIX) (75-25) 100 UNIT/ML SUSP injection Take 24 units each morning with breakfast and 20 units each evening with supper.  3 mL  1  . insulin lispro protamine-lispro (HUMALOG 75/25 MIX) (75-25) 100 UNIT/ML SUSP injection Take 24  units each morning with breakfast and 20 units each evening with supper.  15 mL  5  . lovastatin (MEVACOR) 20 MG tablet Take 1 tablet (20 mg total) by mouth at bedtime.  90 tablet  3  . vitamin B-12 (CYANOCOBALAMIN) 100 MCG tablet Take 50 mcg by mouth daily.        . vitamin C (ASCORBIC ACID) 500 MG tablet Take 500 mg by mouth daily.        . benazepril (LOTENSIN) 40 MG tablet Take 1 tablet (40 mg total) by mouth daily.  90 tablet  3  . hydrochlorothiazide (HYDRODIURIL) 25 MG tablet Take 1 tablet (25 mg total) by mouth daily.  90 tablet  3  . [DISCONTINUED] Insulin Syringe-Needle U-100 (B-D INS SYRINGE 0.5CC/31GX5/16) 31G X 5/16" 0.5 ML MISC 1 each by Does not apply route 2 (two) times daily.  100 each  11   No current facility-administered medications for this visit.    Allergies-reviewed and updated No Known Allergies  History   Social History  . Marital Status: Divorced    Spouse Name: N/A    Number of Children: N/A  . Years of Education: N/A   Occupational History  . Retired [employer]   Social History Main Topics  . Smoking status: Former Smoker    Quit date: 10/25/1948  . Smokeless tobacco: Never Used  . Alcohol Use: No  . Drug Use: No  . Sexual Activity: Not Currently   Other Topics Concern  . None   Social History Narrative   Divorced. 4 children. 7 grandkids. 2 greatgrandkids.       Retired from working at [employer] in [employer]: go to gym 3 days a week, tv and sports    ROS--See HPI   Objective: BP 135/86  Temp(Src) 98.4 F (36.9 C)  Wt 223 lb (101.152 kg) Gen: NAD, resting comfortably in chair  HEENT: Mucous membranes are moist. Oropharynx normal CV: RRR no murmurs rubs or gallops Lungs: CTAB no crackles, wheeze, rhonchi Abdomen: soft/nontender/nondistended/normal bowel sounds.  Ext: 1+ edema left ankle but equal calf size 10 cm below tibial plateau Skin: warm, dry, no rash Neuro: grossly normal, moves all extremities    Assessment/Plan:  Diabetes mellitus type II, uncontrolled Poor control by A1c but good control per CBGs. Continue Humalog 75/25 with 24 units in the morning and 20 units at night. Check A1c today. Foot exam next visit  CAD (coronary artery disease) Well controlled with medical management. ASA, statin. BP control.   Hyperlipidemia Well controlled previously. With next set of bloodwork, consider repeat LDL.continue lovastatin and refilled today.   Essential hypertension See history of present illness. DC benazepril hydrochlorothiazide combination. Start benazepril 40 mg at night and hydrochlorothiazide 25 mg during the day. Check bmet to follow kidney function today   nonfasting Orders Placed This Encounter  Procedures  . Basic metabolic panel    [place]  .  Hemoglobin A1c    [place]    Meds ordered this encounter  Medications  . benazepril (LOTENSIN) 40 MG tablet    Sig: Take 1 tablet (40 mg total) by mouth daily.    Dispense:  90 tablet    Refill:  3  . hydrochlorothiazide (HYDRODIURIL) 25 MG tablet    Sig: Take 1 tablet (25 mg total) by mouth daily.    Dispense:  90 tablet    Refill:  3  . lovastatin (MEVACOR) 20 MG tablet    Sig: Take 1 tablet (20 mg total) by mouth at bedtime.    Dispense:  90 tablet    Refill:  3

## 2014-08-21 NOTE — Assessment & Plan Note (Addendum)
Well controlled previously. With next set of bloodwork, consider repeat LDL.continue lovastatin and refilled today.

## 2014-08-21 NOTE — Assessment & Plan Note (Signed)
Poor control by A1c but good control per CBGs. Continue Humalog 75/25 with 24 units in the morning and 20 units at night. Check A1c today. Foot exam next visit

## 2014-08-21 NOTE — Patient Instructions (Signed)
Great to meet you!   I would like to keep your a1c below 8.5. Let's check one today. No changes unless labs show anything concerning.   See me back in 3 months  Hope the new blood pressure combination is much more affordable. Take benazepril in the evening and hydrochlorothiazide in the morning.

## 2014-08-21 NOTE — Assessment & Plan Note (Signed)
See history of present illness. DC benazepril hydrochlorothiazide combination. Start benazepril 40 mg at night and hydrochlorothiazide 25 mg during the day. Check bmet to follow kidney function today

## 2014-08-21 NOTE — Assessment & Plan Note (Signed)
Well controlled with medical management. ASA, statin. BP control.

## 2014-08-28 ENCOUNTER — Encounter: Payer: Self-pay | Admitting: Family Medicine

## 2014-08-28 ENCOUNTER — Ambulatory Visit (INDEPENDENT_AMBULATORY_CARE_PROVIDER_SITE_OTHER): Payer: Medicare HMO | Admitting: Family Medicine

## 2014-08-28 VITALS — BP 134/68 | HR 77 | Temp 98.0°F | Wt 226.0 lb

## 2014-08-28 DIAGNOSIS — R05 Cough: Secondary | ICD-10-CM

## 2014-08-28 DIAGNOSIS — R059 Cough, unspecified: Secondary | ICD-10-CM

## 2014-08-28 NOTE — Progress Notes (Signed)
Pre visit review using our clinic review tool, if applicable. No additional management support is needed unless otherwise documented below in the visit note. 

## 2014-08-28 NOTE — Progress Notes (Signed)
   Subjective:    Patient ID: [name], male    DOB: [birth year], 80 y.o.   MRN: [id number]  HPI Acute visit. 80 year old ex-smoker seen with onset about 5 days ago of cough and nasal congestion. He's not had any fever. He's had some mild hoarseness. Denies any sore throat. No nausea or vomiting. No dyspnea. He's had good appetite and no other complaints. Took some leftover Mucinex yesterday. He quit smoking about 50 years ago. Denies any sick contacts.  Past Medical History  Diagnosis Date  . Hyperlipidemia   . Hypertension   . Elevated PSA   . CAD (coronary artery disease)   . Diabetes mellitus   . GERD (gastroesophageal reflux disease)   . Hemorrhoids   . Hx of adenomatous colonic polyps   . Arthritis   . CAD (coronary artery disease)     post diaphragjatic wall infarction and subsequent coronary artery bypass graft surgery in 2003, now stable  . Prostate cancer    Past Surgical History  Procedure Laterality Date  . Coronary artery bypass graft      reports that he quit smoking about 65 years ago. He has never used smokeless tobacco. He reports that he does not drink alcohol or use illicit drugs. family history includes Diabetes in his mother; Heart disease in his mother. No Known Allergies'   Review of Systems  Constitutional: Negative for fever and chills.  HENT: Positive for congestion.   Respiratory: Positive for cough.   Gastrointestinal: Negative for nausea and vomiting.       Objective:   Physical Exam  Constitutional: He appears well-developed and well-nourished.  HENT:  Right Ear: External ear normal.  Left Ear: External ear normal.  Mouth/Throat: Oropharynx is clear and moist.  Neck: Neck supple. No thyromegaly present.  Cardiovascular: Normal rate and regular rhythm.   Pulmonary/Chest: Effort normal and breath sounds normal. No respiratory distress. He has no wheezes. He has no rales.          Assessment & Plan:  Cough. Suspect acute viral  process. He has non-focal exam and no fever. Reassurance. He will try over-the-counter Mucinex. Follow-up promptly for fever, increased shortness of breath, or worsening symptoms.

## 2014-08-28 NOTE — Patient Instructions (Signed)
Acute Bronchitis Bronchitis is inflammation of the airways that extend from the windpipe into the lungs (bronchi). The inflammation often causes mucus to develop. This leads to a cough, which is the most common symptom of bronchitis.  In acute bronchitis, the condition usually develops suddenly and goes away over time, usually in a couple weeks. Smoking, allergies, and asthma can make bronchitis worse. Repeated episodes of bronchitis may cause further lung problems.  CAUSES Acute bronchitis is most often caused by the same virus that causes a cold. The virus can spread from person to person (contagious) through coughing, sneezing, and touching contaminated objects. SIGNS AND SYMPTOMS   Cough.   Fever.   Coughing up mucus.   Body aches.   Chest congestion.   Chills.   Shortness of breath.   Sore throat.  DIAGNOSIS  Acute bronchitis is usually diagnosed through a physical exam. Your health care provider will also ask you questions about your medical history. Tests, such as chest X-rays, are sometimes done to rule out other conditions.  TREATMENT  Acute bronchitis usually goes away in a couple weeks. Oftentimes, no medical treatment is necessary. Medicines are sometimes given for relief of fever or cough. Antibiotic medicines are usually not needed but may be prescribed in certain situations. In some cases, an inhaler may be recommended to help reduce shortness of breath and control the cough. A cool mist vaporizer may also be used to help thin bronchial secretions and make it easier to clear the chest.  HOME CARE INSTRUCTIONS  Get plenty of rest.   Drink enough fluids to keep your urine clear or pale yellow (unless you have a medical condition that requires fluid restriction). Increasing fluids may help thin your respiratory secretions (sputum) and reduce chest congestion, and it will prevent dehydration.   Take medicines only as directed by your health care provider.  If  you were prescribed an antibiotic medicine, finish it all even if you start to feel better.  Avoid smoking and secondhand smoke. Exposure to cigarette smoke or irritating chemicals will make bronchitis worse. If you are a smoker, consider using nicotine gum or skin patches to help control withdrawal symptoms. Quitting smoking will help your lungs heal faster.   Reduce the chances of another bout of acute bronchitis by washing your hands frequently, avoiding people with cold symptoms, and trying not to touch your hands to your mouth, nose, or eyes.   Keep all follow-up visits as directed by your health care provider.  SEEK MEDICAL CARE IF: Your symptoms do not improve after 1 week of treatment.  SEEK IMMEDIATE MEDICAL CARE IF:  You develop an increased fever or chills.   You have chest pain.   You have severe shortness of breath.  You have bloody sputum.   You develop dehydration.  You faint or repeatedly feel like you are going to pass out.  You develop repeated vomiting.  You develop a severe headache. MAKE SURE YOU:   Understand these instructions.  Will watch your condition.  Will get help right away if you are not doing well or get worse. Document Released: 11/17/2004 Document Revised: 02/24/2014 Document Reviewed: 04/02/2013 [facility] Patient Information 2015 [place], [place]. This information is not intended to replace advice given to you by your health care provider. Make sure you discuss any questions you have with your health care provider.  Consider over the counter Mucinex twice daily for cough and nasal congestion.

## 2014-10-13 ENCOUNTER — Telehealth: Payer: Self-pay | Admitting: Family Medicine

## 2014-10-13 NOTE — Telephone Encounter (Signed)
Spoke with [place] rep from [facility] and she states that they are no longer refilling medications for pts until after 10/24/14. Pt needs to be sure that he needs to fill out the package that he should be receiving in order to stay active in this program. Pt will come pick up sample of Humalog 75/25.

## 2014-10-13 NOTE — Telephone Encounter (Signed)
Pt needs humalog 75/25  Call into [facility] patient assistant program.[facility] phone # [phone]. Pt has one vial left

## 2014-10-20 ENCOUNTER — Telehealth: Payer: Self-pay | Admitting: Family Medicine

## 2014-10-20 ENCOUNTER — Other Ambulatory Visit: Payer: Self-pay | Admitting: *Deleted

## 2014-10-20 MED ORDER — BENAZEPRIL HCL 40 MG PO TABS: 40.0000 mg | ORAL_TABLET | Freq: Every day | ORAL | Status: DC

## 2014-10-20 MED ORDER — LOVASTATIN 20 MG PO TABS: 20.0000 mg | ORAL_TABLET | Freq: Every day | ORAL | Status: DC

## 2014-10-20 NOTE — Telephone Encounter (Addendum)
Pt request refill of the following:  benazepril (LOTENSIN) 40 MG tablet,  lovastatin (MEVACOR) 20 MG tablet, TAMSULOSIN .4 MG   Phamacy: [facility]

## 2014-10-20 NOTE — Telephone Encounter (Signed)
Medication refilled and sent to [facility] 

## 2014-10-23 ENCOUNTER — Telehealth: Payer: Self-pay | Admitting: Family Medicine

## 2014-10-23 NOTE — Telephone Encounter (Signed)
Opened in error

## 2014-10-30 ENCOUNTER — Other Ambulatory Visit: Payer: Self-pay

## 2014-10-30 MED ORDER — INSULIN LISPRO PROT & LISPRO (75-25 MIX) 100 UNIT/ML KWIKPEN: PEN_INJECTOR | SUBCUTANEOUS | Status: DC

## 2014-10-30 NOTE — Telephone Encounter (Signed)
Received paperwork for [facility].  Paper work faxed to [phone] along with a prescription.  Pt given 2 samples until program starts.

## 2014-11-03 NOTE — Telephone Encounter (Signed)
Patient states [facility] never received the below mentioned forms.  He needs it re-faxed to [phone].  He also said you can call [facility] at [phone] to give a verbal approval until the forms can be re-faxed. They will send him 1 month supply if you call them.

## 2014-11-04 NOTE — Telephone Encounter (Signed)
Forms have been faxed twice, faxed again this morning and will do a f/u call with [facility].

## 2014-11-17 DIAGNOSIS — C61 Malignant neoplasm of prostate: Secondary | ICD-10-CM | POA: Diagnosis not present

## 2014-11-17 DIAGNOSIS — R32 Unspecified urinary incontinence: Secondary | ICD-10-CM | POA: Diagnosis not present

## 2014-11-17 DIAGNOSIS — R339 Retention of urine, unspecified: Secondary | ICD-10-CM | POA: Diagnosis not present

## 2014-11-17 DIAGNOSIS — N400 Enlarged prostate without lower urinary tract symptoms: Secondary | ICD-10-CM | POA: Diagnosis not present

## 2014-11-17 DIAGNOSIS — N320 Bladder-neck obstruction: Secondary | ICD-10-CM | POA: Diagnosis not present

## 2014-11-20 ENCOUNTER — Ambulatory Visit (INDEPENDENT_AMBULATORY_CARE_PROVIDER_SITE_OTHER): Payer: Medicare HMO | Admitting: Family Medicine

## 2014-11-20 ENCOUNTER — Encounter: Payer: Self-pay | Admitting: Family Medicine

## 2014-11-20 VITALS — BP 122/54 | Temp 97.9°F | Wt 230.0 lb

## 2014-11-20 DIAGNOSIS — I10 Essential (primary) hypertension: Secondary | ICD-10-CM | POA: Diagnosis not present

## 2014-11-20 DIAGNOSIS — I2510 Atherosclerotic heart disease of native coronary artery without angina pectoris: Secondary | ICD-10-CM

## 2014-11-20 DIAGNOSIS — E1165 Type 2 diabetes mellitus with hyperglycemia: Secondary | ICD-10-CM | POA: Diagnosis not present

## 2014-11-20 DIAGNOSIS — IMO0002 Reserved for concepts with insufficient information to code with codable children: Secondary | ICD-10-CM

## 2014-11-20 NOTE — Patient Instructions (Addendum)
Blood pressure looks great in office. Try to wait at least an hour after your blood pressure medicine before checking at home.   Diabetes sounds pretty stable. Let's increase your morning dose to 25 units and continue 20 units before dinner. Gave you 2 samples today. Call us if things don't get squared away with [facility].   Glad no chest pain or shortness of breat-obviously let us know if these occur. Continue aspirin and statin.   Foot exam looks fine. See podiatrist to help them trim your nails/onychomychosis.   We will check labs next visit.

## 2014-11-20 NOTE — Assessment & Plan Note (Signed)
Controlled with Benazepril 40 qhs, HCTZ 25mg  in day and Lasix 40mg  (primarily for ankle swelling) in office. Mild poor control at home but before taking bp meds in morning.

## 2014-11-20 NOTE — Assessment & Plan Note (Signed)
Humalog 75/25 increase to 25 units AM, continue 20 units PM. No lows reported. Check a1c next visit. Given age and comorbidites a1c goal 8.5 reasonable though would like to push below 8 as long as no hypoglycemia.

## 2014-11-20 NOTE — Assessment & Plan Note (Signed)
Asymptomatic. Continue asa, statin, BP control. Does not follow with cardiology. Continue 3x a week exercise at [facility].

## 2014-11-20 NOTE — Progress Notes (Signed)
[name] Reddish, MD Phone: [phone]  Subjective:  Patient presents today to establish care with me as their new primary care provider. Patient was formerly a patient of Dr. Arnoldo Morale. Chief complaint-noted.   DIABETES Type II-poor control by last a1c, reasonable by CBGs at home  Lab Results  Component Value Date   HGBA1C 8.0* 08/21/2014   HGBA1C 8.4* 12/13/2013   HGBA1C 9.5* 04/05/2013  Medications taking and tolerating-yes , insulin 75/25 Blood Sugars per patient-fasting- usually 120-130 once again,  Other times a day afternoons around 140-150 once again On Aspirin-yes On statin-yes Still with exercise 3x a week at [facility] ROS- Denies vision chnages, feet or hand numbness/tingling. Denies Hypoglycemia symptoms (shaky, sweaty, hungry, weak anxious, tremor, palpitations, confusion, behavior change).   CAD-asymptomatic, medical management Takes 325 asa, lovastatin ROS- no chest pain or shortness of breath. No myalgias  Hypertension-well controlled on recent change to benazepril 40 mg and hydrochlorothiazide 25 mg, lasix 40mg  BP Readings from Last 3 Encounters:  11/20/14 122/54  08/28/14 134/68  08/21/14 135/86  Compliant with medications-yes without side effects Home bp this morning 140/84 and he is worried too elevated ROS-Denies any CP, HA, SOB, blurry vision.  The following were reviewed and entered/updated in epic: Past Medical History  Diagnosis Date  . Hyperlipidemia   . Hypertension   . Elevated PSA   . CAD (coronary artery disease)   . Diabetes mellitus   . GERD (gastroesophageal reflux disease)   . Hemorrhoids   . Hx of adenomatous colonic polyps   . Arthritis   . CAD (coronary artery disease)     post diaphragjatic wall infarction and subsequent coronary artery bypass graft surgery in 2003, now stable  . Prostate cancer    Patient Active Problem List   Diagnosis Date Noted  . Diabetes mellitus type II, uncontrolled 06/04/2007    Priority: High  . CAD  (coronary artery disease) 06/04/2007    Priority: High  . NEOPLASM, MALIGNANT, PROSTATE 08/21/2008    Priority: Medium  . Hyperlipidemia 06/04/2007    Priority: Medium  . Essential hypertension 06/04/2007    Priority: Medium  . Constipation 06/16/2010    Priority: Low  . GERD 08/04/2009    Priority: Low  . Umbilical hernia 37/85/8850    Priority: Low  . Hiatal hernia 02/13/2008    Priority: Low  . DEGENERATIVE DISC DISEASE, CERVICAL SPINE 02/13/2008    Priority: Low   Past Surgical History  Procedure Laterality Date  . Coronary artery bypass graft      Family History  Problem Relation Age of Onset  . Heart disease Mother   . Diabetes Mother     Medications- reviewed and updated Current Outpatient Prescriptions  Medication Sig Dispense Refill  . ACCU-CHEK SOFTCLIX LANCETS lancets USE  1  LANCET TWICE DAILY 200 each 3  . aspirin (ECOTRIN) 325 MG EC tablet Take 325 mg by mouth daily.      . B-D UF III MINI PEN NEEDLES 31G X 5 MM MISC USE TWICE DAILY 100 each 10  . benazepril (LOTENSIN) 40 MG tablet Take 1 tablet (40 mg total) by mouth daily. 90 tablet 1  . furosemide (LASIX) 40 MG tablet Take 1 tablet (40 mg total) by mouth every morning. 30 tablet 11  . glucose blood (ACCU-CHEK AVIVA) test strip 1 each by Other route 2 (two) times daily. Use as instructed 200 each 3  . hydrochlorothiazide (HYDRODIURIL) 25 MG tablet Take 1 tablet (25 mg total) by mouth daily. 90 tablet  3  . Insulin Lispro Prot & Lispro (HUMALOG 75/25 MIX) (75-25) 100 UNIT/ML Kwikpen Take 24 units each morning with breakfast and 20 units each evening with supper. 20 pen 3  . lovastatin (MEVACOR) 20 MG tablet Take 1 tablet (20 mg total) by mouth at bedtime. 90 tablet 1  . vitamin B-12 (CYANOCOBALAMIN) 100 MCG tablet Take 50 mcg by mouth daily.      . vitamin C (ASCORBIC ACID) 500 MG tablet Take 500 mg by mouth daily.      . [DISCONTINUED] Insulin Syringe-Needle U-100 (B-D INS SYRINGE 0.5CC/31GX5/16) 31G X 5/16"  0.5 ML MISC 1 each by Does not apply route 2 (two) times daily. 100 each 11   Allergies-reviewed and updated No Known Allergies  History   Social History  . Marital Status: Divorced    Spouse Name: N/A    Number of Children: N/A  . Years of Education: N/A   Occupational History  . Retired [employer]   Social History Main Topics  . Smoking status: Former Smoker    Quit date: 10/25/1948  . Smokeless tobacco: Never Used  . Alcohol Use: No  . Drug Use: No  . Sexual Activity: Not Currently   Other Topics Concern  . None   Social History Narrative   Divorced. 4 children. 7 grandkids. 2 greatgrandkids.       Retired from working at [employer] in [employer]: go to gym 3 days a week, tv and sports    ROS--See HPI   Objective: BP 122/54 mmHg  Temp(Src) 97.9 F (36.6 C)  Wt 230 lb (104.327 kg) Gen: NAD, resting comfortably in chair  HEENT: Mucous membranes are moist.  CV: RRR no murmurs rubs or gallops Lungs: CTAB no crackles, wheeze, rhonchi Abdomen: soft/nontender/nondistended/normal bowel sounds.  Ext: 1+ edema pitting left ankle but equal calf size 10 cm below tibial plateau Skin: warm, dry, no rash Neuro: grossly normal, moves all extremities   Diabetic Foot Exam - Simple   Simple Foot Form  Visual Inspection  See comments:  Yes  Sensation Testing  Intact to touch and monofilament testing bilaterally:  Yes  Pulse Check  Posterior Tibialis and Dorsalis pulse intact bilaterally:  Yes  Comments  1+ pulses. Onychomycosis noted     Assessment/Plan:  Diabetes mellitus type II, uncontrolled Humalog 75/25 increase to 25 units AM, continue 20 units PM. No lows reported. Check a1c next visit. Given age and comorbidites a1c goal 8.5 reasonable though would like to push below 8 as long as no hypoglycemia.    Essential hypertension Controlled with Benazepril 40 qhs, HCTZ 25mg  in day and Lasix 40mg  (primarily for ankle swelling) in  office. Mild poor control at home but before taking bp meds in morning.    CAD (coronary artery disease) Asymptomatic. Continue asa, statin, BP control. Does not follow with cardiology. Continue 3x a week exercise at [facility].     Labs next visit- a1c, cmet, direct ldl , cbc. Follow up 3 months.

## 2014-12-01 ENCOUNTER — Other Ambulatory Visit: Payer: Self-pay

## 2014-12-01 DIAGNOSIS — N320 Bladder-neck obstruction: Secondary | ICD-10-CM | POA: Diagnosis not present

## 2014-12-01 DIAGNOSIS — C61 Malignant neoplasm of prostate: Secondary | ICD-10-CM | POA: Diagnosis not present

## 2014-12-01 DIAGNOSIS — R339 Retention of urine, unspecified: Secondary | ICD-10-CM | POA: Diagnosis not present

## 2014-12-01 DIAGNOSIS — N400 Enlarged prostate without lower urinary tract symptoms: Secondary | ICD-10-CM | POA: Diagnosis not present

## 2014-12-01 DIAGNOSIS — R32 Unspecified urinary incontinence: Secondary | ICD-10-CM | POA: Diagnosis not present

## 2014-12-01 MED ORDER — INSULIN LISPRO PROT & LISPRO (75-25 MIX) 100 UNIT/ML KWIKPEN: PEN_INJECTOR | SUBCUTANEOUS | Status: DC

## 2014-12-02 ENCOUNTER — Telehealth: Payer: Self-pay

## 2014-12-02 ENCOUNTER — Other Ambulatory Visit: Payer: Self-pay

## 2014-12-02 MED ORDER — INSULIN LISPRO PROT & LISPRO (75-25 MIX) 100 UNIT/ML KWIKPEN: PEN_INJECTOR | SUBCUTANEOUS | Status: DC

## 2014-12-02 NOTE — Telephone Encounter (Signed)
[facility] refill request for BD ULTRA FINE NANO 32G4MM EA. USE BID. Last filled 10/28/2014

## 2014-12-02 NOTE — Telephone Encounter (Signed)
[facility] refill request for [employer] KWIKPEN 75-25U/ML INJ. INJECT 15 UNITS INTO SKIN BID WITH A MEAL

## 2014-12-02 NOTE — Telephone Encounter (Signed)
Medication refilled

## 2014-12-03 MED ORDER — INSULIN PEN NEEDLE 31G X 5 MM MISC: Status: DC

## 2014-12-03 NOTE — Telephone Encounter (Signed)
Refill sent in

## 2014-12-18 DIAGNOSIS — N400 Enlarged prostate without lower urinary tract symptoms: Secondary | ICD-10-CM | POA: Diagnosis not present

## 2014-12-18 DIAGNOSIS — N320 Bladder-neck obstruction: Secondary | ICD-10-CM | POA: Diagnosis not present

## 2014-12-18 DIAGNOSIS — C61 Malignant neoplasm of prostate: Secondary | ICD-10-CM | POA: Diagnosis not present

## 2014-12-18 DIAGNOSIS — R32 Unspecified urinary incontinence: Secondary | ICD-10-CM | POA: Diagnosis not present

## 2014-12-18 DIAGNOSIS — R339 Retention of urine, unspecified: Secondary | ICD-10-CM | POA: Diagnosis not present

## 2014-12-25 ENCOUNTER — Other Ambulatory Visit: Payer: Self-pay | Admitting: *Deleted

## 2014-12-25 ENCOUNTER — Telehealth: Payer: Self-pay

## 2014-12-25 DIAGNOSIS — R609 Edema, unspecified: Secondary | ICD-10-CM

## 2014-12-25 MED ORDER — FUROSEMIDE 40 MG PO TABS: 40.0000 mg | ORAL_TABLET | Freq: Every morning | ORAL | Status: DC

## 2014-12-25 NOTE — Telephone Encounter (Signed)
Pt is aware and will pick up tomorrow. °

## 2014-12-25 NOTE — Telephone Encounter (Signed)
Received a shipment [facility] for Humulin 70/30 kwikpen 100U?ML.  Received 4 boxes.  Left a message for pt to return call to pick up samples.

## 2015-01-16 DIAGNOSIS — N400 Enlarged prostate without lower urinary tract symptoms: Secondary | ICD-10-CM | POA: Diagnosis not present

## 2015-01-16 DIAGNOSIS — R32 Unspecified urinary incontinence: Secondary | ICD-10-CM | POA: Diagnosis not present

## 2015-01-16 DIAGNOSIS — N320 Bladder-neck obstruction: Secondary | ICD-10-CM | POA: Diagnosis not present

## 2015-01-16 DIAGNOSIS — C61 Malignant neoplasm of prostate: Secondary | ICD-10-CM | POA: Diagnosis not present

## 2015-01-16 DIAGNOSIS — R339 Retention of urine, unspecified: Secondary | ICD-10-CM | POA: Diagnosis not present

## 2015-02-16 DIAGNOSIS — C61 Malignant neoplasm of prostate: Secondary | ICD-10-CM | POA: Diagnosis not present

## 2015-02-16 DIAGNOSIS — R32 Unspecified urinary incontinence: Secondary | ICD-10-CM | POA: Diagnosis not present

## 2015-02-16 DIAGNOSIS — N400 Enlarged prostate without lower urinary tract symptoms: Secondary | ICD-10-CM | POA: Diagnosis not present

## 2015-02-16 DIAGNOSIS — R339 Retention of urine, unspecified: Secondary | ICD-10-CM | POA: Diagnosis not present

## 2015-02-16 DIAGNOSIS — N320 Bladder-neck obstruction: Secondary | ICD-10-CM | POA: Diagnosis not present

## 2015-02-20 ENCOUNTER — Ambulatory Visit (INDEPENDENT_AMBULATORY_CARE_PROVIDER_SITE_OTHER): Payer: Commercial Managed Care - HMO | Admitting: Family Medicine

## 2015-02-20 ENCOUNTER — Encounter: Payer: Self-pay | Admitting: Family Medicine

## 2015-02-20 VITALS — BP 110/78 | HR 73 | Temp 98.0°F | Wt 228.0 lb

## 2015-02-20 DIAGNOSIS — E1165 Type 2 diabetes mellitus with hyperglycemia: Secondary | ICD-10-CM

## 2015-02-20 DIAGNOSIS — Z23 Encounter for immunization: Secondary | ICD-10-CM | POA: Diagnosis not present

## 2015-02-20 DIAGNOSIS — I2510 Atherosclerotic heart disease of native coronary artery without angina pectoris: Secondary | ICD-10-CM | POA: Diagnosis not present

## 2015-02-20 DIAGNOSIS — IMO0002 Reserved for concepts with insufficient information to code with codable children: Secondary | ICD-10-CM

## 2015-02-20 DIAGNOSIS — I10 Essential (primary) hypertension: Secondary | ICD-10-CM

## 2015-02-20 DIAGNOSIS — E785 Hyperlipidemia, unspecified: Secondary | ICD-10-CM

## 2015-02-20 LAB — LIPID PANEL
Cholesterol: 192 mg/dL (ref 0–200)
HDL: 38.9 mg/dL — ABNORMAL LOW (ref >39.00)
LDL Cholesterol: 117 mg/dL — ABNORMAL HIGH (ref 0–99)
NonHDL: 153.1
Total CHOL/HDL Ratio: 5
Triglycerides: 181 mg/dL — ABNORMAL HIGH (ref 0.0–149.0)
VLDL: 36.2 mg/dL (ref 0.0–40.0)

## 2015-02-20 LAB — HEMOGLOBIN A1C: Hgb A1c MFr Bld: 8.5 % — ABNORMAL HIGH (ref 4.6–6.5)

## 2015-02-20 MED ORDER — LOVASTATIN 20 MG PO TABS: 20.0000 mg | ORAL_TABLET | Freq: Every day | ORAL | Status: DC

## 2015-02-20 NOTE — Assessment & Plan Note (Signed)
Check a1c. CBGs reasonable usually in 120-140 range in AM and evening. A1c goal 8 given age.

## 2015-02-20 NOTE — Assessment & Plan Note (Signed)
Asymptomatic. Continue aspirin, statin, exercise 3 days a week. Consider updated EKG. Does not want to see cards.

## 2015-02-20 NOTE — Progress Notes (Signed)
[name] Reddish, MD Subjective:   [name] is a 81 y.o. year old very pleasant male patient who presents with:  Diabetes Mellitus-mild poor control, reasonable for age -Fasting sugars  120s up to 140s. Evening around 120 to 140 range. Taking humalog 70/30-sent this instead of 75/25. 24 units in AM 20 before dinner.  ROS- no hypoglycemia, no blurry vision  CAD- asymptomatic, controlled Hyperlipidemia-controlled on last check  Hypertension-excellent control BP Readings from Last 3 Encounters:  02/20/15 110/78  11/20/14 122/54  08/28/14 134/68  still going to gym 3 days a week Lab Results  Component Value Date   LDLCALC 38 12/19/2011   On statin: yes ROS- no chest pain or shortness of breath. No myalgias. No blurry vision, worsening LE edema  Past Medical History- history of prostate cancer and does self cath 3x a day  Medications- reviewed and updated Current Outpatient Prescriptions  Medication Sig Dispense Refill  . ACCU-CHEK SOFTCLIX LANCETS lancets USE  1  LANCET TWICE DAILY 200 each 3  . aspirin (ECOTRIN) 325 MG EC tablet Take 325 mg by mouth daily.      . benazepril (LOTENSIN) 40 MG tablet Take 1 tablet (40 mg total) by mouth daily. 90 tablet 1  . furosemide (LASIX) 40 MG tablet Take 1 tablet (40 mg total) by mouth every morning. 30 tablet 11  . glucose blood (ACCU-CHEK AVIVA) test strip 1 each by Other route 2 (two) times daily. Use as instructed 200 each 3  . hydrochlorothiazide (HYDRODIURIL) 25 MG tablet Take 1 tablet (25 mg total) by mouth daily. 90 tablet 3  . Insulin Lispro Prot & Lispro (HUMALOG 75/25 MIX) (75-25) 100 UNIT/ML Kwikpen Take 24 units each morning with breakfast and 20 units each evening with supper. 20 pen 3  . Insulin Pen Needle (B-D UF III MINI PEN NEEDLES) 31G X 5 MM MISC USE TWICE DAILY. Dx: E11.9 100 each 10  . lovastatin (MEVACOR) 20 MG tablet Take 1 tablet (20 mg total) by mouth at bedtime. 90 tablet 1  . vitamin B-12 (CYANOCOBALAMIN) 100  MCG tablet Take 50 mcg by mouth daily.      . vitamin C (ASCORBIC ACID) 500 MG tablet Take 500 mg by mouth daily.      . [DISCONTINUED] Insulin Syringe-Needle U-100 (B-D INS SYRINGE 0.5CC/31GX5/16) 31G X 5/16" 0.5 ML MISC 1 each by Does not apply route 2 (two) times daily. 100 each 11   No current facility-administered medications for this visit.    Objective: BP 110/78 mmHg  Pulse 73  Temp(Src) 98 F (36.7 C)  Wt 228 lb (103.42 kg) Gen: NAD, resting comfortably in chair, moves to table without difficulty CV: RRR no murmurs rubs or gallops Lungs: CTAB no crackles, wheeze, rhonchi Abdomen: soft/nontender/nondistended/normal bowel sounds. No rebound or guarding.  Ext: no edema Skin: warm, dry, no rash Neuro: grossly normal, moves all extremities   Assessment/Plan:  Diabetes mellitus type II, uncontrolled Check a1c. CBGs reasonable usually in 120-140 range in AM and evening. A1c goal 8 given age.    CAD (coronary artery disease) Asymptomatic. Continue aspirin, statin, exercise 3 days a week. Consider updated EKG. Does not want to see cards.    Hyperlipidemia Controlled in past on lovastatin. Check lipids today. LDL goal <100, would love <70   Essential hypertension Controlled-continue current rx Benazepril 40 qhs, HCTZ 25mg  in day. Lasix 40mg -for ankle swelling, also uses compression stockings without recent worsening in edema.    3-4 months   Orders Placed  This Encounter  Procedures  . Pneumococcal conjugate vaccine 13-valent  . Hemoglobin A1c  . Lipid panel    [place]    Order Specific Question:  Has the patient fasted?    Answer:  No

## 2015-02-20 NOTE — Patient Instructions (Addendum)
Fasting labs before you leave today   Final pneumonia shot today  See me back in 3-4 months

## 2015-02-20 NOTE — Assessment & Plan Note (Signed)
Controlled in past on lovastatin. Check lipids today. LDL goal <100, would love <70

## 2015-02-20 NOTE — Addendum Note (Signed)
Addended by: Clyde Lundborg A on: 02/20/2015 03:50 PM   Modules accepted: Orders

## 2015-02-20 NOTE — Assessment & Plan Note (Signed)
Controlled-continue current rx Benazepril 40 qhs, HCTZ 25mg  in day. Lasix 40mg -for ankle swelling, also uses compression stockings without recent worsening in edema.

## 2015-03-18 DIAGNOSIS — N320 Bladder-neck obstruction: Secondary | ICD-10-CM | POA: Diagnosis not present

## 2015-03-18 DIAGNOSIS — C61 Malignant neoplasm of prostate: Secondary | ICD-10-CM | POA: Diagnosis not present

## 2015-03-18 DIAGNOSIS — R339 Retention of urine, unspecified: Secondary | ICD-10-CM | POA: Diagnosis not present

## 2015-03-18 DIAGNOSIS — N400 Enlarged prostate without lower urinary tract symptoms: Secondary | ICD-10-CM | POA: Diagnosis not present

## 2015-04-03 ENCOUNTER — Other Ambulatory Visit: Payer: Self-pay

## 2015-04-03 MED ORDER — GLUCOSE BLOOD VI STRP: 1.0000 | ORAL_STRIP | Freq: Two times a day (BID) | Status: DC

## 2015-04-06 ENCOUNTER — Telehealth: Payer: Self-pay

## 2015-04-06 MED ORDER — GLUCOSE BLOOD VI STRP
1.0000 | ORAL_STRIP | Freq: Two times a day (BID) | Status: DC
Start: 2015-04-06 — End: 2016-01-28

## 2015-04-06 NOTE — Telephone Encounter (Signed)
[place], [facility] RD: glucose blood (ACCU-CHEK AVIVA) test strip

## 2015-04-06 NOTE — Telephone Encounter (Signed)
Medication refilled

## 2015-04-20 DIAGNOSIS — R339 Retention of urine, unspecified: Secondary | ICD-10-CM | POA: Diagnosis not present

## 2015-04-20 DIAGNOSIS — C61 Malignant neoplasm of prostate: Secondary | ICD-10-CM | POA: Diagnosis not present

## 2015-04-20 DIAGNOSIS — N320 Bladder-neck obstruction: Secondary | ICD-10-CM | POA: Diagnosis not present

## 2015-04-20 DIAGNOSIS — N400 Enlarged prostate without lower urinary tract symptoms: Secondary | ICD-10-CM | POA: Diagnosis not present

## 2015-04-30 ENCOUNTER — Other Ambulatory Visit: Payer: Self-pay

## 2015-04-30 MED ORDER — INSULIN LISPRO PROT & LISPRO (75-25 MIX) 100 UNIT/ML KWIKPEN: PEN_INJECTOR | SUBCUTANEOUS | Status: DC

## 2015-05-22 ENCOUNTER — Other Ambulatory Visit: Payer: Self-pay | Admitting: Family Medicine

## 2015-05-22 MED ORDER — ACCU-CHEK SOFTCLIX LANCETS MISC: Status: DC

## 2015-06-04 ENCOUNTER — Other Ambulatory Visit: Payer: Self-pay | Admitting: Family Medicine

## 2015-06-22 DIAGNOSIS — R339 Retention of urine, unspecified: Secondary | ICD-10-CM | POA: Diagnosis not present

## 2015-06-22 DIAGNOSIS — R32 Unspecified urinary incontinence: Secondary | ICD-10-CM | POA: Diagnosis not present

## 2015-06-22 DIAGNOSIS — N400 Enlarged prostate without lower urinary tract symptoms: Secondary | ICD-10-CM | POA: Diagnosis not present

## 2015-06-22 DIAGNOSIS — N320 Bladder-neck obstruction: Secondary | ICD-10-CM | POA: Diagnosis not present

## 2015-06-22 DIAGNOSIS — C61 Malignant neoplasm of prostate: Secondary | ICD-10-CM | POA: Diagnosis not present

## 2015-06-23 ENCOUNTER — Ambulatory Visit (INDEPENDENT_AMBULATORY_CARE_PROVIDER_SITE_OTHER): Payer: Commercial Managed Care - HMO | Admitting: Family Medicine

## 2015-06-23 ENCOUNTER — Encounter: Payer: Self-pay | Admitting: Family Medicine

## 2015-06-23 VITALS — BP 130/64 | HR 65 | Temp 98.5°F | Wt 227.0 lb

## 2015-06-23 DIAGNOSIS — E1165 Type 2 diabetes mellitus with hyperglycemia: Secondary | ICD-10-CM | POA: Diagnosis not present

## 2015-06-23 DIAGNOSIS — E785 Hyperlipidemia, unspecified: Secondary | ICD-10-CM

## 2015-06-23 DIAGNOSIS — IMO0002 Reserved for concepts with insufficient information to code with codable children: Secondary | ICD-10-CM

## 2015-06-23 DIAGNOSIS — I5032 Chronic diastolic (congestive) heart failure: Secondary | ICD-10-CM | POA: Diagnosis not present

## 2015-06-23 DIAGNOSIS — I5030 Unspecified diastolic (congestive) heart failure: Secondary | ICD-10-CM

## 2015-06-23 DIAGNOSIS — Z23 Encounter for immunization: Secondary | ICD-10-CM

## 2015-06-23 DIAGNOSIS — I5189 Other ill-defined heart diseases: Secondary | ICD-10-CM | POA: Insufficient documentation

## 2015-06-23 HISTORY — DX: Unspecified diastolic (congestive) heart failure: I50.30

## 2015-06-23 LAB — BASIC METABOLIC PANEL
BUN: 33 mg/dL — ABNORMAL HIGH (ref 6–23)
CO2: 27 mEq/L (ref 19–32)
Calcium: 9.4 mg/dL (ref 8.4–10.5)
Chloride: 105 mEq/L (ref 96–112)
Creatinine, Ser: 1.39 mg/dL (ref 0.40–1.50)
GFR: 61.38 mL/min (ref >60.00)
Glucose, Bld: 154 mg/dL — ABNORMAL HIGH (ref 70–99)
Potassium: 4.1 mEq/L (ref 3.5–5.1)
Sodium: 139 mEq/L (ref 135–145)

## 2015-06-23 LAB — HEMOGLOBIN A1C: Hgb A1c MFr Bld: 8.5 % — ABNORMAL HIGH (ref 4.6–6.5)

## 2015-06-23 LAB — LDL CHOLESTEROL, DIRECT: Direct LDL: 105 mg/dL

## 2015-06-23 MED ORDER — LOVASTATIN 40 MG PO TABS: 40.0000 mg | ORAL_TABLET | Freq: Every day | ORAL | Status: DC

## 2015-06-23 MED ORDER — METFORMIN HCL 500 MG PO TABS: 500.0000 mg | ORAL_TABLET | Freq: Every day | ORAL | Status: DC

## 2015-06-23 NOTE — Progress Notes (Signed)
[name], MD  Subjective:  [name] is a 81 y.o. year old very pleasant male patient who presents with:  See problem oriented charting ROS- no chest pain, shortness of breath, nausea, vomiting, headache, dizziness, hypoglycemia  Past Medical History-  Patient Active Problem List   Diagnosis Date Noted  . Diastolic CHF 12/45/8099    Priority: High  . Diabetes mellitus type II, uncontrolled 06/04/2007    Priority: High  . CAD (coronary artery disease) 06/04/2007    Priority: High  . NEOPLASM, MALIGNANT, PROSTATE 08/21/2008    Priority: Medium  . Hyperlipidemia 06/04/2007    Priority: Medium  . Essential hypertension 06/04/2007    Priority: Medium  . Constipation 06/16/2010    Priority: Low  . GERD 08/04/2009    Priority: Low  . Umbilical hernia 83/38/2505    Priority: Low  . Hiatal hernia 02/13/2008    Priority: Low  . DEGENERATIVE DISC DISEASE, CERVICAL SPINE 02/13/2008    Priority: Low   Medications- reviewed and updated Current Outpatient Prescriptions  Medication Sig Dispense Refill  . ACCU-CHEK SOFTCLIX LANCETS lancets USE  1  LANCET TWICE DAILY 200 each 3  . aspirin (ECOTRIN) 325 MG EC tablet Take 325 mg by mouth daily.      . benazepril (LOTENSIN) 40 MG tablet Take 1 tablet (40 mg total) by mouth daily. 90 tablet 1  . furosemide (LASIX) 40 MG tablet Take 1 tablet (40 mg total) by mouth every morning. 30 tablet 11  . glucose blood (ACCU-CHEK AVIVA) test strip 1 each by Other route 2 (two) times daily. Use as instructed 200 each 3  . hydrochlorothiazide (HYDRODIURIL) 25 MG tablet TAKE 1 TABLET EVERY DAY 90 tablet 3  . Insulin Lispro Prot & Lispro (HUMALOG 75/25 MIX) (75-25) 100 UNIT/ML Kwikpen Take 24 units each morning with breakfast and 20 units each evening with supper. 20 pen 3  . Insulin Pen Needle (B-D UF III MINI PEN NEEDLES) 31G X 5 MM MISC USE TWICE DAILY. Dx: E11.9 100 each 10  . lovastatin (MEVACOR) 20 MG tablet Take 1 tablet (20 mg total) by  mouth at bedtime. 90 tablet 1  . vitamin B-12 (CYANOCOBALAMIN) 100 MCG tablet Take 50 mcg by mouth daily.      . vitamin C (ASCORBIC ACID) 500 MG tablet Take 500 mg by mouth daily.      . [DISCONTINUED] Insulin Syringe-Needle U-100 (B-D INS SYRINGE 0.5CC/31GX5/16) 31G X 5/16" 0.5 ML MISC 1 each by Does not apply route 2 (two) times daily. 100 each 11   Objective: BP 130/64 mmHg  Pulse 65  Temp(Src) 98.5 F (36.9 C)  Wt 227 lb (102.967 kg) Gen: NAD, resting comfortably CV: RRR no murmurs rubs or gallops Lungs: CTAB no crackles, wheeze, rhonchi Abdomen: soft/nontender/nondistended/normal bowel sounds. No rebound or guarding.  Ext: trace pitting edema Skin: warm, dry Neuro: grossly normal, moves all extremities, normal gait   Assessment/Plan:  Diabetes mellitus type II, uncontrolled S: poorly controlled. CBG105 this morning. Usually 100s to 120s. 150 highest in AM. Evening up to 150 or 160. Nothing over 200. No sugars under 90. Compliant with  Morning insulin 75/25 increased to 26 units, 20 in evening (unchanged).  Lab Results  Component Value Date   HGBA1C 8.5* 06/23/2015  A/P:Continue current insulin dose. Start low dose metformin at 500mg  daily in the morning to see if can get a1c closer to 8   Diastolic CHF S: compliant with lasix, edema trace stable, no weight gain A/P:  continue lasix, need to discuss diagnosis fully with patient at next visit    Hyperlipidemia S: poorly controlled. Previously controlled, patient states he may have stopped statin but is now compliant A/P: LDL only down 12 points with restart, increase lovastatin to 40mg , may need higher intensity statin given CAD and goal <70   3 months  Orders Placed This Encounter  Procedures  . Flu Vaccine QUAD 36+ mos IM  . Hemoglobin A1c  . LDL cholesterol, direct    [place]  . Basic metabolic panel    [place]    Meds ordered this encounter  Medications  . metFORMIN (GLUCOPHAGE) 500 MG tablet    Sig: Take  1 tablet (500 mg total) by mouth daily with breakfast.    Dispense:  30 tablet    Refill:  5  . lovastatin (MEVACOR) 40 MG tablet    Sig: Take 1 tablet (40 mg total) by mouth at bedtime.    Dispense:  30 tablet    Refill:  5

## 2015-06-23 NOTE — Assessment & Plan Note (Signed)
S: compliant with lasix, edema trace stable, no weight gain A/P: continue lasix, need to discuss diagnosis fully with patient at next visit

## 2015-06-23 NOTE — Assessment & Plan Note (Signed)
S: poorly controlled. CBG105 this morning. Usually 100s to 120s. 150 highest in AM. Evening up to 150 or 160. Nothing over 200. No sugars under 90. Compliant with  Morning insulin 75/25 increased to 26 units, 20 in evening (unchanged).  Lab Results  Component Value Date   HGBA1C 8.5* 06/23/2015  A/P:Continue current insulin dose. Start low dose metformin at 500mg  daily in the morning to see if can get a1c closer to 8

## 2015-06-23 NOTE — Assessment & Plan Note (Signed)
S: poorly controlled. Previously controlled, patient states he may have stopped statin but is now compliant A/P: LDL only down 12 points with restart, increase lovastatin to 40mg , may need higher intensity statin given CAD and goal <70

## 2015-06-23 NOTE — Patient Instructions (Addendum)
     Medication Instructions:  No changes Await a1c  Other Instructions:  Get eye exam scheduled and faxed to [place] at [phone].  Labwork: Before you leave  Testing/Procedures/Immunizations: Received flu shot today. Health Maintenance Due  Topic Date Due  . OPHTHALMOLOGY EXAM - send  06/13/2015   Follow-Up (all visit scheduling, rescheduling, cancellations including labs should be scheduled at front desk): 3-4 months.   Sooner if you need [place] or if you have new or worsening symptoms

## 2015-07-02 ENCOUNTER — Other Ambulatory Visit: Payer: Self-pay | Admitting: *Deleted

## 2015-07-02 MED ORDER — ACCU-CHEK SOFTCLIX LANCETS MISC: Status: DC

## 2015-07-02 NOTE — Telephone Encounter (Signed)
Rx done. 

## 2015-07-30 ENCOUNTER — Other Ambulatory Visit: Payer: Self-pay | Admitting: Family Medicine

## 2015-07-30 NOTE — Telephone Encounter (Signed)
Medication refilled

## 2015-08-05 DIAGNOSIS — H5210 Myopia, unspecified eye: Secondary | ICD-10-CM | POA: Diagnosis not present

## 2015-08-05 DIAGNOSIS — I10 Essential (primary) hypertension: Secondary | ICD-10-CM | POA: Diagnosis not present

## 2015-08-05 DIAGNOSIS — H25813 Combined forms of age-related cataract, bilateral: Secondary | ICD-10-CM | POA: Diagnosis not present

## 2015-08-05 DIAGNOSIS — Z794 Long term (current) use of insulin: Secondary | ICD-10-CM | POA: Diagnosis not present

## 2015-08-05 DIAGNOSIS — H5203 Hypermetropia, bilateral: Secondary | ICD-10-CM | POA: Diagnosis not present

## 2015-08-05 DIAGNOSIS — E109 Type 1 diabetes mellitus without complications: Secondary | ICD-10-CM | POA: Diagnosis not present

## 2015-08-05 DIAGNOSIS — H52223 Regular astigmatism, bilateral: Secondary | ICD-10-CM | POA: Diagnosis not present

## 2015-08-05 DIAGNOSIS — H524 Presbyopia: Secondary | ICD-10-CM | POA: Diagnosis not present

## 2015-08-05 LAB — HM DIABETES EYE EXAM

## 2015-08-10 ENCOUNTER — Encounter: Payer: Self-pay | Admitting: Family Medicine

## 2015-08-11 ENCOUNTER — Telehealth: Payer: Self-pay | Admitting: Family Medicine

## 2015-08-11 DIAGNOSIS — H9190 Unspecified hearing loss, unspecified ear: Secondary | ICD-10-CM

## 2015-08-11 NOTE — Telephone Encounter (Signed)
Pt is requesting a referral to a ENT Dr Lucia Gaskins   [phone]   Reason; Loss of hearing

## 2015-08-11 NOTE — Telephone Encounter (Signed)
Referral placed.

## 2015-08-12 DIAGNOSIS — H9123 Sudden idiopathic hearing loss, bilateral: Secondary | ICD-10-CM | POA: Diagnosis not present

## 2015-08-12 DIAGNOSIS — H6123 Impacted cerumen, bilateral: Secondary | ICD-10-CM | POA: Diagnosis not present

## 2015-08-18 DIAGNOSIS — R339 Retention of urine, unspecified: Secondary | ICD-10-CM | POA: Diagnosis not present

## 2015-08-18 DIAGNOSIS — N320 Bladder-neck obstruction: Secondary | ICD-10-CM | POA: Diagnosis not present

## 2015-08-18 DIAGNOSIS — R32 Unspecified urinary incontinence: Secondary | ICD-10-CM | POA: Diagnosis not present

## 2015-08-18 DIAGNOSIS — C61 Malignant neoplasm of prostate: Secondary | ICD-10-CM | POA: Diagnosis not present

## 2015-08-18 DIAGNOSIS — N400 Enlarged prostate without lower urinary tract symptoms: Secondary | ICD-10-CM | POA: Diagnosis not present

## 2015-09-08 ENCOUNTER — Telehealth: Payer: Self-pay | Admitting: Family Medicine

## 2015-09-08 NOTE — Telephone Encounter (Signed)
Called pt to see if he has the form from [facility] that we need in order to send off for his insulin refill, lm for pt tcb.

## 2015-09-08 NOTE — Telephone Encounter (Signed)
Pt  need a new RX  call in or fax to [place] is Humalog Mix 75/25 Kwils PEN U-100.

## 2015-09-08 NOTE — Telephone Encounter (Signed)
Pt does not have a form from [place] and did not know anything about one. Pt states he is leaving for an appointment if you need to speak with him and will not be back until late afternoon.

## 2015-09-10 NOTE — Telephone Encounter (Signed)
Form faxed to [employer]

## 2015-09-24 ENCOUNTER — Ambulatory Visit (INDEPENDENT_AMBULATORY_CARE_PROVIDER_SITE_OTHER): Payer: Commercial Managed Care - HMO | Admitting: Family Medicine

## 2015-09-24 ENCOUNTER — Encounter: Payer: Self-pay | Admitting: Family Medicine

## 2015-09-24 VITALS — BP 122/60 | HR 72 | Temp 98.1°F | Wt 226.0 lb

## 2015-09-24 DIAGNOSIS — E1165 Type 2 diabetes mellitus with hyperglycemia: Secondary | ICD-10-CM | POA: Diagnosis not present

## 2015-09-24 DIAGNOSIS — Z794 Long term (current) use of insulin: Secondary | ICD-10-CM | POA: Diagnosis not present

## 2015-09-24 DIAGNOSIS — I5032 Chronic diastolic (congestive) heart failure: Secondary | ICD-10-CM | POA: Diagnosis not present

## 2015-09-24 DIAGNOSIS — I2510 Atherosclerotic heart disease of native coronary artery without angina pectoris: Secondary | ICD-10-CM | POA: Diagnosis not present

## 2015-09-24 DIAGNOSIS — C61 Malignant neoplasm of prostate: Secondary | ICD-10-CM | POA: Diagnosis not present

## 2015-09-24 DIAGNOSIS — E785 Hyperlipidemia, unspecified: Secondary | ICD-10-CM

## 2015-09-24 DIAGNOSIS — IMO0001 Reserved for inherently not codable concepts without codable children: Secondary | ICD-10-CM

## 2015-09-24 LAB — HEMOGLOBIN A1C: Hgb A1c MFr Bld: 8.4 % — ABNORMAL HIGH (ref 4.6–6.5)

## 2015-09-24 LAB — BASIC METABOLIC PANEL
BUN: 35 mg/dL — ABNORMAL HIGH (ref 6–23)
CO2: 29 mEq/L (ref 19–32)
Calcium: 9.3 mg/dL (ref 8.4–10.5)
Chloride: 102 mEq/L (ref 96–112)
Creatinine, Ser: 1.52 mg/dL — ABNORMAL HIGH (ref 0.40–1.50)
GFR: 55.33 mL/min — ABNORMAL LOW (ref >60.00)
Glucose, Bld: 166 mg/dL — ABNORMAL HIGH (ref 70–99)
Potassium: 4.8 mEq/L (ref 3.5–5.1)
Sodium: 138 mEq/L (ref 135–145)

## 2015-09-24 LAB — LDL CHOLESTEROL, DIRECT: Direct LDL: 99 mg/dL

## 2015-09-24 NOTE — Assessment & Plan Note (Signed)
S: improved controlled. On Humalog 75/25 (or 70/30) Take 26 units in AM and 20 in PM. Started metformin 500mg  in AM last visit with elevated a1c 2 in a row CBGs- improved with none above 120 in morning, this morning 107. Before dinner usually 140 Exercise and diet- 3x a week at gym  Lab Results  Component Value Date   HGBA1C 8.5* 06/23/2015   HGBA1C 8.5* 02/20/2015   HGBA1C 8.0* 08/21/2014   A/P: check a1c, hopeful for a1c 8 or below given age and comorbidities

## 2015-09-24 NOTE — Patient Instructions (Signed)
Labs before you go  Have yourself a Happy Holiday Season!   No changes today unless labs lead [place] to that

## 2015-09-24 NOTE — Assessment & Plan Note (Signed)
Lovastatin was increased to 40mg . Hopeful LDL at least <100 though really <70 ideal with CAD- will discuss at follow up considering stronger statin

## 2015-09-24 NOTE — Assessment & Plan Note (Signed)
S: diagnosed 2008- no treatment, no pain or other issues. Refused treatment. Self catheter 2-3x a day through [place].  A/P: continue self catheters. No worsening symptoms. If has new symptoms- need to consider metastasis as potential

## 2015-09-24 NOTE — Assessment & Plan Note (Signed)
S: S/p CABG x 6 around 2009. Does not follow with cardiology. No exertional symptoms with exercise A/P: we discussed considering repeat stress test or cardiology follow up. Patient feels like he stresses himself enough through exercise and declines further workup at this time.

## 2015-09-24 NOTE — Progress Notes (Signed)
[name], MD  Subjective:  [name] is a 81 y.o. year old very pleasant male patient who presents for/with See problem oriented charting ROS- No chest pain or shortness of breath. No headache or blurry vision.   Past Medical History-  Patient Active Problem List   Diagnosis Date Noted  . Diastolic CHF ([place]) 123XX123    Priority: High  . Prostate cancer ([place]) 08/21/2008    Priority: High  . Diabetes mellitus type II, uncontrolled ([place]) 06/04/2007    Priority: High  . CAD (coronary artery disease) 06/04/2007    Priority: High  . Hyperlipidemia 06/04/2007    Priority: Medium  . Essential hypertension 06/04/2007    Priority: Medium  . Constipation 06/16/2010    Priority: Low  . GERD 08/04/2009    Priority: Low  . Umbilical hernia 99991111    Priority: Low  . Hiatal hernia 02/13/2008    Priority: Low  . DEGENERATIVE DISC DISEASE, CERVICAL SPINE 02/13/2008    Priority: Low    Medications- reviewed and updated Current Outpatient Prescriptions  Medication Sig Dispense Refill  . ACCU-CHEK SOFTCLIX LANCETS lancets TEST TWO TIMES DAILY 200 each 3  . aspirin (ECOTRIN) 325 MG EC tablet Take 325 mg by mouth daily.      . benazepril (LOTENSIN) 40 MG tablet Take 1 tablet (40 mg total) by mouth daily. 90 tablet 1  . furosemide (LASIX) 40 MG tablet Take 1 tablet (40 mg total) by mouth every morning. 30 tablet 11  . glucose blood (ACCU-CHEK AVIVA) test strip 1 each by Other route 2 (two) times daily. Use as instructed 200 each 3  . hydrochlorothiazide (HYDRODIURIL) 25 MG tablet TAKE 1 TABLET EVERY DAY 90 tablet 3  . Insulin Lispro Prot & Lispro (HUMALOG 75/25 MIX) (75-25) 100 UNIT/ML Kwikpen Take 24 units each morning with breakfast and 20 units each evening with supper. 20 pen 3  . Insulin Pen Needle (B-D UF III MINI PEN NEEDLES) 31G X 5 MM MISC USE TWICE DAILY. Dx: E11.9 100 each 10  . lovastatin (MEVACOR) 40 MG tablet Take 1 tablet (40 mg total) by mouth at bedtime. 30  tablet 5  . metFORMIN (GLUCOPHAGE) 500 MG tablet Take 1 tablet (500 mg total) by mouth daily with breakfast. 30 tablet 5  . vitamin B-12 (CYANOCOBALAMIN) 100 MCG tablet Take 50 mcg by mouth daily.      . vitamin C (ASCORBIC ACID) 500 MG tablet Take 500 mg by mouth daily.      . [DISCONTINUED] Insulin Syringe-Needle U-100 (B-D INS SYRINGE 0.5CC/31GX5/16) 31G X 5/16" 0.5 ML MISC 1 each by Does not apply route 2 (two) times daily. 100 each 11   No current facility-administered medications for this visit.    Objective: BP 122/60 mmHg  Pulse 72  Temp(Src) 98.1 F (36.7 C)  Wt 226 lb (102.513 kg) Gen: NAD, resting comfortably CV: RRR no murmurs rubs or gallops Lungs: CTAB no crackles, wheeze, rhonchi Abdomen: soft/nontender/nondistended/normal bowel sounds. No rebound or guarding.  Ext: trace edema Skin: warm, dry Neuro: grossly normal, moves all extremities  Assessment/Plan:  Prostate cancer ([place]) S: diagnosed 2008- no treatment, no pain or other issues. Refused treatment. Self catheter 2-3x a day through [place].  A/P: continue self catheters. No worsening symptoms. If has new symptoms- need to consider metastasis as potential   Diabetes mellitus type II, uncontrolled ([place]) S: improved controlled. On Humalog 75/25 (or 70/30) Take 26 units in AM and 20 in PM. Started metformin 500mg  in  AM last visit with elevated a1c 2 in a row CBGs- improved with none above 120 in morning, this morning 107. Before dinner usually 140 Exercise and diet- 3x a week at gym  Lab Results  Component Value Date   HGBA1C 8.5* 06/23/2015   HGBA1C 8.5* 02/20/2015   HGBA1C 8.0* 08/21/2014   A/P: check a1c, hopeful for a1c 8 or below given age and comorbidities      CAD (coronary artery disease) S: S/p CABG x 6 around 2009. Does not follow with cardiology. No exertional symptoms with exercise A/P: we discussed considering repeat stress test or cardiology follow up. Patient feels like he stresses himself  enough through exercise and declines further workup at this time.    Diastolic CHF S: 0000000 Echo Grade I diastoilc dysfunction. Requires lasix to prevent edema. Compliant with daily dose of 40mg  A/P: continue current rx of lasix 40mg , we discussed diagnosis of heart failure and patient is aware of signs and symptoms to look out for including shortness of breath and worsening edema  Hyperlipidemia Lovastatin was increased to 40mg . Hopeful LDL at least <100 though really <70 ideal with CAD- will discuss at follow up considering stronger statin  3.5 month follow up Return precautions advised.   Orders Placed This Encounter  Procedures  . Hemoglobin A1c    [place]  . Basic metabolic panel    [place]  . LDL cholesterol, direct    [place]

## 2015-09-24 NOTE — Assessment & Plan Note (Signed)
S: 02/2013 Echo Grade I diastoilc dysfunction. Requires lasix to prevent edema. Compliant with daily dose of 40mg  A/P: continue current rx of lasix 40mg , we discussed diagnosis of heart failure and patient is aware of signs and symptoms to look out for including shortness of breath and worsening edema

## 2015-10-21 DIAGNOSIS — R339 Retention of urine, unspecified: Secondary | ICD-10-CM | POA: Diagnosis not present

## 2015-10-21 DIAGNOSIS — C61 Malignant neoplasm of prostate: Secondary | ICD-10-CM | POA: Diagnosis not present

## 2015-10-21 DIAGNOSIS — N320 Bladder-neck obstruction: Secondary | ICD-10-CM | POA: Diagnosis not present

## 2015-10-21 DIAGNOSIS — N400 Enlarged prostate without lower urinary tract symptoms: Secondary | ICD-10-CM | POA: Diagnosis not present

## 2015-10-27 LAB — HM DIABETES EYE EXAM

## 2015-11-02 ENCOUNTER — Other Ambulatory Visit: Payer: Self-pay | Admitting: Family Medicine

## 2015-11-03 ENCOUNTER — Other Ambulatory Visit: Payer: Self-pay | Admitting: Family Medicine

## 2015-11-16 ENCOUNTER — Encounter: Payer: Self-pay | Admitting: Podiatry

## 2015-11-16 ENCOUNTER — Ambulatory Visit (INDEPENDENT_AMBULATORY_CARE_PROVIDER_SITE_OTHER): Payer: Medicare HMO

## 2015-11-16 ENCOUNTER — Ambulatory Visit (INDEPENDENT_AMBULATORY_CARE_PROVIDER_SITE_OTHER): Payer: Commercial Managed Care - HMO | Admitting: Podiatry

## 2015-11-16 VITALS — BP 118/61 | HR 77 | Resp 18

## 2015-11-16 DIAGNOSIS — R52 Pain, unspecified: Secondary | ICD-10-CM

## 2015-11-16 DIAGNOSIS — M79675 Pain in left toe(s): Secondary | ICD-10-CM

## 2015-11-16 DIAGNOSIS — R609 Edema, unspecified: Secondary | ICD-10-CM

## 2015-11-16 DIAGNOSIS — M79674 Pain in right toe(s): Secondary | ICD-10-CM

## 2015-11-16 DIAGNOSIS — H6123 Impacted cerumen, bilateral: Secondary | ICD-10-CM | POA: Diagnosis not present

## 2015-11-16 DIAGNOSIS — B351 Tinea unguium: Secondary | ICD-10-CM

## 2015-11-16 DIAGNOSIS — H903 Sensorineural hearing loss, bilateral: Secondary | ICD-10-CM | POA: Diagnosis not present

## 2015-11-16 NOTE — Progress Notes (Signed)
   Subjective:    Patient ID: [name], male    DOB: August 18, 1921, 82 y.o.   MRN: [id number]  HPI  82 year old male presents the office today for concerns of swelling to both of his legs which is been ongoing for approximately one year. He was placed on a fluid pill about that time he states that this did not help. He denies any pain to his legs and he denies any injury or trauma. He is diabetic and his last A1c was 8.4. He denies any numbness or tingling. He states that his feet swell more she sits. Symptoms have remained the same over the last year. No other complaints.   Review of Systems  All other systems reviewed and are negative.      Objective:   Physical Exam General: AAO x3, NAD  Dermatological: Nails are hypertrophic, dystrophic, brittle, discolored, elongated 10. No swelling erythema or drainage. There is tenderness in nails 1-5 bilaterally. No open lesions identified or pre-ulcerative lesions at this time.  Vascular: Dorsalis Pedis artery and Posterior Tibial artery pedal pulses are 1/4 bilateral with immedate capillary fill time. There is chronic appearing edema to bilateral lower extremities. There is no Pain with compression, redness, warmth.  Neruologic: Sensation somewhat decreased with Derrel Nip monofilament, vibratory sensation intact   Musculoskeletal: No gross boney pedal deformities bilateral. No pain, crepitus, or limitation noted with foot and ankle range of motion bilateral. Muscular strength 5/5 in all groups tested bilateral.  Gait: Unassisted, Nonantalgic.      Assessment & Plan:   82 year old male bilateral lower xtremity  edema, symptomatic onychomycosis  -X-rays were obtained and reviewed with the patient.  -Treatment options discussed including all alternatives, risks, and complications -Compression stockings. Recommend also follow-up with primary care physician in regards to swelling. No evidence of DVT. Discussed vascular studies however  swelling likely due to congestive heart failure and other medical conditions.  -Nails debris 10 without complications or bleeding  -Daily foot inspection  -Follow-up in 3 months or sooner if any problems arise. In the meantime, encouraged to call the office with any questions, concerns, change in symptoms.   Celesta Gentile, DPM

## 2015-11-17 ENCOUNTER — Encounter: Payer: Self-pay | Admitting: Podiatry

## 2015-11-23 DIAGNOSIS — H903 Sensorineural hearing loss, bilateral: Secondary | ICD-10-CM | POA: Diagnosis not present

## 2015-12-07 ENCOUNTER — Encounter: Payer: Self-pay | Admitting: Podiatry

## 2015-12-07 ENCOUNTER — Ambulatory Visit (INDEPENDENT_AMBULATORY_CARE_PROVIDER_SITE_OTHER): Payer: Commercial Managed Care - HMO | Admitting: Podiatry

## 2015-12-07 VITALS — BP 128/71 | HR 82 | Resp 14

## 2015-12-07 DIAGNOSIS — E1149 Type 2 diabetes mellitus with other diabetic neurological complication: Secondary | ICD-10-CM

## 2015-12-07 DIAGNOSIS — B351 Tinea unguium: Secondary | ICD-10-CM

## 2015-12-07 DIAGNOSIS — L84 Corns and callosities: Secondary | ICD-10-CM

## 2015-12-08 ENCOUNTER — Other Ambulatory Visit: Payer: Self-pay

## 2015-12-08 MED ORDER — INSULIN LISPRO PROT & LISPRO (75-25 MIX) 100 UNIT/ML KWIKPEN: PEN_INJECTOR | SUBCUTANEOUS | Status: DC

## 2015-12-09 ENCOUNTER — Encounter: Payer: Self-pay | Admitting: Podiatry

## 2015-12-09 NOTE — Progress Notes (Signed)
Patient ID: [name], male   DOB: 30-Apr-1921, 82 y.o.   MRN: [id number]  Subjective:  82 year old male presents the office today for follow-up evaluation and concern for pre-ulcerative callus of the right fourth toe. He said this area is doing well but he has noticed a callus overlying the area. Denies any drainage or swelling to the toe. No recent injury or trauma. Denies any systemic complaints such as fevers, chills, nausea, vomiting. No acute changes since last appointment, and no other complaints at this time.   Objective: AAO x3, NAD DP/PT pulses palpable 1/4, CRT less than 3 seconds Protective sensation decreased with Simms Weinstein monofilament Small hyperkeratotic lesion to the fourth toe. Upon agreement there is no underlying ulceration, drainage or other signs of infection. There is no edema, erythema to the toe. No areas of pinpoint bony tenderness or pain with vibratory sensation. MMT 5/5, ROM WNL. No edema, erythema, increase in warmth to bilateral lower extremities.  No open lesions or pre-ulcerative lesions.  No pain with calf compression, swelling, warmth, erythema  Assessment: Pre-ulcerative callus right fourth toe  Plan: -All treatment options discussed with the patient including all alternatives, risks, complications.  -Lesion was debrided without complications or bleeding. Offloading pads were dispensed. Months for any further skin breakdown. -Follow up for routine care. -Patient encouraged to call the office with any questions, concerns, change in symptoms.   Celesta Gentile, DPM

## 2015-12-10 ENCOUNTER — Other Ambulatory Visit: Payer: Self-pay

## 2015-12-10 MED ORDER — INSULIN LISPRO PROT & LISPRO (75-25 MIX) 100 UNIT/ML KWIKPEN: PEN_INJECTOR | SUBCUTANEOUS | Status: DC

## 2015-12-23 ENCOUNTER — Other Ambulatory Visit: Payer: Self-pay

## 2015-12-23 MED ORDER — INSULIN LISPRO PROT & LISPRO (75-25 MIX) 100 UNIT/ML KWIKPEN: PEN_INJECTOR | SUBCUTANEOUS | Status: DC

## 2015-12-25 ENCOUNTER — Other Ambulatory Visit: Payer: Self-pay

## 2015-12-25 ENCOUNTER — Telehealth: Payer: Self-pay | Admitting: Family Medicine

## 2015-12-25 NOTE — Telephone Encounter (Signed)
Yes can switch to novolog 70/30 mix with same instructions

## 2015-12-25 NOTE — Telephone Encounter (Signed)
Ok to switch to novolog?

## 2015-12-25 NOTE — Telephone Encounter (Signed)
Pt request refill of the following: Insulin Lispro Prot & Lispro (HUMALOG 75/25 MIX) (75-25) 100 UNIT/ML Kwikpen  The above med is not covered by the pt pharmacy and is asking if the pt can be switched novolog mix    Pharmacy would like a call back   Phamacy: [phone]

## 2015-12-25 NOTE — Telephone Encounter (Signed)
Called and verified ok to switch to novolog.

## 2015-12-28 DIAGNOSIS — C61 Malignant neoplasm of prostate: Secondary | ICD-10-CM | POA: Diagnosis not present

## 2015-12-31 ENCOUNTER — Telehealth: Payer: Self-pay | Admitting: Family Medicine

## 2015-12-31 NOTE — Telephone Encounter (Signed)
Called patient twice and each time some one pick up phone and disconnected the call without saying anything. I was calling the patient to follow up with his after he walked in the office today frustrated about not be able to get his catheter from [facility].  I have reached out to [employer] three times since and I have reached out the [employer] twice.  [facility] initially informed me that I needed to submit a pre authorization to [facility] in order for patient to get his catheter.  I contacted [employer] to get the form I needed to submit the authorization.  [employer] confirmed receipt of form and stated to allow 2-5 business day for it to be processed.  However, [employer] stated they were missing some information from the form that should come from the [facility].  The [employer] representative asked why we were submitting the authorization instead of [facility] as they are the ones who normally submit it.  I informed the representative that I was only helping due to the patient being upset that he was being given the run around.  She suggested I contact [facility] again and ask them to submit the authorization so that all information needed was provided.  When I reached out to [facility] I was told the reason why the patient supplies were not shipped was because they need they patient to contact [employer] to update the PCP on his card as it was showing at Dr. Arnoldo Morale instead of Dr. Yong Channel.  If patient calls back, will notify him of what [facility] informed me.

## 2016-01-07 DIAGNOSIS — C61 Malignant neoplasm of prostate: Secondary | ICD-10-CM | POA: Diagnosis not present

## 2016-01-07 DIAGNOSIS — N320 Bladder-neck obstruction: Secondary | ICD-10-CM | POA: Diagnosis not present

## 2016-01-07 DIAGNOSIS — R339 Retention of urine, unspecified: Secondary | ICD-10-CM | POA: Diagnosis not present

## 2016-01-07 DIAGNOSIS — R32 Unspecified urinary incontinence: Secondary | ICD-10-CM | POA: Diagnosis not present

## 2016-01-07 DIAGNOSIS — N400 Enlarged prostate without lower urinary tract symptoms: Secondary | ICD-10-CM | POA: Diagnosis not present

## 2016-01-09 ENCOUNTER — Other Ambulatory Visit: Payer: Self-pay | Admitting: Family Medicine

## 2016-01-12 ENCOUNTER — Telehealth: Payer: Self-pay | Admitting: Family Medicine

## 2016-01-12 MED ORDER — INSULIN PEN NEEDLE 32G X 4 MM MISC: Status: DC

## 2016-01-12 NOTE — Telephone Encounter (Signed)
Med is BD PEN NEEDLE NANO U/F 32G X 4 MM MISC

## 2016-01-12 NOTE — Telephone Encounter (Signed)
Pt would like a 90 day supply 2 boxes as a 90 day supply    [employer] [address]   S/w Smitty Cords and she would like a call back   [phone]

## 2016-01-12 NOTE — Telephone Encounter (Signed)
90 day supply of what medication?

## 2016-01-12 NOTE — Telephone Encounter (Signed)
Medication refilled

## 2016-01-28 ENCOUNTER — Other Ambulatory Visit: Payer: Self-pay | Admitting: Family Medicine

## 2016-02-08 ENCOUNTER — Ambulatory Visit: Payer: Commercial Managed Care - HMO | Admitting: Podiatry

## 2016-02-16 DIAGNOSIS — N401 Enlarged prostate with lower urinary tract symptoms: Secondary | ICD-10-CM | POA: Diagnosis not present

## 2016-02-16 DIAGNOSIS — C61 Malignant neoplasm of prostate: Secondary | ICD-10-CM | POA: Diagnosis not present

## 2016-02-16 DIAGNOSIS — N138 Other obstructive and reflux uropathy: Secondary | ICD-10-CM | POA: Diagnosis not present

## 2016-03-27 ENCOUNTER — Emergency Department (HOSPITAL_COMMUNITY): Payer: Commercial Managed Care - HMO

## 2016-03-27 ENCOUNTER — Encounter (HOSPITAL_COMMUNITY): Payer: Self-pay | Admitting: Emergency Medicine

## 2016-03-27 ENCOUNTER — Emergency Department (HOSPITAL_COMMUNITY)
Admission: EM | Admit: 2016-03-27 | Discharge: 2016-03-27 | Disposition: A | Discharge status: Home / Self Care | Payer: Commercial Managed Care - HMO | Attending: Emergency Medicine | Admitting: Emergency Medicine

## 2016-03-27 DIAGNOSIS — Z7982 Long term (current) use of aspirin: Secondary | ICD-10-CM | POA: Insufficient documentation

## 2016-03-27 DIAGNOSIS — E119 Type 2 diabetes mellitus without complications: Secondary | ICD-10-CM | POA: Insufficient documentation

## 2016-03-27 DIAGNOSIS — Z79899 Other long term (current) drug therapy: Secondary | ICD-10-CM | POA: Insufficient documentation

## 2016-03-27 DIAGNOSIS — Z7984 Long term (current) use of oral hypoglycemic drugs: Secondary | ICD-10-CM | POA: Insufficient documentation

## 2016-03-27 DIAGNOSIS — K30 Functional dyspepsia: Secondary | ICD-10-CM | POA: Diagnosis not present

## 2016-03-27 DIAGNOSIS — E785 Hyperlipidemia, unspecified: Secondary | ICD-10-CM | POA: Insufficient documentation

## 2016-03-27 DIAGNOSIS — Z794 Long term (current) use of insulin: Secondary | ICD-10-CM | POA: Diagnosis not present

## 2016-03-27 DIAGNOSIS — Z87891 Personal history of nicotine dependence: Secondary | ICD-10-CM | POA: Insufficient documentation

## 2016-03-27 DIAGNOSIS — R079 Chest pain, unspecified: Secondary | ICD-10-CM | POA: Diagnosis not present

## 2016-03-27 DIAGNOSIS — I2510 Atherosclerotic heart disease of native coronary artery without angina pectoris: Secondary | ICD-10-CM | POA: Insufficient documentation

## 2016-03-27 DIAGNOSIS — K219 Gastro-esophageal reflux disease without esophagitis: Secondary | ICD-10-CM | POA: Diagnosis not present

## 2016-03-27 DIAGNOSIS — Z8546 Personal history of malignant neoplasm of prostate: Secondary | ICD-10-CM | POA: Insufficient documentation

## 2016-03-27 DIAGNOSIS — I10 Essential (primary) hypertension: Secondary | ICD-10-CM | POA: Diagnosis not present

## 2016-03-27 DIAGNOSIS — Z86010 Personal history of colonic polyps: Secondary | ICD-10-CM | POA: Diagnosis not present

## 2016-03-27 DIAGNOSIS — R0789 Other chest pain: Secondary | ICD-10-CM | POA: Diagnosis not present

## 2016-03-27 LAB — COMPREHENSIVE METABOLIC PANEL
ALT: 13 U/L — ABNORMAL LOW (ref 17–63)
AST: 14 U/L — ABNORMAL LOW (ref 15–41)
Albumin: 2.9 g/dL — ABNORMAL LOW (ref 3.5–5.0)
Alkaline Phosphatase: 45 U/L (ref 38–126)
Anion gap: 10 (ref 5–15)
BUN: 23 mg/dL — ABNORMAL HIGH (ref 6–20)
CO2: 22 mmol/L (ref 22–32)
Calcium: 9 mg/dL (ref 8.9–10.3)
Chloride: 104 mmol/L (ref 101–111)
Creatinine, Ser: 1.31 mg/dL — ABNORMAL HIGH (ref 0.61–1.24)
GFR calc Af Amer: 52 mL/min — ABNORMAL LOW (ref >60)
GFR calc non Af Amer: 45 mL/min — ABNORMAL LOW (ref >60)
Glucose, Bld: 158 mg/dL — ABNORMAL HIGH (ref 65–99)
Potassium: 3.7 mmol/L (ref 3.5–5.1)
Sodium: 136 mmol/L (ref 135–145)
Total Bilirubin: 0.3 mg/dL (ref 0.3–1.2)
Total Protein: 6.3 g/dL — ABNORMAL LOW (ref 6.5–8.1)

## 2016-03-27 LAB — CBC WITH DIFFERENTIAL/PLATELET
Basophils Absolute: 0 10*3/uL (ref 0.0–0.1)
Basophils Relative: 0 %
Eosinophils Absolute: 0.1 10*3/uL (ref 0.0–0.7)
Eosinophils Relative: 2 %
HCT: 35.2 % — ABNORMAL LOW (ref 39.0–52.0)
Hemoglobin: 11.3 g/dL — ABNORMAL LOW (ref 13.0–17.0)
Lymphocytes Relative: 27 %
Lymphs Abs: 2.1 10*3/uL (ref 0.7–4.0)
MCH: 30.1 pg (ref 26.0–34.0)
MCHC: 32.1 g/dL (ref 30.0–36.0)
MCV: 93.6 fL (ref 78.0–100.0)
Monocytes Absolute: 0.5 10*3/uL (ref 0.1–1.0)
Monocytes Relative: 6 %
Neutro Abs: 5.2 10*3/uL (ref 1.7–7.7)
Neutrophils Relative %: 65 %
Platelets: 367 10*3/uL (ref 150–400)
RBC: 3.76 MIL/uL — ABNORMAL LOW (ref 4.22–5.81)
RDW: 13.9 % (ref 11.5–15.5)
WBC: 7.9 10*3/uL (ref 4.0–10.5)

## 2016-03-27 LAB — LIPASE, BLOOD: Lipase: 28 U/L (ref 11–51)

## 2016-03-27 LAB — TROPONIN I: Troponin I: <0.03 ng/mL (ref <0.031)

## 2016-03-27 MED ORDER — GI COCKTAIL ~~LOC~~
30.0000 mL | Freq: Once | ORAL | Status: AC
  Administered 2016-03-27: 30 mL via ORAL
  Filled 2016-03-27: qty 30

## 2016-03-27 MED ORDER — PANTOPRAZOLE SODIUM 20 MG PO TBEC
20.0000 mg | DELAYED_RELEASE_TABLET | Freq: Every day | ORAL | Status: DC
Start: 2016-03-27 — End: 2016-04-14

## 2016-03-27 MED ORDER — FAMOTIDINE 20 MG PO TABS
20.0000 mg | ORAL_TABLET | Freq: Once | ORAL | Status: AC
  Administered 2016-03-27: 20 mg via ORAL
  Filled 2016-03-27: qty 1

## 2016-03-27 NOTE — ED Notes (Signed)
Pt to ER by private vehicle with complaint of two week central chest pain described as indigestion however patient reports taking tums without any relief. Hx of bypass surgery. Denies shortness of breath, weakness, dizziness, etc. Pt denies radiation. Reports it is constant. A/o x4. NAD.

## 2016-03-27 NOTE — ED Notes (Signed)
Pt.is hard of hearing.please speak loud and clear to him.tohear you.

## 2016-03-27 NOTE — Discharge Instructions (Signed)
Nonspecific Chest Pain  °Chest pain can be caused by many different conditions. There is always a chance that your pain could be related to something serious, such as a heart attack or a blood clot in your lungs. Chest pain can also be caused by conditions that are not life-threatening. If you have chest pain, it is very important to follow up with your health care provider. °CAUSES  °Chest pain can be caused by: °· Heartburn. °· Pneumonia or bronchitis. °· Anxiety or stress. °· Inflammation around your heart (pericarditis) or lung (pleuritis or pleurisy). °· A blood clot in your lung. °· A collapsed lung (pneumothorax). It can develop suddenly on its own (spontaneous pneumothorax) or from trauma to the chest. °· Shingles infection (varicella-zoster virus). °· Heart attack. °· Damage to the bones, muscles, and cartilage that make up your chest wall. This can include: °· Bruised bones due to injury. °· Strained muscles or cartilage due to frequent or repeated coughing or overwork. °· Fracture to one or more ribs. °· Sore cartilage due to inflammation (costochondritis). °RISK FACTORS  °Risk factors for chest pain may include: °· Activities that increase your risk for trauma or injury to your chest. °· Respiratory infections or conditions that cause frequent coughing. °· Medical conditions or overeating that can cause heartburn. °· Heart disease or family history of heart disease. °· Conditions or health behaviors that increase your risk of developing a blood clot. °· Having had chicken pox (varicella zoster). °SIGNS AND SYMPTOMS °Chest pain can feel like: °· Burning or tingling on the surface of your chest or deep in your chest. °· Crushing, pressure, aching, or squeezing pain. °· Dull or sharp pain that is worse when you move, cough, or take a deep breath. °· Pain that is also felt in your back, neck, shoulder, or arm, or pain that spreads to any of these areas. °Your chest pain may come and go, or it may stay  constant. °DIAGNOSIS °Lab tests or other studies may be needed to find the cause of your pain. Your health care provider may have you take a test called an ambulatory ECG (electrocardiogram). An ECG records your heartbeat patterns at the time the test is performed. You may also have other tests, such as: °· Transthoracic echocardiogram (TTE). During echocardiography, sound waves are used to create a picture of all of the heart structures and to look at how blood flows through your heart. °· Transesophageal echocardiogram (TEE). This is a more advanced imaging test that obtains images from inside your body. It allows your health care provider to see your heart in finer detail. °· Cardiac monitoring. This allows your health care provider to monitor your heart rate and rhythm in real time. °· Holter monitor. This is a portable device that records your heartbeat and can help to diagnose abnormal heartbeats. It allows your health care provider to track your heart activity for several days, if needed. °· Stress tests. These can be done through exercise or by taking medicine that makes your heart beat more quickly. °· Blood tests. °· Imaging tests. °TREATMENT  °Your treatment depends on what is causing your chest pain. Treatment may include: °· Medicines. These may include: °· Acid blockers for heartburn. °· Anti-inflammatory medicine. °· Pain medicine for inflammatory conditions. °· Antibiotic medicine, if an infection is present. °· Medicines to dissolve blood clots. °· Medicines to treat coronary artery disease. °· Supportive care for conditions that do not require medicines. This may include: °· Resting. °· Applying heat   or cold packs to injured areas. °· Limiting activities until pain decreases. °HOME CARE INSTRUCTIONS °· If you were prescribed an antibiotic medicine, finish it all even if you start to feel better. °· Avoid any activities that bring on chest pain. °· Do not use any tobacco products, including  cigarettes, chewing tobacco, or electronic cigarettes. If you need help quitting, ask your health care provider. °· Do not drink alcohol. °· Take medicines only as directed by your health care provider. °· Keep all follow-up visits as directed by your health care provider. This is important. This includes any further testing if your chest pain does not go away. °· If heartburn is the cause for your chest pain, you may be told to keep your head raised (elevated) while sleeping. This reduces the chance that acid will go from your stomach into your esophagus. °· Make lifestyle changes as directed by your health care provider. These may include: °¨ Getting regular exercise. Ask your health care provider to suggest some activities that are safe for you. °¨ Eating a heart-healthy diet. A registered dietitian can help you to learn healthy eating options. °¨ Maintaining a healthy weight. °¨ Managing diabetes, if necessary. °¨ Reducing stress. °SEEK MEDICAL CARE IF: °· Your chest pain does not go away after treatment. °· You have a rash with blisters on your chest. °· You have a fever. °SEEK IMMEDIATE MEDICAL CARE IF:  °· Your chest pain is worse. °· You have an increasing cough, or you cough up blood. °· You have severe abdominal pain. °· You have severe weakness. °· You faint. °· You have chills. °· You have sudden, unexplained chest discomfort. °· You have sudden, unexplained discomfort in your arms, back, neck, or jaw. °· You have shortness of breath at any time. °· You suddenly start to sweat, or your skin gets clammy. °· You feel nauseous or you vomit. °· You suddenly feel light-headed or dizzy. °· Your heart begins to beat quickly, or it feels like it is skipping beats. °These symptoms may represent a serious problem that is an emergency. Do not wait to see if the symptoms will go away. Get medical help right away. Call your local emergency services (911 in the [place] Do not drive yourself to the hospital. °  °This  information is not intended to replace advice given to you by your health care provider. Make sure you discuss any questions you have with your health care provider. °  °Document Released: 07/20/2005 Document Revised: 10/31/2014 Document Reviewed: 05/16/2014 °Elsevier Interactive Patient Education ©2016 [employer] °Suspected Gastroesophageal Reflux Disease, Adult °Normally, food travels down the esophagus and stays in the stomach to be digested. However, when a person has gastroesophageal reflux disease (GERD), food and stomach acid move back up into the esophagus. When this happens, the esophagus becomes sore and inflamed. Over time, GERD can create small holes (ulcers) in the lining of the esophagus.  °CAUSES °This condition is caused by a problem with the muscle between the esophagus and the stomach (lower esophageal sphincter, or LES). Normally, the LES muscle closes after food passes through the esophagus to the stomach. When the LES is weakened or abnormal, it does not close properly, and that allows food and stomach acid to go back up into the esophagus. The LES can be weakened by certain dietary substances, medicines, and medical conditions, including: °· Tobacco use. °· Pregnancy. °· Having a hiatal hernia. °· Heavy alcohol use. °· Certain foods and beverages, such as coffee,   chocolate, onions, and peppermint. °RISK FACTORS °This condition is more likely to develop in: °· People who have an increased body weight. °· People who have connective tissue disorders. °· People who use NSAID medicines. °SYMPTOMS °Symptoms of this condition include: °· Heartburn. °· Difficult or painful swallowing. °· The feeling of having a lump in the throat. °· A bitter taste in the mouth. °· Bad breath. °· Having a large amount of saliva. °· Having an upset or bloated stomach. °· Belching. °· Chest pain. °· Shortness of breath or wheezing. °· Ongoing (chronic) cough or a night-time cough. °· Wearing away of tooth  enamel. °· Weight loss. °Different conditions can cause chest pain. Make sure to see your health care provider if you experience chest pain. °DIAGNOSIS °Your health care provider will take a medical history and perform a physical exam. To determine if you have mild or severe GERD, your health care provider may also monitor how you respond to treatment. You may also have other tests, including: °· An endoscopy to examine your stomach and esophagus with a small camera. °· A test that measures the acidity level in your esophagus. °· A test that measures how much pressure is on your esophagus. °· A barium swallow or modified barium swallow to show the shape, size, and functioning of your esophagus. °TREATMENT °The goal of treatment is to help relieve your symptoms and to prevent complications. Treatment for this condition may vary depending on how severe your symptoms are. Your health care provider may recommend: °· Changes to your diet. °· Medicine. °· Surgery. °HOME CARE INSTRUCTIONS °Diet °· Follow a diet as recommended by your health care provider. This may involve avoiding foods and drinks such as: °¨ Coffee and tea (with or without caffeine). °¨ Drinks that contain alcohol. °¨ Energy drinks and sports drinks. °¨ Carbonated drinks or sodas. °¨ Chocolate and cocoa. °¨ Peppermint and mint flavorings. °¨ Garlic and onions. °¨ Horseradish. °¨ Spicy and acidic foods, including peppers, chili powder, curry powder, vinegar, hot sauces, and barbecue sauce. °¨ Citrus fruit juices and citrus fruits, such as oranges, lemons, and limes. °¨ Tomato-based foods, such as red sauce, chili, salsa, and pizza with red sauce. °¨ Fried and fatty foods, such as donuts, french fries, potato chips, and high-fat dressings. °¨ High-fat meats, such as hot dogs and fatty cuts of red and white meats, such as rib eye steak, sausage, ham, and bacon. °¨ High-fat dairy items, such as whole milk, butter, and cream cheese. °· Eat small, frequent  meals instead of large meals. °· Avoid drinking large amounts of liquid with your meals. °· Avoid eating meals during the 2-3 hours before bedtime. °· Avoid lying down right after you eat. °· Do not exercise right after you eat. ° General Instructions  °· Pay attention to any changes in your symptoms. °· Take over-the-counter and prescription medicines only as told by your health care provider. Do not take aspirin, ibuprofen, or other NSAIDs unless your health care provider told you to do so. °· Do not use any tobacco products, including cigarettes, chewing tobacco, and e-cigarettes. If you need help quitting, ask your health care provider. °· Wear loose-fitting clothing. Do not wear anything tight around your waist that causes pressure on your abdomen. °· Raise (elevate) the head of your bed 6 inches (15cm). °· Try to reduce your stress, such as with yoga or meditation. If you need help reducing stress, ask your health care provider. °· If you are overweight, reduce your weight to an amount that is   healthy for you. Ask your health care provider for guidance about a safe weight loss goal. °· Keep all follow-up visits as told by your health care provider. This is important. °SEEK MEDICAL CARE IF: °· You have new symptoms. °· You have unexplained weight loss. °· You have difficulty swallowing, or it hurts to swallow. °· You have wheezing or a persistent cough. °· Your symptoms do not improve with treatment. °· You have a hoarse voice. °SEEK IMMEDIATE MEDICAL CARE IF: °· You have pain in your arms, neck, jaw, teeth, or back. °· You feel sweaty, dizzy, or light-headed. °· You have chest pain or shortness of breath. °· You vomit and your vomit looks like blood or coffee grounds. °· You faint. °· Your stool is bloody or black. °· You cannot swallow, drink, or eat. °  °This information is not intended to replace advice given to you by your health care provider. Make sure you discuss any questions you have with your health  care provider. °  °Document Released: 07/20/2005 Document Revised: 07/01/2015 Document Reviewed: 02/04/2015 °Elsevier Interactive Patient Education ©2016 [employer] ° °

## 2016-03-27 NOTE — ED Notes (Signed)
Pt reports decrease in chest discomfort after GI cocktail and po pepcid.

## 2016-03-27 NOTE — ED Provider Notes (Signed)
CSN: [id number]     Arrival date & time 03/27/16  [id number] History   First MD Initiated Contact with Patient 03/27/16 0701     Chief Complaint  Patient presents with  . Chest Pain     (Consider location/radiation/quality/duration/timing/severity/associated sxs/prior Treatment) HPI Patient reports he's had "indigestion" for 2 weeks. He reports that he has been taking Tums and it does nothing to help it. He feels the discomfort in the center of his chest. Constant, nothing makes it better or worse. Takes a daily ASA, no regular use of NSAIDS. Also notes diffuse "muscle soreness" in thoracic back. He doesn't feel like the two are associated. Back stiffness worse with twisting motion. Otherwise, no associated symptoms. He reports feeling well. Past Medical History  Diagnosis Date  . Hyperlipidemia   . Hypertension   . Elevated PSA   . CAD (coronary artery disease)   . Diabetes mellitus   . GERD (gastroesophageal reflux disease)   . Hemorrhoids   . Hx of adenomatous colonic polyps   . Arthritis   . CAD (coronary artery disease)     post diaphragjatic wall infarction and subsequent coronary artery bypass graft surgery in 2003, now stable  . Prostate cancer [facility])    Past Surgical History  Procedure Laterality Date  . Coronary artery bypass graft     Family History  Problem Relation Age of Onset  . Heart disease Mother   . Diabetes Mother    Social History  Substance Use Topics  . Smoking status: Former Smoker    Quit date: 10/25/1948  . Smokeless tobacco: Never Used  . Alcohol Use: No    Review of Systems 10 Systems reviewed and are negative for acute change except as noted in the HPI.   Allergies  Review of patient's allergies indicates no known allergies.  Home Medications   Prior to Admission medications   Medication Sig Start Date End Date Taking? Authorizing Provider  ACCU-CHEK SOFTCLIX LANCETS lancets TEST TWO TIMES DAILY 07/30/15   [name], MD  aspirin  (ECOTRIN) 325 MG EC tablet Take 325 mg by mouth daily.      Historical Provider, MD  benazepril (LOTENSIN) 40 MG tablet TAKE 1 TABLET EVERY DAY 01/28/16   [name], MD  furosemide (LASIX) 40 MG tablet Take 1 tablet (40 mg total) by mouth every morning. 12/25/14   [name], MD  glucose blood (ACCU-CHEK AVIVA PLUS) test strip Use to test 2 times daily. Dx: E11.9 01/28/16   [name], MD  hydrochlorothiazide (HYDRODIURIL) 25 MG tablet TAKE 1 TABLET EVERY DAY 06/04/15   [name], MD  Insulin Lispro Prot & Lispro (HUMALOG 75/25 MIX) (75-25) 100 UNIT/ML Kwikpen Take 24 units each morning with breakfast and 20 units each evening with supper. 12/23/15   [name], MD  Insulin Pen Needle (BD PEN NEEDLE NANO U/F) 32G X 4 MM MISC USE TWICE DAILY. DX: E11.9 01/12/16   [name], MD  lovastatin (MEVACOR) 40 MG tablet TAKE 1 TABLET AT BEDTIME 11/03/15   [name], MD  metFORMIN (GLUCOPHAGE) 500 MG tablet TAKE 1 TABLET EVERY DAY WITH BREAKFAST 11/02/15   [name], MD  pantoprazole (PROTONIX) 20 MG tablet Take 1 tablet (20 mg total) by mouth daily. 03/27/16   Charlesetta Shanks, MD  vitamin B-12 (CYANOCOBALAMIN) 100 MCG tablet Take 50 mcg by mouth daily.      Historical Provider, MD  vitamin C (ASCORBIC ACID) 500 MG  tablet Take 500 mg by mouth daily.      Historical Provider, MD   BP 140/75 mmHg  Pulse 69  Temp(Src) 98.7 F (37.1 C) (Oral)  Resp 18  Ht 5\' 10"  (1.778 m)  Wt 215 lb (97.523 kg)  BMI 30.85 kg/m2  SpO2 98% Physical Exam  Constitutional: He is oriented to person, place, and time. He appears well-developed and well-nourished. No distress.  HENT:  Head: Normocephalic and atraumatic.  Eyes: EOM are normal. No scleral icterus.  Neck: Neck supple.  Cardiovascular: Normal rate, regular rhythm, normal heart sounds and intact distal pulses.   Pulmonary/Chest: Effort normal and breath sounds normal.  Abdominal: Soft. Bowel sounds are normal. He exhibits no  distension. There is no tenderness.  Musculoskeletal: Normal range of motion. He exhibits edema. He exhibits no tenderness.  Neurological: He is alert and oriented to person, place, and time. No cranial nerve deficit. He exhibits normal muscle tone. Coordination normal.  Skin: Skin is warm and dry.  Psychiatric: He has a normal mood and affect.    ED Course  Procedures (including critical care time) Labs Review Labs Reviewed  COMPREHENSIVE METABOLIC PANEL - Abnormal; Notable for the following:    Glucose, Bld 158 (*)    BUN 23 (*)    Creatinine, Ser 1.31 (*)    Total Protein 6.3 (*)    Albumin 2.9 (*)    AST 14 (*)    ALT 13 (*)    GFR calc non Af Amer 45 (*)    GFR calc Af Amer 52 (*)    All other components within normal limits  CBC WITH DIFFERENTIAL/PLATELET - Abnormal; Notable for the following:    RBC 3.76 (*)    Hemoglobin 11.3 (*)    HCT 35.2 (*)    All other components within normal limits  LIPASE, BLOOD  TROPONIN I    Imaging Review Dg Chest 2 View  03/27/2016  CLINICAL DATA:  Chest pain ongoing for 2 weeks, mostly central. History of CABG. EXAM: CHEST  2 VIEW COMPARISON:  09/01/2009 FINDINGS: Prior CABG. Left base scarring or atelectasis. Right lung is clear. No effusions. No acute bony abnormality. IMPRESSION: Left base scar or atelectasis.  No acute findings. Electronically Signed   By: Rolm Baptise M.D.   On: 03/27/2016 08:27   I have personally reviewed and evaluated these images and lab results as part of my medical decision-making.   EKG Interpretation   Date/Time:  Sunday March 27 2016 06:55:53 EDT Ventricular Rate:  78 PR Interval:  112 QRS Duration: 134 QT Interval:  402 QTC Calculation: 458 R Axis:   48 Text Interpretation:  Sinus rhythm Ventricular premature complex  Borderline short PR interval Right bundle branch block RBBB new compared  with old. no STEMI Confirmed by Johnney Killian, MD, Jeannie Done [phone]) on 03/27/2016  7:04:21 AM      MDM   Final  diagnoses:  Chest pain, unspecified chest pain type  Indigestion   Patient reports 2 weeks of an indigestion sensation. This has been constant in nature without associated ischemic type symptoms. Patient's vital signs are normal. His cardiac and pulmonary exam are normal. EKG and troponin do not indicate MI. At this time I will initiate treatment for suspected GERD and have the patient follow-up with his family doctor. He is counseled on signs and symptoms were to return.    Charlesetta Shanks, MD 03/27/16 [phone]

## 2016-04-14 ENCOUNTER — Encounter: Payer: Self-pay | Admitting: Family Medicine

## 2016-04-14 ENCOUNTER — Ambulatory Visit (INDEPENDENT_AMBULATORY_CARE_PROVIDER_SITE_OTHER): Payer: Commercial Managed Care - HMO | Admitting: Family Medicine

## 2016-04-14 VITALS — BP 136/72 | HR 74 | Temp 98.2°F | Ht 70.0 in | Wt 220.0 lb

## 2016-04-14 DIAGNOSIS — K219 Gastro-esophageal reflux disease without esophagitis: Secondary | ICD-10-CM | POA: Diagnosis not present

## 2016-04-14 DIAGNOSIS — I10 Essential (primary) hypertension: Secondary | ICD-10-CM

## 2016-04-14 DIAGNOSIS — I5032 Chronic diastolic (congestive) heart failure: Secondary | ICD-10-CM

## 2016-04-14 DIAGNOSIS — I2510 Atherosclerotic heart disease of native coronary artery without angina pectoris: Secondary | ICD-10-CM

## 2016-04-14 MED ORDER — PANTOPRAZOLE SODIUM 20 MG PO TBEC: 20.0000 mg | DELAYED_RELEASE_TABLET | Freq: Every day | ORAL | Status: DC | PRN

## 2016-04-14 NOTE — Assessment & Plan Note (Signed)
GERD related chest pain S: Patient was having epigastric and lower sternal pain for several days leading to ED vsiit. Felt like "severe indigestion". Tums would help some but not fully resolves so went to ER. Within a day on PPI pain was gone. Has taken it for 2 days then stopped and no recurrence of pain- completely asymptomatic outside of reflux feeling including ROS- no SOB, DOE, dizziness, nausea. He states no exertional element whatsoever and not relieved by rest.  A/P: High index of suspicion for GERD related chest pain given complete resolution on PPI and no exertional element. patien tdoes have known CAD and not followed by cardiology- if any recurrence of pain when he is on PPI- consider getting plugged back into cardiology or at least doing stress testing

## 2016-04-14 NOTE — Assessment & Plan Note (Signed)
S: controlled on Benazepril 40 qhs, HCTZ 25mg  in day. Lasix 40mg -for ankle swelling in past- no longer on and BP remains controlled BP Readings from Last 3 Encounters:  04/14/16 136/72  03/27/16 140/75  12/07/15 128/71  A/P:Continue current meds:  Doing well

## 2016-04-14 NOTE — Assessment & Plan Note (Signed)
S: Patient states ran out of lasix several months ago and has not noted a difference other than mild increase in swelling. He has no SOB and he has not seen any weight gain A/P: we will continue to monitor patient off of lasix- counseling given today about return precautions including SOB/DOE/weight gain or worsening swelling precautions

## 2016-04-14 NOTE — Progress Notes (Signed)
Subjective:  [name] is a 82 y.o. year old very pleasant male patient who presents for/with See problem oriented charting ROS- no headache or blurry vision, weight gain.see any ROS included in HPI as well.   Past Medical History-  Patient Active Problem List   Diagnosis Date Noted  . Diastolic CHF ([place]) 123XX123    Priority: High  . Prostate cancer ([place]) 08/21/2008    Priority: High  . Diabetes mellitus type II, uncontrolled ([place]) 06/04/2007    Priority: High  . CAD (coronary artery disease) 06/04/2007    Priority: High  . Hyperlipidemia 06/04/2007    Priority: Medium  . Essential hypertension 06/04/2007    Priority: Medium  . Constipation 06/16/2010    Priority: Low  . GERD 08/04/2009    Priority: Low  . Umbilical hernia [id number]    Priority: Low  . Hiatal hernia 02/13/2008    Priority: Low  . DEGENERATIVE DISC DISEASE, CERVICAL SPINE 02/13/2008    Priority: Low    Medications- reviewed and updated Current Outpatient Prescriptions  Medication Sig Dispense Refill  . ACCU-CHEK SOFTCLIX LANCETS lancets TEST TWO TIMES DAILY 200 each 3  . aspirin (ECOTRIN) 325 MG EC tablet Take 325 mg by mouth daily.      . benazepril (LOTENSIN) 40 MG tablet TAKE 1 TABLET EVERY DAY 90 tablet 3  . furosemide (LASIX) 40 MG tablet Take 1 tablet (40 mg total) by mouth every morning. 30 tablet 11  . glucose blood (ACCU-CHEK AVIVA PLUS) test strip Use to test 2 times daily. Dx: E11.9 200 each 3  . hydrochlorothiazide (HYDRODIURIL) 25 MG tablet TAKE 1 TABLET EVERY DAY 90 tablet 3  . Insulin Lispro Prot & Lispro (HUMALOG 75/25 MIX) (75-25) 100 UNIT/ML Kwikpen Take 24 units each morning with breakfast and 20 units each evening with supper. 20 pen 3  . Insulin Pen Needle (BD PEN NEEDLE NANO U/F) 32G X 4 MM MISC USE TWICE DAILY. DX: E11.9 90 each 11  . lovastatin (MEVACOR) 40 MG tablet TAKE 1 TABLET AT BEDTIME 90 tablet 3  . metFORMIN (GLUCOPHAGE) 500 MG tablet TAKE 1 TABLET EVERY DAY WITH  BREAKFAST 90 tablet 5  . pantoprazole (PROTONIX) 20 MG tablet Take 1 tablet (20 mg total) by mouth daily. 30 tablet 0  . vitamin B-12 (CYANOCOBALAMIN) 100 MCG tablet Take 50 mcg by mouth daily.      . vitamin C (ASCORBIC ACID) 500 MG tablet Take 500 mg by mouth daily.      . [DISCONTINUED] Insulin Syringe-Needle U-100 (B-D INS SYRINGE 0.5CC/31GX5/16) 31G X 5/16" 0.5 ML MISC 1 each by Does not apply route 2 (two) times daily. 100 each 11   No current facility-administered medications for this visit.    Objective: BP 136/72 mmHg  Pulse 74  Temp(Src) 98.2 F (36.8 C) (Oral)  Ht 5\' 10"  (1.778 m)  Wt 220 lb (99.791 kg)  BMI 31.57 kg/m2  SpO2 97% Gen: NAD, resting comfortably on chair, appears much younger than stated age CV: RRR no murmurs rubs or gallops No chest wall pain Lungs: CTAB no crackles, wheeze, rhonchi Abdomen: soft/nontender/nondistended/normal bowel sounds.obese Ext: no edema Skin: warm, dry Neuro: grossly normal, moves all extremities  Assessment/Plan:  Diastolic CHF ([place]) S: Patient states ran out of lasix several months ago and has not noted a difference other than mild increase in swelling. He has no SOB and he has not seen any weight gain A/P: we will continue to monitor patient off of lasix- counseling  given today about return precautions including SOB/DOE/weight gain or worsening swelling precautions  CAD (coronary artery disease) GERD related chest pain S: Patient was having epigastric and lower sternal pain for several days leading to ED vsiit. Felt like "severe indigestion". Tums would help some but not fully resolves so went to ER. Within a day on PPI pain was gone. Has taken it for 2 days then stopped and no recurrence of pain- completely asymptomatic outside of reflux feeling including ROS- no SOB, DOE, dizziness, nausea. He states no exertional element whatsoever and not relieved by rest.  A/P: High index of suspicion for GERD related chest pain given  complete resolution on PPI and no exertional element. patien tdoes have known CAD and not followed by cardiology- if any recurrence of pain when he is on PPI- consider getting plugged back into cardiology or at least doing stress testing   Essential hypertension S: controlled on Benazepril 40 qhs, HCTZ 25mg  in day. Lasix 40mg -for ankle swelling in past- no longer on and BP remains controlled BP Readings from Last 3 Encounters:  04/14/16 136/72  03/27/16 140/75  12/07/15 128/71  A/P:Continue current meds:  Doing well    Advised 4-6 month f/u  Meds ordered this encounter  Medications  . pantoprazole (PROTONIX) 20 MG tablet    Sig: Take 1 tablet (20 mg total) by mouth daily as needed.    Dispense:  30 tablet    Refill:  5    Return precautions advised.  [name], MD

## 2016-04-14 NOTE — Patient Instructions (Addendum)
Continue reflux medicine as needed for reflux symptoms  If you get pain that is not relieved after taking medication or chest pain associated with shortness of breath or with walking then see a physician immediately  May stay off lasix as long as no weight gain or shortness of breath- let me know immediately if that happens

## 2016-04-14 NOTE — Progress Notes (Signed)
Pre visit review using our clinic review tool, if applicable. No additional management support is needed unless otherwise documented below in the visit note. 

## 2016-04-25 ENCOUNTER — Other Ambulatory Visit: Payer: Self-pay

## 2016-04-25 ENCOUNTER — Telehealth: Payer: Self-pay | Admitting: Family Medicine

## 2016-04-25 MED ORDER — HYDROCHLOROTHIAZIDE 25 MG PO TABS: 25.0000 mg | ORAL_TABLET | Freq: Every day | ORAL | Status: DC

## 2016-04-25 NOTE — Telephone Encounter (Signed)
Prescription refill sent to the pharmacy. Called patient and left a voicemail to let him know to pick up med from pharmacy.

## 2016-04-25 NOTE — Telephone Encounter (Signed)
Pt request refill  hydrochlorothiazide (HYDRODIURIL) 25 MG tablet  Pt states his ankles are swollen and dr Arnoldo Morale was the last to refill. [facility]/ [facility]

## 2016-05-05 ENCOUNTER — Encounter: Payer: Self-pay | Admitting: Family Medicine

## 2016-05-05 ENCOUNTER — Ambulatory Visit (INDEPENDENT_AMBULATORY_CARE_PROVIDER_SITE_OTHER): Payer: Commercial Managed Care - HMO | Admitting: Family Medicine

## 2016-05-05 VITALS — BP 146/64 | HR 99 | Temp 100.6°F | Ht 70.0 in | Wt 218.0 lb

## 2016-05-05 DIAGNOSIS — Z8546 Personal history of malignant neoplasm of prostate: Secondary | ICD-10-CM

## 2016-05-05 DIAGNOSIS — R509 Fever, unspecified: Secondary | ICD-10-CM | POA: Diagnosis not present

## 2016-05-05 DIAGNOSIS — Z789 Other specified health status: Secondary | ICD-10-CM

## 2016-05-05 DIAGNOSIS — Z87448 Personal history of other diseases of urinary system: Secondary | ICD-10-CM

## 2016-05-05 MED ORDER — CIPROFLOXACIN HCL 500 MG PO TABS: 500.0000 mg | ORAL_TABLET | Freq: Two times a day (BID) | ORAL | Status: DC

## 2016-05-05 NOTE — Progress Notes (Signed)
Pre visit review using our clinic review tool, if applicable. No additional management support is needed unless otherwise documented below in the visit note. 

## 2016-05-05 NOTE — Progress Notes (Signed)
Subjective:    Patient ID: [name], male    DOB: April 17, 1921, 82 y.o.   MRN: [id number]  HPI  [name] is a 82 year old male who presents today with fever and a Tmax of 100.6 which has been present for 1 day.  He reports that he checked his temperature throughout the night and noted that it would range from 99 to 100 degrees F.  No associated symptoms are reported and appetite is normal. Pertinent history of prostate cancer and TID catheterizations. He states that his urine appeared "darker" a few days ago but notes that has improved.  He denies N/V, flank pain, and suprapubic tenderness. No treatments have been tried at home. He is HOH but is not demonstrating signs of confusion and is able to answer questions without difficulty. No recent antibiotic therapy and he is followed by urology. He reports seeing his urologist one month ago.    Review of Systems  Constitutional: Positive for fever. Negative for chills.  HENT: Negative for congestion, postnasal drip, rhinorrhea and sinus pressure.   Respiratory: Negative for cough.   Cardiovascular: Negative for chest pain and palpitations.  Gastrointestinal: Negative for nausea, vomiting, abdominal pain, diarrhea and constipation.  Genitourinary: Negative for dysuria, urgency and hematuria.       Patient uses a catheter TID and is followed by urology. History of prostate cancer  Musculoskeletal: Negative for myalgias.  Skin: Negative for rash.  Neurological: Negative for dizziness, light-headedness, numbness and headaches.  Psychiatric/Behavioral:       Denies depression or anxious mood. Patient is HOH but is able to provide clear answers when questioned about his symptoms.      Past Medical History  Diagnosis Date  . Hyperlipidemia   . Hypertension   . Elevated PSA   . CAD (coronary artery disease)   . Diabetes mellitus   . GERD (gastroesophageal reflux disease)   . Hemorrhoids   . Hx of adenomatous colonic polyps   .  Arthritis   . CAD (coronary artery disease)     post diaphragjatic wall infarction and subsequent coronary artery bypass graft surgery in 2003, now stable  . Prostate cancer [facility])      Social History   Social History  . Marital Status: Divorced    Spouse Name: N/A  . Number of Children: N/A  . Years of Education: N/A   Occupational History  . Retired [employer]   Social History Main Topics  . Smoking status: Former Smoker    Quit date: 10/25/1948  . Smokeless tobacco: Never Used  . Alcohol Use: No  . Drug Use: No  . Sexual Activity: Not Currently   Other Topics Concern  . Not on file   Social History Narrative   Divorced. 4 children. 7 grandkids. 2 greatgrandkids.       Retired from working at [employer] in [place]in [place]: go to gym 3 days a week, tv and sports    Past Surgical History  Procedure Laterality Date  . Coronary artery bypass graft      Family History  Problem Relation Age of Onset  . Heart disease Mother   . Diabetes Mother     No Known Allergies  Current Outpatient Prescriptions on File Prior to Visit  Medication Sig Dispense Refill  . ACCU-CHEK SOFTCLIX LANCETS lancets TEST TWO TIMES DAILY 200 each 3  . aspirin (ECOTRIN) 325 MG EC tablet Take 325 mg by mouth  daily.      . benazepril (LOTENSIN) 40 MG tablet TAKE 1 TABLET EVERY DAY 90 tablet 3  . glucose blood (ACCU-CHEK AVIVA PLUS) test strip Use to test 2 times daily. Dx: E11.9 200 each 3  . hydrochlorothiazide (HYDRODIURIL) 25 MG tablet Take 1 tablet (25 mg total) by mouth daily. 90 tablet 3  . Insulin Lispro Prot & Lispro (HUMALOG 75/25 MIX) (75-25) 100 UNIT/ML Kwikpen Take 24 units each morning with breakfast and 20 units each evening with supper. 20 pen 3  . Insulin Pen Needle (BD PEN NEEDLE NANO U/F) 32G X 4 MM MISC USE TWICE DAILY. DX: E11.9 90 each 11  . lovastatin (MEVACOR) 40 MG tablet TAKE 1 TABLET AT BEDTIME 90 tablet 3  . metFORMIN (GLUCOPHAGE) 500 MG  tablet TAKE 1 TABLET EVERY DAY WITH BREAKFAST 90 tablet 5  . pantoprazole (PROTONIX) 20 MG tablet Take 1 tablet (20 mg total) by mouth daily as needed. 30 tablet 5  . vitamin B-12 (CYANOCOBALAMIN) 100 MCG tablet Take 50 mcg by mouth daily.      . vitamin C (ASCORBIC ACID) 500 MG tablet Take 500 mg by mouth daily.      . [DISCONTINUED] Insulin Syringe-Needle U-100 (B-D INS SYRINGE 0.5CC/31GX5/16) 31G X 5/16" 0.5 ML MISC 1 each by Does not apply route 2 (two) times daily. 100 each 11   No current facility-administered medications on file prior to visit.    BP 146/64 mmHg  Pulse 99  Temp(Src) 100.6 F (38.1 C) (Oral)  Ht 5\' 10"  (1.778 m)  Wt 218 lb (98.884 kg)  BMI 31.28 kg/m2  SpO2 95%    Objective:   Physical Exam  Constitutional: He is oriented to person, place, and time. He appears well-developed and well-nourished.  Eyes: Pupils are equal, round, and reactive to light. No scleral icterus.  Cardiovascular: Normal rate and regular rhythm.   Pulmonary/Chest: Effort normal and breath sounds normal. He has no wheezes. He has no rales.  Abdominal: Soft. Bowel sounds are normal. He exhibits no distension. There is no tenderness. There is no CVA tenderness.  No suprapubic tenderness present  Neurological: He is alert and oriented to person, place, and time.  Skin: Skin is warm and dry.  Psychiatric: He has a normal mood and affect. His behavior is normal. Judgment and thought content normal.       Assessment & Plan:  1. Fever, unspecified fever cause Suspect UTI secondary to TID catheter use. Unable to obtain a specimen today for a UA/Culture as no catheter supplies are available in clinic. Discussed with patient use of antibiotic therapy and the importance of follow up with his PCP and/or urologist. Also advised patient regarding the risk of urosepsis and that follow up with urology is needed. Patient appears stable and wishes to start antibiotic therapy with the understanding that he  will follow up if symptoms do not improve with treatment or if he develops flank pain, hematuria, N/V, or fever >101. He will start Cipro and contact his urology clinic today for further evaluation and recommended follow up.  - ciprofloxacin (CIPRO) 500 MG tablet; Take 1 tablet (500 mg total) by mouth 2 (two) times daily.  Dispense: 20 tablet; Refill: 0  2. History of prostate cancer Followed by urology  3. History of urinary self-catheterization TID self catheterization reported by patient.  [name] Metz, FNP-C

## 2016-05-05 NOTE — Patient Instructions (Signed)
Please take antibiotic as directed and follow up with your urologist for evaluation. Also, if symptoms do not improve, you develop blood in your urine, or fever >101, please follow up with your provider for further evaluation and treatment.

## 2016-05-06 ENCOUNTER — Telehealth: Payer: Self-pay | Admitting: Family Medicine

## 2016-05-06 NOTE — Telephone Encounter (Signed)
I left a voice message with below information. 

## 2016-05-06 NOTE — Telephone Encounter (Signed)
Advise follow up with urology as UA/Culture could not be obtained.

## 2016-06-10 ENCOUNTER — Other Ambulatory Visit: Payer: Self-pay | Admitting: Family Medicine

## 2016-06-23 ENCOUNTER — Telehealth: Payer: Self-pay

## 2016-06-23 NOTE — Telephone Encounter (Signed)
Provided patient with (1) sample box of Humalog 75-25 to last him until 06/26/16.

## 2016-07-08 ENCOUNTER — Encounter: Payer: Self-pay | Admitting: Family Medicine

## 2016-07-08 ENCOUNTER — Ambulatory Visit (INDEPENDENT_AMBULATORY_CARE_PROVIDER_SITE_OTHER): Payer: Commercial Managed Care - HMO | Admitting: Family Medicine

## 2016-07-08 VITALS — BP 114/56 | HR 81 | Temp 98.0°F | Wt 223.8 lb

## 2016-07-08 DIAGNOSIS — M25571 Pain in right ankle and joints of right foot: Secondary | ICD-10-CM | POA: Diagnosis not present

## 2016-07-08 DIAGNOSIS — Z23 Encounter for immunization: Secondary | ICD-10-CM

## 2016-07-08 DIAGNOSIS — E1165 Type 2 diabetes mellitus with hyperglycemia: Secondary | ICD-10-CM | POA: Diagnosis not present

## 2016-07-08 DIAGNOSIS — IMO0001 Reserved for inherently not codable concepts without codable children: Secondary | ICD-10-CM

## 2016-07-08 DIAGNOSIS — Z794 Long term (current) use of insulin: Secondary | ICD-10-CM | POA: Diagnosis not present

## 2016-07-08 NOTE — Patient Instructions (Signed)
Get x-ray on Monday- do not need appointment  Labs before you leave  Decide on treatment based on labs and x-rays

## 2016-07-08 NOTE — Progress Notes (Signed)
Subjective:  [name] is a 82 y.o. year old very pleasant male patient who presents for/with See problem oriented charting ROS- no fever, chills, redness, warmth near ankle..see any ROS included in HPI as well.   Past Medical History-  Patient Active Problem List   Diagnosis Date Noted  . Diastolic CHF ([place]) 123XX123    Priority: High  . Prostate cancer ([place]) 08/21/2008    Priority: High  . Diabetes mellitus type II, uncontrolled ([place]) 06/04/2007    Priority: High  . CAD (coronary artery disease) 06/04/2007    Priority: High  . Hyperlipidemia 06/04/2007    Priority: Medium  . Essential hypertension 06/04/2007    Priority: Medium  . Constipation 06/16/2010    Priority: Low  . GERD 08/04/2009    Priority: Low  . Umbilical hernia 99991111    Priority: Low  . Hiatal hernia 02/13/2008    Priority: Low  . DEGENERATIVE DISC DISEASE, CERVICAL SPINE 02/13/2008    Priority: Low    Medications- reviewed and updated Current Outpatient Prescriptions  Medication Sig Dispense Refill  . ACCU-CHEK SOFTCLIX LANCETS lancets TEST TWO TIMES DAILY 200 each 3  . aspirin (ECOTRIN) 325 MG EC tablet Take 325 mg by mouth daily.      . benazepril (LOTENSIN) 40 MG tablet TAKE 1 TABLET EVERY DAY 90 tablet 3  . ciprofloxacin (CIPRO) 500 MG tablet Take 1 tablet (500 mg total) by mouth 2 (two) times daily. 20 tablet 0  . glucose blood (ACCU-CHEK AVIVA PLUS) test strip Use to test 2 times daily. Dx: E11.9 200 each 3  . hydrochlorothiazide (HYDRODIURIL) 25 MG tablet TAKE 1 TABLET EVERY DAY 90 tablet 0  . Insulin Lispro Prot & Lispro (HUMALOG 75/25 MIX) (75-25) 100 UNIT/ML Kwikpen Take 24 units each morning with breakfast and 20 units each evening with supper. 20 pen 3  . Insulin Pen Needle (BD PEN NEEDLE NANO U/F) 32G X 4 MM MISC USE TWICE DAILY. DX: E11.9 90 each 11  . lovastatin (MEVACOR) 40 MG tablet TAKE 1 TABLET AT BEDTIME 90 tablet 3  . metFORMIN (GLUCOPHAGE) 500 MG tablet TAKE 1 TABLET  EVERY DAY WITH BREAKFAST 90 tablet 5  . pantoprazole (PROTONIX) 20 MG tablet Take 1 tablet (20 mg total) by mouth daily as needed. 30 tablet 5  . vitamin B-12 (CYANOCOBALAMIN) 100 MCG tablet Take 50 mcg by mouth daily.      . vitamin C (ASCORBIC ACID) 500 MG tablet Take 500 mg by mouth daily.       No current facility-administered medications for this visit.     Objective: BP (!) 114/56 (BP Location: Left Arm, Patient Position: Sitting, Cuff Size: Large)   Pulse 81   Temp 98 F (36.7 C) (Oral)   Wt 223 lb 12.8 oz (101.5 kg)   SpO2 96%   BMI 32.11 kg/m  Gen: NAD, resting comfortably CV: RRR no murmurs rubs or gallops Lungs: CTAB no crackles, wheeze, rhonchi Ext: no edema Skin: warm, dry Neuro: grossly normal, moves all extremities, intact distal sensation, speaks loudly likely due to hearing issues  Right Ankle: 2+ edema right ankle, 1-2+ left- chronic issue.  Range of motion is full in all directions. Strength is 5/5 in all directions but limited by pain.  Stable lateral and medial ligaments Patient with pain throughout medial malleolus. Hurts also with  resisted plantar flexion, toes to left. Pain along posterior tibia Able to walk 4 steps.  Assessment/Plan:  R ankle pain S:About 2 Months  of pain but Worse in September. Moderate aching pain. Gold bond didn't help. Hurts worse to walk. Resting improves pain. Denies fall or injury. Never had pain like this before.  A/P: 82 year old male with progressive right ankle pain without fall or injury. Will get x-rays on Monday- one reason in particular is because of prostate cancer history and ? Mets? Considered nsaids but with his age and GFR around 64- at least wanted updated labs before sending this in.   In additon- needs labs to udpate his a1c, ldl anyway as has been months since being seen for diabetes. Seems to report sugars doing relatively well with sugars120 -130 in evenings. Morning 110- 140  Finally requested flu shot which  was gtiven today.    Orders Placed This Encounter  Procedures  . DG Ankle Complete Right    Standing Status:   Future    Standing Expiration Date:   09/07/2017    Order Specific Question:   Reason for Exam (SYMPTOM  OR DIAGNOSIS REQUIRED)    Answer:   right ankle pain- progressive over months. prostate cancer untreated for years and years- does not follow with urology. ? mets? no fall/injury    Order Specific Question:   Preferred imaging location?    Answer:   [facility]  . Flu vaccine HIGH DOSE PF  . CBC    [place]  . Comprehensive metabolic panel    [place]  . Hemoglobin A1c    [place]  . LDL cholesterol, direct    [facility]   Return precautions advised.  [name], MD

## 2016-07-08 NOTE — Progress Notes (Signed)
Pre visit review using our clinic review tool, if applicable. No additional management support is needed unless otherwise documented below in the visit note. 

## 2016-07-11 ENCOUNTER — Other Ambulatory Visit: Payer: Commercial Managed Care - HMO

## 2016-07-11 ENCOUNTER — Ambulatory Visit (INDEPENDENT_AMBULATORY_CARE_PROVIDER_SITE_OTHER)
Admission: RE | Admit: 2016-07-11 | Discharge: 2016-07-11 | Disposition: A | Discharge status: Home / Self Care | Payer: Commercial Managed Care - HMO | Source: Ambulatory Visit | Attending: Family Medicine | Admitting: Family Medicine

## 2016-07-11 DIAGNOSIS — M19071 Primary osteoarthritis, right ankle and foot: Secondary | ICD-10-CM | POA: Diagnosis not present

## 2016-07-11 DIAGNOSIS — M25571 Pain in right ankle and joints of right foot: Secondary | ICD-10-CM

## 2016-07-12 LAB — COMPREHENSIVE METABOLIC PANEL
ALT: 12 U/L (ref 0–53)
AST: 14 U/L (ref 0–37)
Albumin: 3.7 g/dL (ref 3.5–5.2)
Alkaline Phosphatase: 44 U/L (ref 39–117)
BUN: 30 mg/dL — ABNORMAL HIGH (ref 6–23)
CO2: 27 mEq/L (ref 19–32)
Calcium: 9.4 mg/dL (ref 8.4–10.5)
Chloride: 107 mEq/L (ref 96–112)
Creatinine, Ser: 1.22 mg/dL (ref 0.40–1.50)
GFR: 71.19 mL/min (ref >60.00)
Glucose, Bld: 133 mg/dL — ABNORMAL HIGH (ref 70–99)
Potassium: 4.4 mEq/L (ref 3.5–5.1)
Sodium: 141 mEq/L (ref 135–145)
Total Bilirubin: 0.4 mg/dL (ref 0.2–1.2)
Total Protein: 7.4 g/dL (ref 6.0–8.3)

## 2016-07-12 LAB — CBC
HCT: 39.3 % (ref 39.0–52.0)
Hemoglobin: 12.9 g/dL — ABNORMAL LOW (ref 13.0–17.0)
MCHC: 32.8 g/dL (ref 30.0–36.0)
MCV: 94.1 fl (ref 78.0–100.0)
Platelets: 310 10*3/uL (ref 150.0–400.0)
RBC: 4.17 Mil/uL — ABNORMAL LOW (ref 4.22–5.81)
RDW: 14.5 % (ref 11.5–15.5)
WBC: 7.7 10*3/uL (ref 4.0–10.5)

## 2016-07-12 LAB — LDL CHOLESTEROL, DIRECT: Direct LDL: 66 mg/dL

## 2016-07-12 LAB — HEMOGLOBIN A1C: Hgb A1c MFr Bld: 7.6 % — ABNORMAL HIGH (ref 4.6–6.5)

## 2016-07-13 ENCOUNTER — Other Ambulatory Visit: Payer: Self-pay

## 2016-07-13 DIAGNOSIS — M25572 Pain in left ankle and joints of left foot: Secondary | ICD-10-CM

## 2016-07-13 DIAGNOSIS — M25571 Pain in right ankle and joints of right foot: Secondary | ICD-10-CM

## 2016-07-13 MED ORDER — MELOXICAM 7.5 MG PO TABS: 7.5000 mg | ORAL_TABLET | Freq: Every day | ORAL | 0 refills | Status: DC

## 2016-08-11 ENCOUNTER — Other Ambulatory Visit: Payer: Self-pay | Admitting: Family Medicine

## 2016-08-15 ENCOUNTER — Ambulatory Visit (INDEPENDENT_AMBULATORY_CARE_PROVIDER_SITE_OTHER): Payer: Commercial Managed Care - HMO | Admitting: Orthopaedic Surgery

## 2016-08-15 DIAGNOSIS — M25571 Pain in right ankle and joints of right foot: Secondary | ICD-10-CM

## 2016-08-15 NOTE — Progress Notes (Signed)
Office Visit Note   Patient: [name]           Date of Birth: 07-Jan-1921           MRN: [id number] Visit Date: 08/15/2016              Requested by: [name], MD [address], [place] [id number] PCP: [name], MD   Assessment & Plan: Visit Diagnoses:  1. Pain in right ankle and joints of right foot     Plan:  - xrays reviewed and independently reviewed from canopy - shows DJD of syndesmosis and ankle joint - no acute findings - tylenol prn - ASO brace  - TED hose - f/u prn  Follow-Up Instructions: Return if symptoms worsen or fail to improve.   Orders:  No orders of the defined types were placed in this encounter.  No orders of the defined types were placed in this encounter.     Procedures: No procedures performed   Clinical Data: No additional findings.   Subjective: Chief Complaint  Patient presents with  . Right Ankle - Pain, Leg Swelling    82 yo male 6 month h/o right ankle pain and bilateral ankle swelling.  Has CHF.  +tingling, numbness, swelling, burning, pain is getting worse.  Pain doesn't radiate, worse with ambulation.  Can't take nsaids due to CHF.  Denies trauma or surgeries.      Review of Systems  Constitutional: Negative.   Respiratory: Negative.   Neurological: Negative.   All other systems reviewed and are negative.    Objective: Vital Signs: There were no vitals taken for this visit.  Physical Exam  Right Ankle Exam  Swelling: moderate  Range of Motion  The patient has normal right ankle ROM.  Muscle Strength  The patient has normal right ankle strength. Other  Erythema: absent Scars: absent Sensation: normal Pulse: present   Comments:  2+ pitting edema Pain with squeezing of syndesmosis, pain with ankle ROM      Specialty Comments:  No specialty comments available.  Imaging: No results found.   PMFS History: Patient Active Problem List   Diagnosis Date Noted  .  Pain in right ankle and joints of right foot 08/15/2016  . Diastolic CHF ([place]) 123XX123  . Constipation 06/16/2010  . GERD 08/04/2009  . Prostate cancer ([place]) 08/21/2008  . Umbilical hernia [id number]  . Hiatal hernia 02/13/2008  . DEGENERATIVE DISC DISEASE, CERVICAL SPINE 02/13/2008  . Diabetes mellitus type II, uncontrolled ([place]) 06/04/2007  . Hyperlipidemia 06/04/2007  . Essential hypertension 06/04/2007  . CAD (coronary artery disease) 06/04/2007   Past Medical History:  Diagnosis Date  . Arthritis   . CAD (coronary artery disease)   . CAD (coronary artery disease)    post diaphragjatic wall infarction and subsequent coronary artery bypass graft surgery in 2003, now stable  . Diabetes mellitus   . Elevated PSA   . GERD (gastroesophageal reflux disease)   . Hemorrhoids   . Hx of adenomatous colonic polyps   . Hyperlipidemia   . Hypertension   . Prostate cancer [facility])     Family History  Problem Relation Age of Onset  . Heart disease Mother   . Diabetes Mother     Past Surgical History:  Procedure Laterality Date  . CORONARY ARTERY BYPASS GRAFT     Social History   Occupational History  . Retired [employer]   Social History Main Topics  . Smoking status:  Former Smoker    Quit date: 10/25/1948  . Smokeless tobacco: Never Used  . Alcohol use No  . Drug use: No  . Sexual activity: Not Currently

## 2016-08-29 ENCOUNTER — Telehealth: Payer: Self-pay | Admitting: *Deleted

## 2016-08-29 ENCOUNTER — Other Ambulatory Visit: Payer: Self-pay | Admitting: Family Medicine

## 2016-08-29 NOTE — Telephone Encounter (Signed)
[name], please see message and discuss with Dr.Hunter.

## 2016-08-29 NOTE — Telephone Encounter (Signed)
I thought he used to get through assistance program? I would be ok giving him a few more pens but does not sound like that is a solution until end of the year. He may need a visit to discuss NPH- R dosing instead of more expensive humalog

## 2016-08-29 NOTE — Telephone Encounter (Signed)
Pt presented to office asking for sample of Humalog Mix 75/25 Kwikpen. He said he is in the donut hole and can not afford $250.00 for his insulin. I gave him one pen and told him I will discuss with Dr.Hunter and call him later in the week.   Medication (including dosage): Humalog Mix 75/25 Kwikpen  Lot #: [id number] Expiration: 04/2018  Qty: 1 pen

## 2016-08-30 NOTE — Telephone Encounter (Signed)
Scheduled for Thursday at 11:30.

## 2016-09-01 ENCOUNTER — Ambulatory Visit (INDEPENDENT_AMBULATORY_CARE_PROVIDER_SITE_OTHER): Payer: Commercial Managed Care - HMO | Admitting: Family Medicine

## 2016-09-01 ENCOUNTER — Encounter: Payer: Self-pay | Admitting: Family Medicine

## 2016-09-01 DIAGNOSIS — IMO0001 Reserved for inherently not codable concepts without codable children: Secondary | ICD-10-CM

## 2016-09-01 DIAGNOSIS — Z794 Long term (current) use of insulin: Secondary | ICD-10-CM

## 2016-09-01 DIAGNOSIS — E1165 Type 2 diabetes mellitus with hyperglycemia: Secondary | ICD-10-CM

## 2016-09-01 MED ORDER — "INSULIN SYRINGE-NEEDLE U-100 25G X 5/8"" 1 ML MISC": 5 refills | Status: DC

## 2016-09-01 MED ORDER — INSULIN NPH ISOPHANE & REGULAR (70-30) 100 UNIT/ML ~~LOC~~ SUSP: SUBCUTANEOUS | 11 refills | Status: DC

## 2016-09-01 NOTE — Patient Instructions (Addendum)
Take printed prescription to [facility]. I am going to reduce dose slightly to 24 units in morning and 18 units in evening. Contact me with ANY lows under 80. I am ok with it running higher.   If you need help learning how to draw this up and inject it- we can see you back next week. Sorry we have run out of insulin samples for the 75/25

## 2016-09-01 NOTE — Progress Notes (Signed)
Subjective:  [name] is a 82 y.o. year old very pleasant male patient who presents for/with See problem oriented charting ROS- denies low blood sugars. No chest pain or shortness of breath.see any ROS included in HPI as well.   Past Medical History-  Patient Active Problem List   Diagnosis Date Noted  . Diastolic CHF ([place]) [id number]    Priority: High  . Prostate cancer ([place]) 08/21/2008    Priority: High  . Diabetes mellitus type II, uncontrolled ([place]) 06/04/2007    Priority: High  . CAD (coronary artery disease) 06/04/2007    Priority: High  . Hyperlipidemia 06/04/2007    Priority: Medium  . Essential hypertension 06/04/2007    Priority: Medium  . Constipation 06/16/2010    Priority: Low  . GERD 08/04/2009    Priority: Low  . Umbilical hernia [id number]    Priority: Low  . Hiatal hernia 02/13/2008    Priority: Low  . DEGENERATIVE DISC DISEASE, CERVICAL SPINE 02/13/2008    Priority: Low  . Pain in right ankle and joints of right foot 08/15/2016    Medications- reviewed and updated Current Outpatient Prescriptions  Medication Sig Dispense Refill  . ACCU-CHEK SOFTCLIX LANCETS lancets TEST TWO TIMES DAILY 200 each 3  . aspirin (ECOTRIN) 325 MG EC tablet Take 325 mg by mouth daily.      . benazepril (LOTENSIN) 40 MG tablet TAKE 1 TABLET EVERY DAY 90 tablet 3  . glucose blood (ACCU-CHEK AVIVA PLUS) test strip Use to test 2 times daily. Dx: E11.9 200 each 3  . hydrochlorothiazide (HYDRODIURIL) 25 MG tablet TAKE 1 TABLET EVERY DAY 90 tablet 1  . Insulin Lispro Prot & Lispro (HUMALOG 75/25 MIX) (75-25) 100 UNIT/ML Kwikpen Take 24 units each morning with breakfast and 20 units each evening with supper. 20 pen 3  . Insulin Pen Needle (BD PEN NEEDLE NANO U/F) 32G X 4 MM MISC USE TWICE DAILY. DX: E11.9 90 each 11  . lovastatin (MEVACOR) 40 MG tablet TAKE 1 TABLET AT BEDTIME 90 tablet 3  . meloxicam (MOBIC) 7.5 MG tablet Take 1 tablet (7.5 mg total) by mouth daily. 10 tablet 0   . metFORMIN (GLUCOPHAGE) 500 MG tablet TAKE 1 TABLET EVERY DAY WITH BREAKFAST 90 tablet 5  . pantoprazole (PROTONIX) 20 MG tablet Take 1 tablet (20 mg total) by mouth daily as needed. 30 tablet 5  . vitamin B-12 (CYANOCOBALAMIN) 100 MCG tablet Take 50 mcg by mouth daily.      . vitamin C (ASCORBIC ACID) 500 MG tablet Take 500 mg by mouth daily.      . insulin NPH-regular Human (NOVOLIN 70/30) (70-30) 100 UNIT/ML injection Use as instructed twice a day before meals. As of 09/01/16- take 24 units in morning and 18 units in evening. 10 mL 11  . Insulin Syringe-Needle U-100 25G X 5/8" 1 ML MISC Use twice a day. May substitute as needed for relion insulin 70/30. 100 each 5   No current facility-administered medications for this visit.     Objective: BP 136/72 (BP Location: Left Arm, Patient Position: Sitting, Cuff Size: Large)   Pulse 77   Temp 97.7 F (36.5 C) (Oral)   Wt 226 lb 9.6 oz (102.8 kg)   SpO2 99%   BMI 32.51 kg/m  Hard of hearing  Assessment/Plan:  Diabetes mellitus type II, uncontrolled (HCC) S: reasonable control for age on humalog 75/25 with 24 units in AM and 20 in PM. Fasting sugar this morning 113. States no  highs or lows  We gave him our last remaining humalog75/25 pen the other day. He is in the donut hole and cannot afford $250 per rx. He has enough to get through the weekend.  A/P: Patient required extended counseling today on several levels. 1. Trying to educate him on why we do not have unlimited samples 2. Discussing alternate regimens and differences focusing on cost concerns. Also discussing fact that pens will not be one of more affordable options so will need to switch to syringes. 3. Describing plan of reducing insulin since will be a different version- will do 22 units in AM and 18 in PM (orinally said 24 but patient latera clarified he is only taking 24 at baseline). 4. Discussing risks of hypoglycemia and advising treatment and follow up if occurs  Will  tolerate short term elevations in cbg then can restart humalog 75/25 after out of donut hole  Patient likely will require additional training- advised him to pick up vials and syringes and then can follow up with [place] next week for teaching   The duration of face-to-face time during this visit was greater than 20 minutes. Greater than 50% of this time was spent in counseling as noted above  Meds ordered this encounter  Medications  . insulin NPH-regular Human (NOVOLIN 70/30) (70-30) 100 UNIT/ML injection    Sig: Use as instructed twice a day before meals. As of 09/01/16- take 24 units in morning and 18 units in evening.    Dispense:  10 mL    Refill:  11  . Insulin Syringe-Needle U-100 25G X 5/8" 1 ML MISC    Sig: Use twice a day. May substitute as needed for relion insulin 70/30.    Dispense:  100 each    Refill:  5    Return precautions advised.  [name], MD

## 2016-09-01 NOTE — Progress Notes (Signed)
Pre visit review using our clinic review tool, if applicable. No additional management support is needed unless otherwise documented below in the visit note. 

## 2016-09-01 NOTE — Assessment & Plan Note (Signed)
S: reasonable control for age on humalog 75/25 with 24 units in AM and 20 in PM. Fasting sugar this morning 113. States no highs or lows  We gave him our last remaining humalog75/25 pen the other day. He is in the donut hole and cannot afford $250 per rx. He has enough to get through the weekend.  A/P: Patient required extended counseling today on several levels. 1. Trying to educate him on why we do not have unlimited samples 2. Discussing alternate regimens and differences focusing on cost concerns. Also discussing fact that pens will not be one of more affordable options so will need to switch to syringes. 3. Describing plan of reducing insulin since will be a different version- will do 22 units in AM and 18 in PM (orinally said 24 but patient latera clarified he is only taking 24 at baseline). 4. Discussing risks of hypoglycemia and advising treatment and follow up if occurs  Will tolerate short term elevations in cbg then can restart humalog 75/25 after out of donut hole  Patient likely will require additional training- advised him to pick up vials and syringes and then can follow up with [place] next week for teaching

## 2016-09-22 ENCOUNTER — Other Ambulatory Visit: Payer: Self-pay

## 2016-09-22 MED ORDER — INSULIN LISPRO PROT & LISPRO (75-25 MIX) 100 UNIT/ML KWIKPEN: PEN_INJECTOR | SUBCUTANEOUS | 0 refills | Status: DC

## 2016-10-25 ENCOUNTER — Other Ambulatory Visit: Payer: Self-pay

## 2016-10-25 ENCOUNTER — Telehealth: Payer: Self-pay

## 2016-10-25 ENCOUNTER — Encounter: Payer: Self-pay | Admitting: Family Medicine

## 2016-10-25 DIAGNOSIS — H524 Presbyopia: Secondary | ICD-10-CM | POA: Diagnosis not present

## 2016-10-25 LAB — HM DIABETES EYE EXAM

## 2016-10-25 MED ORDER — INSULIN NPH ISOPHANE & REGULAR (70-30) 100 UNIT/ML ~~LOC~~ SUSP: SUBCUTANEOUS | 11 refills | Status: DC

## 2016-10-25 NOTE — Telephone Encounter (Signed)
Medication Samples have been provided to the patient.  Drug name: Humalog     Strength: 75/25        Qty:   1 [id number]  Exp.Date: 07 2019  Dosing instructions: Take 24 units each morning with breakfast and 20 units each evening with supper.,   The patient has been instructed regarding the correct time, dose, and frequency of taking this medication, including desired effects and most common side effects.   Lonia Mad Southern Hizer 9:42 AM 10/25/2016

## 2016-10-31 ENCOUNTER — Encounter: Payer: Self-pay | Admitting: Family Medicine

## 2016-10-31 ENCOUNTER — Ambulatory Visit (INDEPENDENT_AMBULATORY_CARE_PROVIDER_SITE_OTHER): Payer: Medicare HMO | Admitting: Family Medicine

## 2016-10-31 VITALS — BP 120/62 | HR 86 | Temp 98.1°F | Ht 70.0 in

## 2016-10-31 DIAGNOSIS — J069 Acute upper respiratory infection, unspecified: Secondary | ICD-10-CM

## 2016-10-31 MED ORDER — BENZONATATE 100 MG PO CAPS: 100.0000 mg | ORAL_CAPSULE | Freq: Two times a day (BID) | ORAL | 0 refills | Status: DC | PRN

## 2016-10-31 NOTE — Progress Notes (Signed)
Pre visit review using our clinic review tool, if applicable. No additional management support is needed unless otherwise documented below in the visit note. Patient declines weight measurement today. 

## 2016-10-31 NOTE — Patient Instructions (Addendum)
INSTRUCTIONS FOR UPPER RESPIRATORY INFECTION:  -plenty of rest and adequate fluids  -nasal saline wash 2-3 times daily (use prepackaged nasal saline or bottled/distilled water if making your own)   -can use tylenol (in no history of liver disease)  as directed for aches and sorethroat  -in the winter time, using a humidifier at night is helpful (please follow cleaning instructions)  -if you are taking a cough medication - use only as directed, may also try a teaspoon of honey to coat the throat and throat lozenges.  I sent Tessalon cough medication to the pharmacy for you.  -for sore throat, salt water gargles can help  -follow up if you have fevers, facial pain, tooth pain, difficulty breathing or are worsening or symptoms persist longer then expected or worsen over the next several days.  Upper Respiratory Infection, Adult An upper respiratory infection (URI) is also known as the common cold. It is often caused by a type of germ (virus). Colds are easily spread (contagious). You can pass it to others by kissing, coughing, sneezing, or drinking out of the same glass. Usually, you get better in 1 to 2  weeks.  However, the cough can last for even longer. HOME CARE   Only take medicine as told by your doctor. Follow instructions provided above.  Drink enough water and fluids to keep your pee (urine) clear or pale yellow.  Get plenty of rest.  Return to work when your temperature is < 100 for 24 hours or as told by your doctor. You may use a face mask and wash your hands to stop your cold from spreading. GET HELP RIGHT AWAY IF:   After the first few days, you feel you are getting worse.  You have questions about your medicine.  You have chills, shortness of breath, or red spit (mucus).  You have pain in the face for more then 1-2 days, especially when you bend forward.  You have a fever, puffy (swollen) neck, pain when you swallow, or white spots in the back of your throat.  You  have a bad headache, ear pain, sinus pain, or chest pain.  You have a high-pitched whistling sound when you breathe in and out (wheezing).  You cough up blood.  You have sore muscles or a stiff neck. MAKE SURE YOU:   Understand these instructions.  Will watch your condition.  Will get help right away if you are not doing well or get worse. Document Released: 03/28/2008 Document Revised: 01/02/2012 Document Reviewed: 01/15/2014 [facility] Patient Information 2015 [place], [place]. This information is not intended to replace advice given to you by your health care provider. Make sure you discuss any questions you have with your health care provider.

## 2016-10-31 NOTE — Progress Notes (Signed)
HPI:  URI: -started: a bout 3 days ago -symptoms:nasal congestion - clear, PND, sneezing, itchy nose, mild cough -denies:fever, SOB, NVD, tooth pain, CP, body aches, increased swelling -has tried: nothing -sick contacts/travel/risks: no reported flu, strep or tick exposure  ROS: See pertinent positives and negatives per HPI.  Past Medical History:  Diagnosis Date  . Arthritis   . CAD (coronary artery disease)   . CAD (coronary artery disease)    post diaphragjatic wall infarction and subsequent coronary artery bypass graft surgery in 2003, now stable  . Diabetes mellitus   . Elevated PSA   . GERD (gastroesophageal reflux disease)   . Hemorrhoids   . Hx of adenomatous colonic polyps   . Hyperlipidemia   . Hypertension   . Prostate cancer [facility])     Past Surgical History:  Procedure Laterality Date  . CORONARY ARTERY BYPASS GRAFT      Family History  Problem Relation Age of Onset  . Heart disease Mother   . Diabetes Mother     Social History   Social History  . Marital status: Divorced    Spouse name: N/A  . Number of children: N/A  . Years of education: N/A   Occupational History  . Retired [employer]   Social History Main Topics  . Smoking status: Former Smoker    Quit date: 10/25/1948  . Smokeless tobacco: Never Used  . Alcohol use No  . Drug use: No  . Sexual activity: Not Currently   Other Topics Concern  . None   Social History Narrative   Divorced. 4 children. 7 grandkids. 2 greatgrandkids.       Retired from working at [employer] in [place]: go to gym 3 days a week, tv and sports     Current Outpatient Prescriptions:  .  ACCU-CHEK SOFTCLIX LANCETS lancets, TEST TWO TIMES DAILY, Disp: 200 each, Rfl: 3 .  aspirin (ECOTRIN) 325 MG EC tablet, Take 325 mg by mouth daily.  , Disp: , Rfl:  .  benazepril (LOTENSIN) 40 MG tablet, TAKE 1 TABLET EVERY DAY, Disp: 90 tablet, Rfl: 3 .  glucose blood (ACCU-CHEK AVIVA  PLUS) test strip, Use to test 2 times daily. Dx: E11.9, Disp: 200 each, Rfl: 3 .  hydrochlorothiazide (HYDRODIURIL) 25 MG tablet, TAKE 1 TABLET EVERY DAY, Disp: 90 tablet, Rfl: 1 .  Insulin Lispro Prot & Lispro (HUMALOG 75/25 MIX) (75-25) 100 UNIT/ML Kwikpen, Take 24 units each morning with breakfast and 20 units each evening with supper., Disp: 5 pen, Rfl: 0 .  insulin NPH-regular Human (NOVOLIN 70/30) (70-30) 100 UNIT/ML injection, Use as instructed twice a day before meals. As of 09/01/16- take 24 units in morning and 18 units in evening., Disp: 10 mL, Rfl: 11 .  Insulin Pen Needle (BD PEN NEEDLE NANO U/F) 32G X 4 MM MISC, USE TWICE DAILY. DX: E11.9, Disp: 90 each, Rfl: 11 .  Insulin Syringe-Needle U-100 25G X 5/8" 1 ML MISC, Use twice a day. May substitute as needed for relion insulin 70/30., Disp: 100 each, Rfl: 5 .  lovastatin (MEVACOR) 40 MG tablet, TAKE 1 TABLET AT BEDTIME, Disp: 90 tablet, Rfl: 3 .  meloxicam (MOBIC) 7.5 MG tablet, Take 1 tablet (7.5 mg total) by mouth daily., Disp: 10 tablet, Rfl: 0 .  metFORMIN (GLUCOPHAGE) 500 MG tablet, TAKE 1 TABLET EVERY DAY WITH BREAKFAST, Disp: 90 tablet, Rfl: 5 .  pantoprazole (PROTONIX) 20 MG tablet, Take 1  tablet (20 mg total) by mouth daily as needed., Disp: 30 tablet, Rfl: 5 .  vitamin B-12 (CYANOCOBALAMIN) 100 MCG tablet, Take 50 mcg by mouth daily.  , Disp: , Rfl:  .  vitamin C (ASCORBIC ACID) 500 MG tablet, Take 500 mg by mouth daily.  , Disp: , Rfl:  .  benzonatate (TESSALON) 100 MG capsule, Take 1 capsule (100 mg total) by mouth 2 (two) times daily as needed for cough., Disp: 20 capsule, Rfl: 0  EXAM:  Vitals:   10/31/16 0959  BP: 120/62  Pulse: 86  Temp: 98.1 F (36.7 C)    There is no height or weight on file to calculate BMI.  GENERAL: vitals reviewed and listed above, alert, oriented, appears well hydrated and in no acute distress  HEENT: atraumatic, conjunttiva clear, no obvious abnormalities on inspection of external nose  and ears, normal appearance of ear canals and TMs, clear nasal congestion, mild post oropharyngeal erythema with PND, no tonsillar edema or exudate, no sinus TTP  NECK: no obvious masses on inspection  LUNGS: clear to auscultation bilaterally, no wheezes, rales or rhonchi, good air movement  CV: HRRR  MS: moves all extremities without noticeable abnormality  PSYCH: pleasant and cooperative, no obvious depression or anxiety  ASSESSMENT AND PLAN:  Discussed the following assessment and plan:  Acute upper respiratory infection  -given HPI and exam findings today, a serious infection or illness is unlikely. We discussed potential etiologies, with VURI being most likely, and advised supportive care and monitoring. Tessalon Rx for cough. Strict return precautions if worsening or lingering longer then expected given his age and comorbidities. We discussed treatment side effects, likely course, antibiotic misuse, transmission, and signs of developing a serious illness. -of course, we advised to return or notify a doctor immediately if symptoms worsen or persist or new concerns arise.    Patient Instructions  INSTRUCTIONS FOR UPPER RESPIRATORY INFECTION:  -plenty of rest and adequate fluids  -nasal saline wash 2-3 times daily (use prepackaged nasal saline or bottled/distilled water if making your own)   -can use tylenol (in no history of liver disease)  as directed for aches and sorethroat  -in the winter time, using a humidifier at night is helpful (please follow cleaning instructions)  -if you are taking a cough medication - use only as directed, may also try a teaspoon of honey to coat the throat and throat lozenges.  I sent Tessalon cough medication to the pharmacy for you.  -for sore throat, salt water gargles can help  -follow up if you have fevers, facial pain, tooth pain, difficulty breathing or are worsening or symptoms persist longer then expected or worsen over the next  several days.  Upper Respiratory Infection, Adult An upper respiratory infection (URI) is also known as the common cold. It is often caused by a type of germ (virus). Colds are easily spread (contagious). You can pass it to others by kissing, coughing, sneezing, or drinking out of the same glass. Usually, you get better in 1 to 2  weeks.  However, the cough can last for even longer. HOME CARE   Only take medicine as told by your doctor. Follow instructions provided above.  Drink enough water and fluids to keep your pee (urine) clear or pale yellow.  Get plenty of rest.  Return to work when your temperature is < 100 for 24 hours or as told by your doctor. You may use a face mask and wash your hands to stop your  cold from spreading. GET HELP RIGHT AWAY IF:   After the first few days, you feel you are getting worse.  You have questions about your medicine.  You have chills, shortness of breath, or red spit (mucus).  You have pain in the face for more then 1-2 days, especially when you bend forward.  You have a fever, puffy (swollen) neck, pain when you swallow, or white spots in the back of your throat.  You have a bad headache, ear pain, sinus pain, or chest pain.  You have a high-pitched whistling sound when you breathe in and out (wheezing).  You cough up blood.  You have sore muscles or a stiff neck. MAKE SURE YOU:   Understand these instructions.  Will watch your condition.  Will get help right away if you are not doing well or get worse. Document Released: 03/28/2008 Document Revised: 01/02/2012 Document Reviewed: 01/15/2014 [facility] Patient Information 2015 [place], [place]. This information is not intended to replace advice given to you by your health care provider. Make sure you discuss any questions you have with your health care provider.    Colin Benton R., DO

## 2016-11-01 ENCOUNTER — Encounter: Payer: Self-pay | Admitting: Family Medicine

## 2016-11-05 ENCOUNTER — Other Ambulatory Visit: Payer: Self-pay | Admitting: Family Medicine

## 2016-12-06 ENCOUNTER — Telehealth: Payer: Self-pay

## 2016-12-06 NOTE — Telephone Encounter (Signed)
error 

## 2016-12-06 NOTE — Telephone Encounter (Signed)
You may enter referral. I would make sure patient aware this may be costly and considering cannot afford insulin I am concerned about this.

## 2016-12-06 NOTE — Telephone Encounter (Signed)
Received a call from Dr. Mickie Hillier office stating that [name] needs a referral to either [facility] or [place] for cochlear implants. They stated no one here in [place] would be able to do them.  Please advise

## 2016-12-08 ENCOUNTER — Other Ambulatory Visit: Payer: Self-pay

## 2016-12-08 DIAGNOSIS — H9193 Unspecified hearing loss, bilateral: Secondary | ICD-10-CM

## 2016-12-08 NOTE — Telephone Encounter (Signed)
Referral placed for ENT

## 2016-12-15 ENCOUNTER — Other Ambulatory Visit: Payer: Self-pay | Admitting: Family Medicine

## 2016-12-21 ENCOUNTER — Other Ambulatory Visit: Payer: Self-pay

## 2016-12-21 DIAGNOSIS — H9193 Unspecified hearing loss, bilateral: Secondary | ICD-10-CM

## 2016-12-22 ENCOUNTER — Other Ambulatory Visit: Payer: Self-pay | Admitting: Family Medicine

## 2017-01-02 ENCOUNTER — Other Ambulatory Visit: Payer: Self-pay

## 2017-01-02 MED ORDER — LOVASTATIN 40 MG PO TABS: 40.0000 mg | ORAL_TABLET | Freq: Every day | ORAL | 3 refills | Status: DC

## 2017-01-05 ENCOUNTER — Other Ambulatory Visit: Payer: Self-pay

## 2017-01-05 MED ORDER — INSULIN PEN NEEDLE 32G X 4 MM MISC: 11 refills | Status: AC

## 2017-01-20 DIAGNOSIS — Z87891 Personal history of nicotine dependence: Secondary | ICD-10-CM | POA: Diagnosis not present

## 2017-01-20 DIAGNOSIS — H903 Sensorineural hearing loss, bilateral: Secondary | ICD-10-CM | POA: Diagnosis not present

## 2017-01-20 DIAGNOSIS — C61 Malignant neoplasm of prostate: Secondary | ICD-10-CM | POA: Diagnosis not present

## 2017-01-21 ENCOUNTER — Other Ambulatory Visit: Payer: Self-pay | Admitting: Family Medicine

## 2017-02-06 DIAGNOSIS — H903 Sensorineural hearing loss, bilateral: Secondary | ICD-10-CM | POA: Diagnosis not present

## 2017-03-13 ENCOUNTER — Other Ambulatory Visit: Payer: Self-pay | Admitting: Family Medicine

## 2017-03-13 DIAGNOSIS — M25571 Pain in right ankle and joints of right foot: Secondary | ICD-10-CM

## 2017-03-16 ENCOUNTER — Ambulatory Visit (INDEPENDENT_AMBULATORY_CARE_PROVIDER_SITE_OTHER): Payer: Medicare HMO

## 2017-03-16 VITALS — BP 132/60 | HR 78 | Ht 70.0 in | Wt 227.0 lb

## 2017-03-16 DIAGNOSIS — Z0000 Encounter for general adult medical examination without abnormal findings: Secondary | ICD-10-CM | POA: Diagnosis not present

## 2017-03-16 NOTE — Progress Notes (Signed)
I have reviewed and agree with note, evaluation, plan. Look forward to seeing him next week. Hope to complete foot exam at that time.   [name], MD

## 2017-03-16 NOTE — Patient Instructions (Addendum)
[name] , Thank you for taking time to come for your Medicare Wellness Visit. I appreciate your ongoing commitment to your health goals. Please review the following plan we discussed and let me know if I can assist you in the future.   Please go back to the gym, it will be good for you!!!  And help you remain independent   You need to take the TDAP (tetanus with pertussis) You can take this at the pharmacy. If you get a dirty cut; you can take it in the office and it will not cost you anything  Please make apt with Dr. Yong [name] 0  Good luck with your hearing implant  Please work on you Advanced Directives; You can take this to a bank and sign it in front of 2 witnesses and a notary and you will not need to pay.   These are the goals we discussed: Goals    . patient          Go back to the gym;  Would recommend you continue to go. It will help you stay strong and help her diabetes        This is a list of the screening recommended for you and due dates:  Health Maintenance  Topic Date Due  . Tetanus Vaccine  10/25/2015  . Complete foot exam   11/21/2015  . Hemoglobin A1C  01/08/2017  . Flu Shot  05/24/2017  . Eye exam for diabetics  10/25/2017  . Pneumonia vaccines  Completed      Health Maintenance, Male A healthy lifestyle and preventive care is important for your health and wellness. Ask your health care provider about what schedule of regular examinations is right for you. What should I know about weight and diet?  Eat a Healthy Diet  Eat plenty of vegetables, fruits, whole grains, low-fat dairy products, and lean protein.  Do not eat a lot of foods high in solid fats, added sugars, or salt. Maintain a Healthy Weight  Regular exercise can help you achieve or maintain a healthy weight. You should:  Do at least 150 minutes of exercise each week. The exercise should increase your heart rate and make you sweat (moderate-intensity exercise).  Do strength-training  exercises at least twice a week. Watch Your Levels of Cholesterol and Blood Lipids  Have your blood tested for lipids and cholesterol every 5 years starting at 81 years of age. If you are at high risk for heart disease, you should start having your blood tested when you are 81 years old. You may need to have your cholesterol levels checked more often if:  Your lipid or cholesterol levels are high.  You are older than 81 years of age.  You are at high risk for heart disease. What should I know about cancer screening? Many types of cancers can be detected early and may often be prevented. Lung Cancer  You should be screened every year for lung cancer if:  You are a current smoker who has smoked for at least 30 years.  You are a former smoker who has quit within the past 15 years.  Talk to your health care provider about your screening options, when you should start screening, and how often you should be screened. Colorectal Cancer  Routine colorectal cancer screening usually begins at 81 years of age and should be repeated every 5-10 years until you are 81 years old. You may need to be screened more often if early forms of precancerous  polyps or small growths are found. Your health care provider may recommend screening at an earlier age if you have risk factors for colon cancer.  Your health care provider may recommend using home test kits to check for hidden blood in the stool.  A small camera at the end of a tube can be used to examine your colon (sigmoidoscopy or colonoscopy). This checks for the earliest forms of colorectal cancer. Prostate and Testicular Cancer  Depending on your age and overall health, your health care provider may do certain tests to screen for prostate and testicular cancer.  Talk to your health care provider about any symptoms or concerns you have about testicular or prostate cancer. Skin Cancer  Check your skin from head to toe regularly.  Tell your  health care provider about any new moles or changes in moles, especially if:  There is a change in a mole's size, shape, or color.  You have a mole that is larger than a pencil eraser.  Always use sunscreen. Apply sunscreen liberally and repeat throughout the day.  Protect yourself by wearing long sleeves, pants, a wide-brimmed hat, and sunglasses when outside. What should I know about heart disease, diabetes, and high blood pressure?  If you are 13-41 years of age, have your blood pressure checked every 3-5 years. If you are 64 years of age or older, have your blood pressure checked every year. You should have your blood pressure measured twice-once when you are at a hospital or clinic, and once when you are not at a hospital or clinic. Record the average of the two measurements. To check your blood pressure when you are not at a hospital or clinic, you can use:  An automated blood pressure machine at a pharmacy.  A home blood pressure monitor.  Talk to your health care provider about your target blood pressure.  If you are between 73-11 years old, ask your health care provider if you should take aspirin to prevent heart disease.  Have regular diabetes screenings by checking your fasting blood sugar level.  If you are at a normal weight and have a low risk for diabetes, have this test once every three years after the age of 71.  If you are overweight and have a high risk for diabetes, consider being tested at a younger age or more often.  A one-time screening for abdominal aortic aneurysm (AAA) by ultrasound is recommended for men aged 42-75 years who are current or former smokers. What should I know about preventing infection? Hepatitis B  If you have a higher risk for hepatitis B, you should be screened for this virus. Talk with your health care provider to find out if you are at risk for hepatitis B infection. Hepatitis C  Blood testing is recommended for:  Everyone born from  [birth year] through 1963.  Anyone with known risk factors for hepatitis C. Sexually Transmitted Diseases (STDs)  You should be screened each year for STDs including gonorrhea and chlamydia if:  You are sexually active and are younger than 81 years of age.  You are older than 81 years of age and your health care provider tells you that you are at risk for this type of infection.  Your sexual activity has changed since you were last screened and you are at an increased risk for chlamydia or gonorrhea. Ask your health care provider if you are at risk.  Talk with your health care provider about whether you are at high risk of  being infected with HIV. Your health care provider may recommend a prescription medicine to help prevent HIV infection. What else can I do?  Schedule regular health, dental, and eye exams.  Stay current with your vaccines (immunizations).  Do not use any tobacco products, such as cigarettes, chewing tobacco, and e-cigarettes. If you need help quitting, ask your health care provider.  Limit alcohol intake to no more than 2 drinks per day. One drink equals 12 ounces of beer, 5 ounces of wine, or 1 ounces of hard liquor.  Do not use street drugs.  Do not share needles.  Ask your health care provider for help if you need support or information about quitting drugs.  Tell your health care provider if you often feel depressed.  Tell your health care provider if you have ever been abused or do not feel safe at home. This information is not intended to replace advice given to you by your health care provider. Make sure you discuss any questions you have with your health care provider. Document Released: 04/07/2008 Document Revised: 06/08/2016 Document Reviewed: 07/14/2015 Elsevier Interactive Patient Education  2017 [place] Prevention in the Home Falls can cause injuries and can affect people from all age groups. There are many simple things that you can do to  make your home safe and to help prevent falls. What can I do on the outside of my home?  Regularly repair the edges of walkways and driveways and fix any cracks.  Remove high doorway thresholds.  Trim any shrubbery on the main path into your home.  Use bright outdoor lighting.  Clear walkways of debris and clutter, including tools and rocks.  Regularly check that handrails are securely fastened and in good repair. Both sides of any steps should have handrails.  Install guardrails along the edges of any raised decks or porches.  Have leaves, snow, and ice cleared regularly.  Use sand or salt on walkways during winter months.  In the garage, clean up any spills right away, including grease or oil spills. What can I do in the bathroom?  Use night lights.  Install grab bars by the toilet and in the tub and shower. Do not use towel bars as grab bars.  Use non-skid mats or decals on the floor of the tub or shower.  If you need to sit down while you are in the shower, use a plastic, non-slip stool.  Keep the floor dry. Immediately clean up any water that spills on the floor.  Remove soap buildup in the tub or shower on a regular basis.  Attach bath mats securely with double-sided non-slip rug tape.  Remove throw rugs and other tripping hazards from the floor. What can I do in the bedroom?  Use night lights.  Make sure that a bedside light is easy to reach.  Do not use oversized bedding that drapes onto the floor.  Have a firm chair that has side arms to use for getting dressed.  Remove throw rugs and other tripping hazards from the floor. What can I do in the kitchen?  Clean up any spills right away.  Avoid walking on wet floors.  Place frequently used items in easy-to-reach places.  If you need to reach for something above you, use a sturdy step stool that has a grab bar.  Keep electrical cables out of the way.  Do not use floor polish or wax that makes floors  slippery. If you have to use wax,  make sure that it is non-skid floor wax.  Remove throw rugs and other tripping hazards from the floor. What can I do in the stairways?  Do not leave any items on the stairs.  Make sure that there are handrails on both sides of the stairs. Fix handrails that are broken or loose. Make sure that handrails are as long as the stairways.  Check any carpeting to make sure that it is firmly attached to the stairs. Fix any carpet that is loose or worn.  Avoid having throw rugs at the top or bottom of stairways, or secure the rugs with carpet tape to prevent them from moving.  Make sure that you have a light switch at the top of the stairs and the bottom of the stairs. If you do not have them, have them installed. What are some other fall prevention tips?  Wear closed-toe shoes that fit well and support your feet. Wear shoes that have rubber soles or low heels.  When you use a stepladder, make sure that it is completely opened and that the sides are firmly locked. Have someone hold the ladder while you are using it. Do not climb a closed stepladder.  Add color or contrast paint or tape to grab bars and handrails in your home. Place contrasting color strips on the first and last steps.  Use mobility aids as needed, such as canes, walkers, scooters, and crutches.  Turn on lights if it is dark. Replace any light bulbs that burn out.  Set up furniture so that there are clear paths. Keep the furniture in the same spot.  Fix any uneven floor surfaces.  Choose a carpet design that does not hide the edge of steps of a stairway.  Be aware of any and all pets.  Review your medicines with your healthcare provider. Some medicines can cause dizziness or changes in blood pressure, which increase your risk of falling. Talk with your health care provider about other ways that you can decrease your risk of falls. This may include working with a physical therapist or trainer  to improve your strength, balance, and endurance. This information is not intended to replace advice given to you by your health care provider. Make sure you discuss any questions you have with your health care provider. Document Released: 09/30/2002 Document Revised: 03/08/2016 Document Reviewed: 11/14/2014 Elsevier Interactive Patient Education  2017 [employer].

## 2017-03-16 NOTE — Progress Notes (Signed)
Subjective:   [name] is a 83 y.o. male who presents for Medicare Annual/Subsequent preventive examination.  The Patient was informed that the wellness visit is to identify future health risk and educate and initiate measures that can reduce risk for increased disease through the lifespan.    NO ROS; Medicare Wellness Visit Last OV w Yong Channel; 08/2016  Describes health as good, fair or great?  Ok;  Modification to assessment was made due to inability to hear. Questions and communication had to be written.  Per the patient; Dr. Yong Channel has referred to [facility] and he is being evaluated for a cochlear implant. He is very excited about this  61 but still appears mentally sharp and engaged in care   Preventive Screening -Counseling & Management   Smoking history; states does not smoke  Appears to have remote smoking hx  Second Hand Smoke status; No Smokers in the home ETOH no  Lives with son in son's home; He is autistic Has 2 children in [place], one child in [place] and one in [place]   Medication adherence or issues?  Takes 24 if Humalog + in the am and 18 at hs BS 138 this am   RISK FACTORS Diet He cooks; states he goes out occasionally. States he cares for himself and health; taking insulin and eats 3 times a day;   Regular exercise  Used to go to the gym. Stopped because he is getting old. Recommended he go back to the gym to assist him with maintaining his health as well keeping his diabetes in good control. Agrees to go back and will start slow.   Cardiac Risk Factors:  Advanced aged > 65 in men; >65 in women Hyperlipidemia - neg; LDL 105;   Diabetes - managed on insulin Overweight; recommended he continue at the gym   Fall risk - due to age but no falls;  Actually his 'get up and go" is good;  States he takes no antiinflammatory meds  Given education on "Fall Prevention in the Home" for more safety tips the patient can apply as appropriate.  Long term goal is to  "age in place" with son (who is Autistic)   Mobility of Functional changes this year? States he is slowing down but quit the gym because he is "getting old"  But agreed to go back. Wasn't hurting or had no other reason to quit   Safety; on level home  Mental Health:  Any emotional problems? Anxious, depressed, irritable, sad or blue? No  Denies feeling depressed or hopeless; voices pleasure in daily life How many social activities have you been engaged in within the last 2 weeks? No   Hearing Screening Comments: In process of being evaluated by [facility] for hearing implant Vision Screening Comments: See well  Followed by Dr. Newman Nip Wears glasses    Activities of Daily Living - See functional screen   Cognitive testing; Ad8 score; 0 or less than 2  MMSE deferred or completed if AD8 + 2 issues  Advanced Directives  States he did not have this completed  Agreed to receipt of information and discussion.  Focused face to face x  20 minutes discussing HCPOA and Living will and reviewed all the questions in the [place] forms. The patient voices understanding of HCPOA; LW reviewed and information provided on each question. Educated on how to revoke this HCPOA or LW at any time.   Also  discussed life prolonging measures (given a few examples) and  where he could choose to initiate or not;  the ability to given the HCPOA power to change his living will or not if he cannot speak for himself; as well as finalizing the will by 2 unrelated witnesses and notary.  Will call for questions and given information on [facility] pastoral department for further assistance.   Spent time discussing witnesses and notary; To bring to Dr. Yong Channel at his visit next week;    Patient Care Team: [name], MD as PCP - General (Family Medicine)  Immunization History  Administered Date(s) Administered  . Influenza Whole 07/27/2007, 08/27/2009, 06/14/2010  . Influenza, High Dose Seasonal PF 07/08/2016   . Influenza,inj,Quad PF,36+ Mos 06/23/2015  . Influenza-Unspecified 07/10/2013, 07/17/2014  . Pneumococcal Conjugate-13 02/20/2015  . Pneumococcal Polysaccharide-23 10/24/2005  . Td 10/24/2005   Required Immunizations needed today  Screening test up to date or reviewed for plan of completion Health Maintenance Due  Topic Date Due  . TETANUS/TDAP  10/25/2015  . FOOT EXAM  11/21/2015  . HEMOGLOBIN A1C  01/08/2017   Discussed tetanus and left him written information  Deferred foot exam due to [facility] issues and time A1c- made apt to fup with Dr. Yong Channel  Cardiac Risk Factors include: diabetes mellitus     Objective:    Vitals: BP 132/60   Pulse 78   Ht 5\' 10"  (1.778 m)   Wt 227 lb (103 kg)   SpO2 94%   BMI 32.57 kg/m   Body mass index is 32.57 kg/m.  Tobacco History  Smoking Status  . Former Smoker  . Quit date: 10/25/1948  Smokeless Tobacco  . Never Used     Counseling given: Yes   Past Medical History:  Diagnosis Date  . Arthritis   . CAD (coronary artery disease)   . CAD (coronary artery disease)    post diaphragjatic wall infarction and subsequent coronary artery bypass graft surgery in 2003, now stable  . Diabetes mellitus   . Elevated PSA   . GERD (gastroesophageal reflux disease)   . Hemorrhoids   . Hx of adenomatous colonic polyps   . Hyperlipidemia   . Hypertension   . Prostate cancer [facility])    Past Surgical History:  Procedure Laterality Date  . CORONARY ARTERY BYPASS GRAFT     Family History  Problem Relation Age of Onset  . Heart disease Mother   . Diabetes Mother    History  Sexual Activity  . Sexual activity: Not Currently    Outpatient Encounter Prescriptions as of 03/16/2017  Medication Sig  . ACCU-CHEK SOFTCLIX LANCETS lancets TEST TWO TIMES DAILY  . aspirin (ECOTRIN) 325 MG EC tablet Take 325 mg by mouth daily.    . benazepril (LOTENSIN) 40 MG tablet TAKE 1 TABLET EVERY DAY  . hydrochlorothiazide (HYDRODIURIL) 25 MG tablet TAKE 1  TABLET EVERY DAY  . Insulin Lispro Prot & Lispro (HUMALOG 75/25 MIX) (75-25) 100 UNIT/ML Kwikpen Take 24 units each morning with breakfast and 20 units each evening with supper.  . insulin NPH-regular Human (NOVOLIN 70/30) (70-30) 100 UNIT/ML injection Use as instructed twice a day before meals. As of 09/01/16- take 24 units in morning and 18 units in evening.  . Insulin Pen Needle (BD PEN NEEDLE NANO U/F) 32G X 4 MM MISC USE TWICE DAILY. DX: E11.9  . Insulin Syringe-Needle U-100 25G X 5/8" 1 ML MISC Use twice a day. May substitute as needed for relion insulin 70/30.  Marland Kitchen lovastatin (MEVACOR) 40 MG tablet Take  1 tablet (40 mg total) by mouth at bedtime.  . metFORMIN (GLUCOPHAGE) 500 MG tablet TAKE 1 TABLET EVERY DAY WITH BREAKFAST  . vitamin B-12 (CYANOCOBALAMIN) 100 MCG tablet Take 50 mcg by mouth daily.    . vitamin C (ASCORBIC ACID) 500 MG tablet Take 500 mg by mouth daily.    Marland Kitchen ACCU-CHEK AVIVA PLUS test strip USE TO TEST 2 TIMES DAILY (Patient not taking: Reported on 03/16/2017)  . benzonatate (TESSALON) 100 MG capsule Take 1 capsule (100 mg total) by mouth 2 (two) times daily as needed for cough. (Patient not taking: Reported on 03/16/2017)  . meloxicam (MOBIC) 7.5 MG tablet TAKE 1 TABLET (7.5 MG TOTAL) BY MOUTH DAILY. (Patient not taking: Reported on 03/16/2017)  . pantoprazole (PROTONIX) 20 MG tablet Take 1 tablet (20 mg total) by mouth daily as needed. (Patient not taking: Reported on 03/16/2017)   No facility-administered encounter medications on file as of 03/16/2017.     Activities of Daily Living In your present state of health, do you have any difficulty performing the following activities: 03/16/2017  Hearing? Y  Vision? N  Difficulty concentrating or making decisions? N  Walking or climbing stairs? N  Dressing or bathing? N  Doing errands, shopping? N  Preparing Food and eating ? N  Using the Toilet? N  In the past six months, have you accidently leaked urine? N  Do you have  problems with loss of bowel control? N  Managing your Medications? N  Managing your Finances? N  Housekeeping or managing your Housekeeping? N  Some recent data might be hidden    Patient Care Team: [name], MD as PCP - General (Family Medicine)   Assessment:     Exercise Activities and Dietary recommendations    Goals    . patient          Go back to the gym;  Would recommend you continue to go. It will help you stay strong and help her diabetes       Fall Risk Fall Risk  03/16/2017 02/20/2015 12/13/2013 02/15/2013  Falls in the past year? No No No No   Depression Screen PHQ 2/9 Scores 03/16/2017 02/20/2015 12/13/2013 02/15/2013  PHQ - 2 Score 0 0 0 1    Cognitive Function        Immunization History  Administered Date(s) Administered  . Influenza Whole 07/27/2007, 08/27/2009, 06/14/2010  . Influenza, High Dose Seasonal PF 07/08/2016  . Influenza,inj,Quad PF,36+ Mos 06/23/2015  . Influenza-Unspecified 07/10/2013, 07/17/2014  . Pneumococcal Conjugate-13 02/20/2015  . Pneumococcal Polysaccharide-23 10/24/2005  . Td 10/24/2005   Screening Tests Health Maintenance  Topic Date Due  . TETANUS/TDAP  10/25/2015  . FOOT EXAM  11/21/2015  . HEMOGLOBIN A1C  01/08/2017  . INFLUENZA VACCINE  05/24/2017  . OPHTHALMOLOGY EXAM  10/25/2017  . PNA vac Low Risk Adult  Completed      Plan:     PCP Notes  Health Maintenance Educated regarding tetanus; can take at pharmacy or come in if he sustains cut or laceration.  Deferred foot exam due to time and writing down all communication Stated he is awaiting CT and fup at [facility] for hearing implant  A1c is due; made apt to fup with Dr. Yong Channel next Thursday at 10am due to there being a note regarding lowering insulin due to doughnut last year.   Spent time discussing Advanced Directive and he will fup   Abnormal Screens  Hearing but is being assessed  Referrals  none  Patient concerns; very cooperative;   Nurse  Concerns; overall, seems to be managing    Next PCP apt 05/31 at 10am fup A1c and insulin    I have personally reviewed and noted the following in the patient's chart:   . Medical and social history . Use of alcohol, tobacco or illicit drugs  . Current medications and supplements . Functional ability and status . Nutritional status . Physical activity . Advanced directives . List of other physicians . Hospitalizations, surgeries, and ER visits in previous 12 months . Vitals . Screenings to include cognitive, depression, and falls . Referrals and appointments  In addition, I have reviewed and discussed with patient certain preventive protocols, quality metrics, and best practice recommendations. A written personalized care plan for preventive services as well as general preventive health recommendations were provided to patient.     Wynetta Fines, RN  03/16/2017

## 2017-03-23 ENCOUNTER — Ambulatory Visit (INDEPENDENT_AMBULATORY_CARE_PROVIDER_SITE_OTHER): Payer: Medicare HMO | Admitting: Family Medicine

## 2017-03-23 ENCOUNTER — Encounter: Payer: Self-pay | Admitting: Family Medicine

## 2017-03-23 VITALS — BP 130/72 | HR 71 | Temp 98.1°F | Ht 70.0 in | Wt 229.6 lb

## 2017-03-23 DIAGNOSIS — B351 Tinea unguium: Secondary | ICD-10-CM | POA: Diagnosis not present

## 2017-03-23 DIAGNOSIS — E1165 Type 2 diabetes mellitus with hyperglycemia: Secondary | ICD-10-CM

## 2017-03-23 DIAGNOSIS — E785 Hyperlipidemia, unspecified: Secondary | ICD-10-CM | POA: Diagnosis not present

## 2017-03-23 DIAGNOSIS — I5032 Chronic diastolic (congestive) heart failure: Secondary | ICD-10-CM

## 2017-03-23 DIAGNOSIS — I10 Essential (primary) hypertension: Secondary | ICD-10-CM

## 2017-03-23 DIAGNOSIS — IMO0001 Reserved for inherently not codable concepts without codable children: Secondary | ICD-10-CM

## 2017-03-23 DIAGNOSIS — I2510 Atherosclerotic heart disease of native coronary artery without angina pectoris: Secondary | ICD-10-CM

## 2017-03-23 DIAGNOSIS — Z794 Long term (current) use of insulin: Secondary | ICD-10-CM

## 2017-03-23 DIAGNOSIS — C61 Malignant neoplasm of prostate: Secondary | ICD-10-CM

## 2017-03-23 LAB — COMPREHENSIVE METABOLIC PANEL
ALT: 9 U/L (ref 0–53)
AST: 14 U/L (ref 0–37)
Albumin: 3.9 g/dL (ref 3.5–5.2)
Alkaline Phosphatase: 42 U/L (ref 39–117)
BUN: 25 mg/dL — ABNORMAL HIGH (ref 6–23)
CO2: 29 mEq/L (ref 19–32)
Calcium: 9.5 mg/dL (ref 8.4–10.5)
Chloride: 105 mEq/L (ref 96–112)
Creatinine, Ser: 1.27 mg/dL (ref 0.40–1.50)
GFR: 67.86 mL/min (ref >60.00)
Glucose, Bld: 141 mg/dL — ABNORMAL HIGH (ref 70–99)
Potassium: 4.8 mEq/L (ref 3.5–5.1)
Sodium: 139 mEq/L (ref 135–145)
Total Bilirubin: 0.4 mg/dL (ref 0.2–1.2)
Total Protein: 6.9 g/dL (ref 6.0–8.3)

## 2017-03-23 LAB — CBC
HCT: 38.5 % — ABNORMAL LOW (ref 39.0–52.0)
Hemoglobin: 12.7 g/dL — ABNORMAL LOW (ref 13.0–17.0)
MCHC: 33 g/dL (ref 30.0–36.0)
MCV: 95.1 fl (ref 78.0–100.0)
Platelets: 277 10*3/uL (ref 150.0–400.0)
RBC: 4.04 Mil/uL — ABNORMAL LOW (ref 4.22–5.81)
RDW: 14.4 % (ref 11.5–15.5)
WBC: 7.2 10*3/uL (ref 4.0–10.5)

## 2017-03-23 LAB — LDL CHOLESTEROL, DIRECT: Direct LDL: 72 mg/dL

## 2017-03-23 LAB — HEMOGLOBIN A1C: Hgb A1c MFr Bld: 8.5 % — ABNORMAL HIGH (ref 4.6–6.5)

## 2017-03-23 NOTE — Assessment & Plan Note (Signed)
Has 2-4 hour surgery for cochlear implant coming up. Get plugged back into cardiology for their opinion on updating stress test. He has previously declined this but agrees in light of surgery. He is able to complete 4 METS without chest pain or shortness of breath fortunately.

## 2017-03-23 NOTE — Assessment & Plan Note (Signed)
S: controlled on benazepril 40mg , hctz 25mg . Still with some edema ASCVD 10 year risk calculation if age 81-79: elevated but already on statin to reduce risk BP Readings from Last 3 Encounters:  03/23/17 130/72  03/16/17 132/60  10/31/16 120/62  A/P: We discussed blood pressure goal of <140/90 due to diabetes. Continue current meds

## 2017-03-23 NOTE — Assessment & Plan Note (Signed)
S: reasonably controlled last visit for age On humalong 75/25 24 units in Am and 20 in PM. also on metformin 500mg  daily Fasting sugar this morning was 113. Denies highs or lows.   We did novolin 70/30 22 units AM, 18  Units in PM short term while in donut hole. He is up to 24 units in AM and 18 units in the PM- much cheaper with [employer]  Lab Results  Component Value Date   HGBA1C 7.6 (H) 07/11/2016   HGBA1C 8.4 (H) 09/24/2015   HGBA1C 8.5 (H) 06/23/2015   A/P: update a1c- hopeful at least below 8

## 2017-03-23 NOTE — Assessment & Plan Note (Signed)
Edema noted but stable. No shortness of breath. Has ben off lasix since about a year ago and Continue to monitor- weight up about 10 lbs in that time frame.

## 2017-03-23 NOTE — Progress Notes (Addendum)
Subjective:  [name] is a 83 y.o. year old very pleasant male patient who presents for/with See problem oriented charting ROS- No chest pain or shortness of breath. No headache or blurry vision. Very hard of hearing- planning on cochlear implant   Past Medical History-  Patient Active Problem List   Diagnosis Date Noted  . Diastolic CHF ([place]) 98/92/1194    Priority: High  . Prostate cancer ([place]) 08/21/2008    Priority: High  . Diabetes mellitus type II, uncontrolled ([place]) 06/04/2007    Priority: High  . CAD (coronary artery disease) 06/04/2007    Priority: High  . Hyperlipidemia 06/04/2007    Priority: Medium  . Essential hypertension 06/04/2007    Priority: Medium  . Constipation 06/16/2010    Priority: Low  . GERD 08/04/2009    Priority: Low  . Umbilical hernia 17/40/8144    Priority: Low  . Hiatal hernia 02/13/2008    Priority: Low  . DEGENERATIVE DISC DISEASE, CERVICAL SPINE 02/13/2008    Priority: Low  . Pain in right ankle and joints of right foot 08/15/2016    Medications- reviewed and updated Current Outpatient Prescriptions  Medication Sig Dispense Refill  . ACCU-CHEK AVIVA PLUS test strip USE TO TEST 2 TIMES DAILY 180 each 3  . ACCU-CHEK SOFTCLIX LANCETS lancets TEST TWO TIMES DAILY 200 each 3  . aspirin (ECOTRIN) 325 MG EC tablet Take 325 mg by mouth daily.      . benazepril (LOTENSIN) 40 MG tablet TAKE 1 TABLET EVERY DAY 90 tablet 3  . hydrochlorothiazide (HYDRODIURIL) 25 MG tablet TAKE 1 TABLET EVERY DAY 90 tablet 3  . Insulin Lispro Prot & Lispro (HUMALOG 75/25 MIX) (75-25) 100 UNIT/ML Kwikpen Take 24 units each morning with breakfast and 20 units each evening with supper. 5 pen 0  . insulin NPH-regular Human (NOVOLIN 70/30) (70-30) 100 UNIT/ML injection Use as instructed twice a day before meals. As of 09/01/16- take 24 units in morning and 18 units in evening. 10 mL 11  . Insulin Pen Needle (BD PEN NEEDLE NANO U/F) 32G X 4 MM MISC USE TWICE DAILY.  DX: E11.9 90 each 11  . Insulin Syringe-Needle U-100 25G X 5/8" 1 ML MISC Use twice a day. May substitute as needed for relion insulin 70/30. 100 each 5  . lovastatin (MEVACOR) 40 MG tablet Take 1 tablet (40 mg total) by mouth at bedtime. 90 tablet 3  . metFORMIN (GLUCOPHAGE) 500 MG tablet TAKE 1 TABLET EVERY DAY WITH BREAKFAST 90 tablet 3  . vitamin B-12 (CYANOCOBALAMIN) 100 MCG tablet Take 50 mcg by mouth daily.      . vitamin C (ASCORBIC ACID) 500 MG tablet Take 500 mg by mouth daily.       No current facility-administered medications for this visit.     Objective: BP 130/72 (BP Location: Left Arm, Patient Position: Sitting, Cuff Size: Large)   Pulse 71   Temp 98.1 F (36.7 C) (Oral)   Ht 5\' 10"  (1.778 m)   Wt 229 lb 9.6 oz (104.1 kg)   SpO2 96%   BMI 32.94 kg/m  Gen: NAD, resting comfortably Have to write most of conversation due to patient being hard of hearing CV: RRR no murmurs rubs or gallops Lungs: CTAB no crackles, wheeze, rhonchi Abdomen: soft/nontender/nondistended/normal bowel sounds. No rebound or guarding.  Ext: 2+  Edema stable Skin: warm, dry  Diabetic Foot Exam - Simple   Simple Foot Form Diabetic Foot exam was performed with the following  findings:  Yes 03/23/2017 10:57 AM  Visual Inspection No deformities, no ulcerations, no other skin breakdown bilaterally:  Yes Sensation Testing See comments:  Yes Pulse Check Posterior Tibialis and Dorsalis pulse intact bilaterally:  Yes Comments 1+ pulses only. onychomycosis long nails No sensation in toes but otherwise normal monofilament    Assessment/Plan: Needs help trimming nails due to onychomycosis- will refer to podiatry  Diabetes mellitus type II, uncontrolled ([place]) S: reasonably controlled last visit for age On humalong 75/25 24 units in Am and 20 in PM. also on metformin 500mg  daily Fasting sugar this morning was 113. Denies highs or lows.   We did novolin 70/30 22 units AM, 18  Units in PM short term  while in donut hole. He is up to 24 units in AM and 18 units in the PM- much cheaper with [employer]  Lab Results  Component Value Date   HGBA1C 7.6 (H) 07/11/2016   HGBA1C 8.4 (H) 09/24/2015   HGBA1C 8.5 (H) 06/23/2015   A/P: update a1c- hopeful at least below 8  Hyperlipidemia S: reasonably controlled in September 2017 with LDL 66 on lovastatin 50mg . No myalgias.   A/P: continue current meds- update LDL since drawing blood   Essential hypertension S: controlled on benazepril 40mg , hctz 25mg . Still with some edema ASCVD 10 year risk calculation if age 69-79: elevated but already on statin to reduce risk BP Readings from Last 3 Encounters:  03/23/17 130/72  03/16/17 132/60  10/31/16 120/62  A/P: We discussed blood pressure goal of <140/90 due to diabetes. Continue current meds  CAD (coronary artery disease) Has 2-4 hour surgery for cochlear implant coming up. Get plugged back into cardiology for their opinion on updating stress test. He has previously declined this but agrees in light of surgery. He is able to complete 4 METS without chest pain or shortness of breath fortunately.   Prostate cancer [facility]) 2008 diagnosed- no treatment, no pain or other issues. Refuses treatment still. Self catheter 2-3x a day through [place].   Diastolic CHF ([facility]) Edema noted but stable. No shortness of breath. Has ben off lasix since about a year ago and Continue to monitor- weight up about 10 lbs in that time frame.     Return in about 4 months (around 07/23/2017).  Orders Placed This Encounter  Procedures  . CBC    [place]  . Comprehensive metabolic panel    [place]  . Hemoglobin A1c    [place]  . LDL cholesterol, direct    [place]  . Ambulatory referral to Cardiology    Referral Priority:   Routine    Referral Type:   Consultation    Referral Reason:   Specialty Services Required    Requested Specialty:   Cardiology    Number of Visits Requested:   1  . Ambulatory referral to Podiatry     Referral Priority:   Routine    Referral Type:   Consultation    Referral Reason:   Specialty Services Required    Requested Specialty:   Podiatry    Number of Visits Requested:   1   Return precautions advised.  [name], MD

## 2017-03-23 NOTE — Assessment & Plan Note (Signed)
S: reasonably controlled in September 2017 with LDL 66 on lovastatin 50mg . No myalgias.   A/P: continue current meds- update LDL since drawing blood

## 2017-03-23 NOTE — Patient Instructions (Signed)
We will call you within a week or two about your referral to cardiology and podiatry (foot doctor). If you do not hear within 3 weeks, give [place] a call.   Please stop by lab before you go

## 2017-03-23 NOTE — Assessment & Plan Note (Signed)
2008 diagnosed- no treatment, no pain or other issues. Refuses treatment still. Self catheter 2-3x a day through [place].

## 2017-03-24 ENCOUNTER — Other Ambulatory Visit: Payer: Self-pay | Admitting: Family Medicine

## 2017-03-24 ENCOUNTER — Other Ambulatory Visit: Payer: Self-pay

## 2017-03-24 MED ORDER — METFORMIN HCL 500 MG PO TABS: 500.0000 mg | ORAL_TABLET | Freq: Two times a day (BID) | ORAL | 3 refills | Status: DC

## 2017-04-20 DIAGNOSIS — Z01810 Encounter for preprocedural cardiovascular examination: Secondary | ICD-10-CM | POA: Diagnosis not present

## 2017-04-20 DIAGNOSIS — I2510 Atherosclerotic heart disease of native coronary artery without angina pectoris: Secondary | ICD-10-CM | POA: Diagnosis not present

## 2017-04-20 DIAGNOSIS — R5383 Other fatigue: Secondary | ICD-10-CM | POA: Diagnosis not present

## 2017-04-20 DIAGNOSIS — H903 Sensorineural hearing loss, bilateral: Secondary | ICD-10-CM | POA: Diagnosis not present

## 2017-04-20 DIAGNOSIS — I10 Essential (primary) hypertension: Secondary | ICD-10-CM | POA: Diagnosis not present

## 2017-04-20 DIAGNOSIS — E1165 Type 2 diabetes mellitus with hyperglycemia: Secondary | ICD-10-CM | POA: Diagnosis not present

## 2017-04-20 DIAGNOSIS — R0683 Snoring: Secondary | ICD-10-CM | POA: Diagnosis not present

## 2017-04-20 DIAGNOSIS — M5030 Other cervical disc degeneration, unspecified cervical region: Secondary | ICD-10-CM | POA: Diagnosis not present

## 2017-05-01 DIAGNOSIS — E1165 Type 2 diabetes mellitus with hyperglycemia: Secondary | ICD-10-CM | POA: Diagnosis not present

## 2017-05-01 DIAGNOSIS — H903 Sensorineural hearing loss, bilateral: Secondary | ICD-10-CM | POA: Diagnosis not present

## 2017-05-02 DIAGNOSIS — H903 Sensorineural hearing loss, bilateral: Secondary | ICD-10-CM | POA: Diagnosis not present

## 2017-05-02 DIAGNOSIS — Z794 Long term (current) use of insulin: Secondary | ICD-10-CM | POA: Diagnosis not present

## 2017-05-02 DIAGNOSIS — E1165 Type 2 diabetes mellitus with hyperglycemia: Secondary | ICD-10-CM | POA: Diagnosis not present

## 2017-05-11 DIAGNOSIS — I2510 Atherosclerotic heart disease of native coronary artery without angina pectoris: Secondary | ICD-10-CM | POA: Diagnosis not present

## 2017-05-11 DIAGNOSIS — Z8673 Personal history of transient ischemic attack (TIA), and cerebral infarction without residual deficits: Secondary | ICD-10-CM | POA: Diagnosis not present

## 2017-05-11 DIAGNOSIS — Z794 Long term (current) use of insulin: Secondary | ICD-10-CM | POA: Diagnosis not present

## 2017-05-11 DIAGNOSIS — H903 Sensorineural hearing loss, bilateral: Secondary | ICD-10-CM | POA: Diagnosis not present

## 2017-05-11 DIAGNOSIS — Z7982 Long term (current) use of aspirin: Secondary | ICD-10-CM | POA: Diagnosis not present

## 2017-05-11 DIAGNOSIS — I10 Essential (primary) hypertension: Secondary | ICD-10-CM | POA: Diagnosis not present

## 2017-05-11 DIAGNOSIS — Z87891 Personal history of nicotine dependence: Secondary | ICD-10-CM | POA: Diagnosis not present

## 2017-05-11 DIAGNOSIS — G4730 Sleep apnea, unspecified: Secondary | ICD-10-CM | POA: Diagnosis not present

## 2017-05-11 DIAGNOSIS — E119 Type 2 diabetes mellitus without complications: Secondary | ICD-10-CM | POA: Diagnosis not present

## 2017-05-12 ENCOUNTER — Ambulatory Visit (INDEPENDENT_AMBULATORY_CARE_PROVIDER_SITE_OTHER): Payer: Medicare HMO | Admitting: Cardiology

## 2017-05-12 ENCOUNTER — Encounter: Payer: Self-pay | Admitting: Cardiology

## 2017-05-12 VITALS — BP 136/68 | HR 87 | Ht 70.0 in | Wt 222.8 lb

## 2017-05-12 DIAGNOSIS — R609 Edema, unspecified: Secondary | ICD-10-CM | POA: Diagnosis not present

## 2017-05-12 DIAGNOSIS — I2510 Atherosclerotic heart disease of native coronary artery without angina pectoris: Secondary | ICD-10-CM

## 2017-05-12 NOTE — Patient Instructions (Signed)
Medication Instructions:  Your physician recommends that you continue on your current medications as directed. Please refer to the Current Medication list given to you today.   Labwork: NONE ORDERED TODAY  Testing/Procedures: 1. Your physician has requested that you have an echocardiogram. Echocardiography is a painless test that uses sound waves to create images of your heart. It provides your doctor with information about the size and shape of your heart and how well your heart's chambers and valves are working. This procedure takes approximately one hour. There are no restrictions for this procedure.    Follow-Up: Your physician wants you to follow-up in: 6 MONTHS WITH DR. END You will receive a reminder letter in the mail two months in advance. If you don't receive a letter, please call our office to schedule the follow-up appointment.   Any Other Special Instructions Will Be Listed Below (If Applicable). 1. STOP USING SALT   2. YOU CAN CHANGE FROM SALT TO USING MRS. DASH SEASONINGS    If you need a refill on your cardiac medications before your next appointment, please call your pharmacy.

## 2017-05-12 NOTE — Progress Notes (Signed)
05/12/2017 [name]   1920-11-26  [id number]  Primary Physician [name] Channel Brayton Mars, MD Primary Cardiologist: New (Dr. Saunders Revel, DOD)  Reason for Visit/CC: New Patient Evaluation for CAD (re-establishing care, former patient of Dr.Brodie)  Note: the patient is very hard of hearing and s/p part 1 of 2 cochlear implant (not yet turned on)  HPI:  [name] is a 83 y.o. AA  male with a h/o CAD. He was followed in the past by Dr. Eustace [name] but was lost to f/u. In 2003, he underwent a LHC which showed 3V CAD with preserved LVEF and was referred for CABG at that time. Also with DM, HTN and h/o prostate CA. His last echo in 2014 showed normal LVEF at 55-65% with G1DD. Mild AS and MR.  He was seen by Dr. [name], his PCP, on 03/23/17. This was prior to his surgery. According to his note, Dr. Yong Channel wanted him seen by cardiology prior to surgery for clearance. His note states the following, "pt has 2-4 hour surgery for cochlear implant coming up. Get plugged back into cardiology for their opinion on updating stress test. He has previously declined this but agrees in light of surgery. He is able to complete 4 METS without chest pain or shortness of breath fortunately".  The patient however has already had his surgery at [facility] on July 9th. He was seen there yesterday for post op follow-up. He is doing well but will need to undergo a 2nd surgery, as reported by his niece. After which his implant will get turned on.    From a cardiac standpoint, he has done well. He denies any chest pain. No palpitations, dizziness, syncope/ near syncope. He does have bilateral LEE, which he reports is chronic. No significant dyspnea, but it sounds like he gets occasional bouts of dyspnea that last only a few seconds before disappearing. He admits that he adds salt to his food. His last echo in 2014 showed normal LVEF at 55-65% with G1DD. Mild AS and MR. He is on HCTZ 25 mg daily and reports full compliance.  EKG today shows NSR with chronic RBBB. HR 87 bpm. BP is well controlled at 136/68.     Current Meds  Medication Sig  . ACCU-CHEK AVIVA PLUS test strip USE TO TEST 2 TIMES DAILY  . ACCU-CHEK SOFTCLIX LANCETS lancets TEST TWO TIMES DAILY  . aspirin (ECOTRIN) 325 MG EC tablet Take 325 mg by mouth daily.    . benazepril (LOTENSIN) 40 MG tablet TAKE 1 TABLET EVERY DAY  . hydrochlorothiazide (HYDRODIURIL) 25 MG tablet TAKE 1 TABLET EVERY DAY  . insulin NPH-regular Human (NOVOLIN 70/30) (70-30) 100 UNIT/ML injection Inject 25 units into the skin each morning and 18 units into the skin each evening.  . Insulin Pen Needle (BD PEN NEEDLE NANO U/F) 32G X 4 MM MISC USE TWICE DAILY. DX: E11.9  . Insulin Syringe-Needle U-100 25G X 5/8" 1 ML MISC Use twice a day. May substitute as needed for relion insulin 70/30.  Marland Kitchen lovastatin (MEVACOR) 40 MG tablet Take 1 tablet (40 mg total) by mouth at bedtime.  . metFORMIN (GLUCOPHAGE) 500 MG tablet Take 500 mg by mouth daily with breakfast.  . vitamin B-12 (CYANOCOBALAMIN) 100 MCG tablet Take 50 mcg by mouth daily.    . vitamin C (ASCORBIC ACID) 500 MG tablet Take 500 mg by mouth daily.     No Known Allergies Past Medical History:  Diagnosis Date  . Arthritis   .  CAD (coronary artery disease)   . CAD (coronary artery disease)    post diaphragjatic wall infarction and subsequent coronary artery bypass graft surgery in 2003, now stable  . Diabetes mellitus   . Elevated PSA   . GERD (gastroesophageal reflux disease)   . Hemorrhoids   . Hx of adenomatous colonic polyps   . Hyperlipidemia   . Hypertension   . Prostate cancer [facility])    Family History  Problem Relation Age of Onset  . Heart disease Mother   . Diabetes Mother    Past Surgical History:  Procedure Laterality Date  . CORONARY ARTERY BYPASS GRAFT     Social History   Social History  . Marital status: Divorced    Spouse name: N/A  . Number of children: N/A  . Years of education: N/A    Occupational History  . Retired [employer]   Social History Main Topics  . Smoking status: Former Smoker    Quit date: 10/25/1948  . Smokeless tobacco: Never Used  . Alcohol use No  . Drug use: No  . Sexual activity: Not Currently   Other Topics Concern  . Not on file   Social History Narrative   Divorced. 4 children. 7 grandkids. 2 greatgrandkids.       Retired from working at [employer] in [employer]: go to gym 3 days a week, tv and sports     Review of Systems: General: negative for chills, fever, night sweats or weight changes.  Cardiovascular: negative for chest pain, dyspnea on exertion, edema, orthopnea, palpitations, paroxysmal nocturnal dyspnea or shortness of breath Dermatological: negative for rash Respiratory: negative for cough or wheezing Urologic: negative for hematuria Abdominal: negative for nausea, vomiting, diarrhea, bright red blood per rectum, melena, or hematemesis Neurologic: negative for visual changes, syncope, or dizziness All other systems reviewed and are otherwise negative except as noted above.   Physical Exam:  Blood pressure 136/68, pulse 87, height 5\' 10"  (1.778 m), weight 222 lb 12.8 oz (101.1 kg).  General appearance: alert, cooperative, no distress and moderately obese, very hard of hearing Neck: no carotid bruit and no JVD Lungs: clear to auscultation bilaterally Heart: regular rate and rhythm, S1, S2 normal, no murmur, click, rub or gallop Extremities: 2+ bilateral LEE Pulses: 2+ and symmetric Skin: Skin color, texture, turgor normal. No rashes or lesions Neurologic: Grossly normal  EKG NSR with chronic RBBB 87 bpm -- personally reviewed   ASSESSMENT AND PLAN:   1. CAD: h/o CABG in 2003. He denies any CP or significant dyspnea. No exertional symptoms. Continue medical therapy. ASA and statin.   2. HTN: well controlled on current regimen.   3. Bilateral LEE: discussed with Dr. Saunders Revel DOD. We will  order a 2D echo. Avoid salt. Continue HCTZ. Elevated legs for edema when able.   3. HLD: followed by PCP. LDL controlled at 66 mg/dL. Continue statin.  4. S/p cochlear implant: followed  at [facility]:   Follow-Up with Dr. Saunders Revel in 6 months. If abnormal echo, we will bring patient back sooner.   Brittainy Ladoris Gene, MHS [facility] 05/12/2017 3:55 PM

## 2017-05-18 ENCOUNTER — Other Ambulatory Visit: Payer: Self-pay | Admitting: Family Medicine

## 2017-05-19 ENCOUNTER — Other Ambulatory Visit: Payer: Self-pay

## 2017-05-19 ENCOUNTER — Encounter (INDEPENDENT_AMBULATORY_CARE_PROVIDER_SITE_OTHER): Payer: Self-pay

## 2017-05-19 ENCOUNTER — Ambulatory Visit (HOSPITAL_COMMUNITY): Payer: Medicare HMO | Attending: Cardiology

## 2017-05-19 DIAGNOSIS — I088 Other rheumatic multiple valve diseases: Secondary | ICD-10-CM | POA: Diagnosis not present

## 2017-05-19 DIAGNOSIS — I5030 Unspecified diastolic (congestive) heart failure: Secondary | ICD-10-CM | POA: Diagnosis not present

## 2017-05-19 DIAGNOSIS — R609 Edema, unspecified: Secondary | ICD-10-CM

## 2017-05-22 ENCOUNTER — Telehealth: Payer: Self-pay | Admitting: Cardiology

## 2017-05-22 NOTE — Telephone Encounter (Signed)
Called back and spoke with patient. He is aware of results and verbalized understanding.

## 2017-05-22 NOTE — Telephone Encounter (Signed)
Follow Up:; ° ° °Returning your call. °

## 2017-05-22 NOTE — Telephone Encounter (Signed)
Called patient, unable to reach left message to give us a call back. 

## 2017-05-22 NOTE — Telephone Encounter (Signed)
Folllow Up:    Returning your call from this morning.

## 2017-05-25 DIAGNOSIS — H903 Sensorineural hearing loss, bilateral: Secondary | ICD-10-CM | POA: Diagnosis not present

## 2017-05-25 DIAGNOSIS — Z09 Encounter for follow-up examination after completed treatment for conditions other than malignant neoplasm: Secondary | ICD-10-CM | POA: Diagnosis not present

## 2017-05-25 DIAGNOSIS — Z9621 Cochlear implant status: Secondary | ICD-10-CM | POA: Diagnosis not present

## 2017-06-01 DIAGNOSIS — C61 Malignant neoplasm of prostate: Secondary | ICD-10-CM | POA: Diagnosis not present

## 2017-06-01 LAB — PSA: PSA: 17.1

## 2017-06-08 DIAGNOSIS — C61 Malignant neoplasm of prostate: Secondary | ICD-10-CM | POA: Diagnosis not present

## 2017-06-08 DIAGNOSIS — R339 Retention of urine, unspecified: Secondary | ICD-10-CM | POA: Diagnosis not present

## 2017-06-12 DIAGNOSIS — Z9621 Cochlear implant status: Secondary | ICD-10-CM | POA: Diagnosis not present

## 2017-06-12 DIAGNOSIS — H905 Unspecified sensorineural hearing loss: Secondary | ICD-10-CM | POA: Diagnosis not present

## 2017-06-14 ENCOUNTER — Encounter: Payer: Self-pay | Admitting: Family Medicine

## 2017-07-05 ENCOUNTER — Encounter: Payer: Self-pay | Admitting: *Deleted

## 2017-07-05 NOTE — Progress Notes (Signed)
Patient walked into office.  Stated he has been unable to sleep except for about 2 hours every night.  The rest of the time he is wide awake.  After his bypass surgery years ago he was prescribed Ambien.  Dr. Yong Channel is his PCP.  I advised to contact Dr. Ansel Bong office to discuss any sleep aids.  He is appreciative for this information.  Pt is very HOH, we used pen and paper to communicate.

## 2017-07-06 ENCOUNTER — Ambulatory Visit (INDEPENDENT_AMBULATORY_CARE_PROVIDER_SITE_OTHER): Payer: Medicare HMO | Admitting: Family Medicine

## 2017-07-06 ENCOUNTER — Encounter: Payer: Self-pay | Admitting: Family Medicine

## 2017-07-06 VITALS — BP 122/64 | HR 87 | Temp 98.0°F | Ht 70.0 in | Wt 224.6 lb

## 2017-07-06 DIAGNOSIS — G4700 Insomnia, unspecified: Secondary | ICD-10-CM | POA: Diagnosis not present

## 2017-07-06 MED ORDER — TRAZODONE HCL 50 MG PO TABS: 25.0000 mg | ORAL_TABLET | Freq: Every evening | ORAL | 3 refills | Status: DC | PRN

## 2017-07-06 NOTE — Patient Instructions (Addendum)
Do not watch tv for at least an hour before bed Use your bedroom ONLY for sleep. Do other activities in other rooms if possible.  Try to avoid napping in the daytime Go to bed the same time each day  Trial melatonin over the counter anywhere from 2-5 mg nightly a few minutes before bed for 2 weeks.   If this does not work, may try the medication I sent in called trazodone. Start with half tablet (25mg ) and if that doesn't work may use full tablet (50mg )  Please read the inserts about potential side effects on medicine since reading is easier for you to get information for now

## 2017-07-06 NOTE — Assessment & Plan Note (Signed)
See office note 07/06/17.

## 2017-07-06 NOTE — Progress Notes (Signed)
Subjective:  [name] is a 83 y.o. year old very pleasant male patient who presents for/with See problem oriented charting ROS- No chest pain or shortness of breath. No headache or blurry vision.  Complains of insomnia.   Past Medical History-  Patient Active Problem List   Diagnosis Date Noted  . Diastolic CHF ([place]) 32/20/2542    Priority: High  . Prostate cancer ([place]) 08/21/2008    Priority: High  . Diabetes mellitus type II, uncontrolled ([place]) 06/04/2007    Priority: High  . CAD (coronary artery disease) 06/04/2007    Priority: High  . Hyperlipidemia 06/04/2007    Priority: Medium  . Essential hypertension 06/04/2007    Priority: Medium  . Constipation 06/16/2010    Priority: Low  . GERD 08/04/2009    Priority: Low  . Umbilical hernia 70/62/3762    Priority: Low  . Hiatal hernia 02/13/2008    Priority: Low  . DEGENERATIVE DISC DISEASE, CERVICAL SPINE 02/13/2008    Priority: Low  . Pain in right ankle and joints of right foot 08/15/2016    Medications- reviewed and updated Current Outpatient Prescriptions  Medication Sig Dispense Refill  . ACCU-CHEK AVIVA PLUS test strip USE TO TEST 2 TIMES DAILY 180 each 3  . ACCU-CHEK SOFTCLIX LANCETS lancets USE  TO  TEST TWICE DAILY 200 each 3  . aspirin (ECOTRIN) 325 MG EC tablet Take 325 mg by mouth daily.      . benazepril (LOTENSIN) 40 MG tablet TAKE 1 TABLET EVERY DAY 90 tablet 3  . hydrochlorothiazide (HYDRODIURIL) 25 MG tablet TAKE 1 TABLET EVERY DAY 90 tablet 3  . insulin NPH-regular Human (NOVOLIN 70/30) (70-30) 100 UNIT/ML injection Inject 25 units into the skin each morning and 18 units into the skin each evening.    . Insulin Pen Needle (BD PEN NEEDLE NANO U/F) 32G X 4 MM MISC USE TWICE DAILY. DX: E11.9 90 each 11  . Insulin Syringe-Needle U-100 25G X 5/8" 1 ML MISC Use twice a day. May substitute as needed for relion insulin 70/30. 100 each 5  . lovastatin (MEVACOR) 40 MG tablet Take 1 tablet (40 mg total) by  mouth at bedtime. 90 tablet 3  . metFORMIN (GLUCOPHAGE) 500 MG tablet Take 500 mg by mouth daily with breakfast.    . vitamin B-12 (CYANOCOBALAMIN) 100 MCG tablet Take 50 mcg by mouth daily.      . vitamin C (ASCORBIC ACID) 500 MG tablet Take 500 mg by mouth daily.       No current facility-administered medications for this visit.     Objective: BP 122/64 (BP Location: Left Arm, Patient Position: Sitting, Cuff Size: Large)   Pulse 87   Temp 98 F (36.7 C) (Oral)   Ht 5\' 10"  (1.778 m)   Wt 224 lb 9.6 oz (101.9 kg)   SpO2 96%   BMI 32.23 kg/m  Gen: NAD, resting comfortably CV: RRR no murmurs rubs or gallops Lungs: CTAB no crackles, wheeze, rhonchi Ext: stable pitting edema Skin: warm, dry Neuro: essentially deaf- have to write out everything to patient  Assessment/Plan:  Insomnia, unspecified type S: for the last 2 months has been sleeping about 2 hours then cannot get back to sleep. Does watch tv at bedtime and often falls asleep with it on. Can fall asleep but then has a hard time getting back to sleep when he wakes up in 2 hours. Wakes up tired next day. Ends up napping fair amount A/P: Insomnia- no issues  with onset but more duration. Counseling and medication counseling provided Patient Instructions  Do not watch tv for at least an hour before bed Use your bedroom ONLY for sleep. Do other activities in other rooms if possible.  Try to avoid napping in the daytime Go to bed the same time each day  Trial melatonin over the counter anywhere from 2-5 mg nightly a few minutes before bed for 2 weeks.   If this does not work, may try the medication I sent in called trazodone. Start with half tablet (25mg ) and if that doesn't work may use full tablet (50mg )  Please read the inserts about potential side effects on medicine since reading is easier for you to get information for now  Future Appointments Date Time Provider [place]  07/06/2017 8:30 AM [name], MD  [facility]  07/21/2017 8:15 AM Yong Channel Brayton Mars, MD [facility]   Meds ordered this encounter  Medications  . DISCONTD: ACCU-CHEK SOFTCLIX LANCETS lancets  . traZODone (DESYREL) 50 MG tablet    Sig: Take 0.5-1 tablets (25-50 mg total) by mouth at bedtime as needed for sleep.    Dispense:  30 tablet    Refill:  3    Return precautions advised.  [name], MD

## 2017-07-21 ENCOUNTER — Encounter: Payer: Self-pay | Admitting: Family Medicine

## 2017-07-21 ENCOUNTER — Ambulatory Visit (INDEPENDENT_AMBULATORY_CARE_PROVIDER_SITE_OTHER): Payer: Medicare HMO | Admitting: Family Medicine

## 2017-07-21 VITALS — BP 126/70 | HR 81 | Temp 98.0°F | Ht 70.0 in | Wt 225.4 lb

## 2017-07-21 DIAGNOSIS — Z23 Encounter for immunization: Secondary | ICD-10-CM

## 2017-07-21 DIAGNOSIS — E785 Hyperlipidemia, unspecified: Secondary | ICD-10-CM

## 2017-07-21 DIAGNOSIS — Z794 Long term (current) use of insulin: Secondary | ICD-10-CM | POA: Diagnosis not present

## 2017-07-21 DIAGNOSIS — I10 Essential (primary) hypertension: Secondary | ICD-10-CM

## 2017-07-21 DIAGNOSIS — IMO0001 Reserved for inherently not codable concepts without codable children: Secondary | ICD-10-CM

## 2017-07-21 DIAGNOSIS — E1165 Type 2 diabetes mellitus with hyperglycemia: Secondary | ICD-10-CM

## 2017-07-21 DIAGNOSIS — G4700 Insomnia, unspecified: Secondary | ICD-10-CM | POA: Diagnosis not present

## 2017-07-21 LAB — CBC
HCT: 34.9 % — ABNORMAL LOW (ref 39.0–52.0)
Hemoglobin: 11.3 g/dL — ABNORMAL LOW (ref 13.0–17.0)
MCHC: 32.2 g/dL (ref 30.0–36.0)
MCV: 93.4 fl (ref 78.0–100.0)
Platelets: 377 10*3/uL (ref 150.0–400.0)
RBC: 3.74 Mil/uL — ABNORMAL LOW (ref 4.22–5.81)
RDW: 13.4 % (ref 11.5–15.5)
WBC: 6.8 10*3/uL (ref 4.0–10.5)

## 2017-07-21 LAB — COMPREHENSIVE METABOLIC PANEL
ALT: 7 U/L (ref 0–53)
AST: 11 U/L (ref 0–37)
Albumin: 3.8 g/dL (ref 3.5–5.2)
Alkaline Phosphatase: 44 U/L (ref 39–117)
BUN: 23 mg/dL (ref 6–23)
CO2: 27 mEq/L (ref 19–32)
Calcium: 9.5 mg/dL (ref 8.4–10.5)
Chloride: 103 mEq/L (ref 96–112)
Creatinine, Ser: 1.18 mg/dL (ref 0.40–1.50)
GFR: 73.81 mL/min (ref >60.00)
Glucose, Bld: 148 mg/dL — ABNORMAL HIGH (ref 70–99)
Potassium: 4.6 mEq/L (ref 3.5–5.1)
Sodium: 137 mEq/L (ref 135–145)
Total Bilirubin: 0.4 mg/dL (ref 0.2–1.2)
Total Protein: 6.7 g/dL (ref 6.0–8.3)

## 2017-07-21 LAB — LIPID PANEL
Cholesterol: 145 mg/dL (ref 0–200)
HDL: 49 mg/dL (ref >39.00)
LDL Cholesterol: 79 mg/dL (ref 0–99)
NonHDL: 96.03
Total CHOL/HDL Ratio: 3
Triglycerides: 86 mg/dL (ref 0.0–149.0)
VLDL: 17.2 mg/dL (ref 0.0–40.0)

## 2017-07-21 LAB — HEMOGLOBIN A1C: Hgb A1c MFr Bld: 7.9 % — ABNORMAL HIGH (ref 4.6–6.5)

## 2017-07-21 NOTE — Assessment & Plan Note (Signed)
S: controlled on  benazepril 40mg  and hctz 25 mg BP Readings from Last 3 Encounters:  07/21/17 126/70  07/06/17 122/64  05/12/17 136/68  A/P: We discussed blood pressure goal of <140/90. Continue current meds

## 2017-07-21 NOTE — Assessment & Plan Note (Signed)
S: poorly controlled overall but reasonable for age and comorbidities. On last check on insulin NPH/R 25 units in AM and 18 units in PM. Also on metformin 500mg  daily CBGs- 123 this Am Lab Results  Component Value Date   HGBA1C 8.5 (H) 03/23/2017   HGBA1C 7.6 (H) 07/11/2016   HGBA1C 8.4 (H) 09/24/2015   A/P: Prefer a1c under 8 if possible but at least under 8.5- will likely not be more aggressive unless gets above 8.5. a1c today

## 2017-07-21 NOTE — Patient Instructions (Addendum)
Thanks for getting your flu shot  Please confirm no food or drink yet this morning  Please stop by lab before you go  Use full pill of trazodone at night along with melatonin 2-5 mg. Try EVERY night for 2 weeks. Write [place] a letter in 2 weeks if this is not helping and drop off at [place] [facility].

## 2017-07-21 NOTE — Assessment & Plan Note (Signed)
S: reasonably controlled on last check with LDL 72 on lovastatin 40mg . No myalgias.  A/P: update full lipid panel today

## 2017-07-21 NOTE — Progress Notes (Signed)
Subjective:  [name] is a 83 y.o. year old very pleasant male patient who presents for/with See problem oriented charting ROS- No chest pain or shortness of breath. No headache or blurry vision.    Past Medical History-  Patient Active Problem List   Diagnosis Date Noted  . Diastolic CHF ([place]) 76/16/0737    Priority: High  . Prostate cancer ([place]) 08/21/2008    Priority: High  . Diabetes mellitus type II, uncontrolled ([place]) 06/04/2007    Priority: High  . CAD (coronary artery disease) 06/04/2007    Priority: High  . Hyperlipidemia 06/04/2007    Priority: Medium  . Essential hypertension 06/04/2007    Priority: Medium  . Constipation 06/16/2010    Priority: Low  . GERD 08/04/2009    Priority: Low  . Umbilical hernia 10/62/6948    Priority: Low  . Hiatal hernia 02/13/2008    Priority: Low  . DEGENERATIVE DISC DISEASE, CERVICAL SPINE 02/13/2008    Priority: Low  . Insomnia 07/06/2017  . Pain in right ankle and joints of right foot 08/15/2016    Medications- reviewed and updated Current Outpatient Prescriptions  Medication Sig Dispense Refill  . ACCU-CHEK AVIVA PLUS test strip USE TO TEST 2 TIMES DAILY 180 each 3  . ACCU-CHEK SOFTCLIX LANCETS lancets USE  TO  TEST TWICE DAILY 200 each 3  . aspirin (ECOTRIN) 325 MG EC tablet Take 325 mg by mouth daily.      . benazepril (LOTENSIN) 40 MG tablet TAKE 1 TABLET EVERY DAY 90 tablet 3  . hydrochlorothiazide (HYDRODIURIL) 25 MG tablet TAKE 1 TABLET EVERY DAY 90 tablet 3  . insulin NPH-regular Human (NOVOLIN 70/30) (70-30) 100 UNIT/ML injection Inject 25 units into the skin each morning and 18 units into the skin each evening.    . Insulin Pen Needle (BD PEN NEEDLE NANO U/F) 32G X 4 MM MISC USE TWICE DAILY. DX: E11.9 90 each 11  . Insulin Syringe-Needle U-100 25G X 5/8" 1 ML MISC Use twice a day. May substitute as needed for relion insulin 70/30. 100 each 5  . lovastatin (MEVACOR) 40 MG tablet Take 1 tablet (40 mg total) by  mouth at bedtime. 90 tablet 3  . metFORMIN (GLUCOPHAGE) 500 MG tablet Take 500 mg by mouth daily with breakfast.    . traZODone (DESYREL) 50 MG tablet Take 0.5-1 tablets (25-50 mg total) by mouth at bedtime as needed for sleep. 30 tablet 3  . vitamin B-12 (CYANOCOBALAMIN) 100 MCG tablet Take 50 mcg by mouth daily.      . vitamin C (ASCORBIC ACID) 500 MG tablet Take 500 mg by mouth daily.       Objective: BP 126/70 (BP Location: Left Arm, Patient Position: Sitting, Cuff Size: Large)   Pulse 81   Temp 98 F (36.7 C) (Oral)   Ht 5\' 10"  (1.778 m)   Wt 225 lb 6.4 oz (102.2 kg)   SpO2 98%   BMI 32.34 kg/m  Gen: NAD, deaf essentially- we wrote on paper CV: RRR no murmurs rubs or gallops Lungs: CTAB no crackles, wheeze, rhonchi Abdomen: soft/nontender/nondistended/normal bowel sounds. No rebound or guarding.  Ext: stable pitting edema Skin: warm, dry  Assessment/Plan:  Diabetes mellitus type II, uncontrolled (HCC) S: poorly controlled overall but reasonable for age and comorbidities. On last check on insulin NPH/R 25 units in AM and 18 units in PM. Also on metformin 500mg  daily CBGs- 123 this Am Lab Results  Component Value Date   HGBA1C 8.5 (  H) 03/23/2017   HGBA1C 7.6 (H) 07/11/2016   HGBA1C 8.4 (H) 09/24/2015   A/P: Prefer a1c under 8 if possible but at least under 8.5- will likely not be more aggressive unless gets above 8.5. a1c today   Essential hypertension S: controlled on  benazepril 40mg  and hctz 25 mg BP Readings from Last 3 Encounters:  07/21/17 126/70  07/06/17 122/64  05/12/17 136/68  A/P: We discussed blood pressure goal of <140/90. Continue current meds  Insomnia S: Patient complained of poor sleep at last visit- sleeping about 2 hours then getting back to sleep.   Was watching tv at bedtime and advised against. Was taking daytime naps- advised against.   Advised melatonin trial 2-5mg  nightly for 2 weeks then start trazodone if not effective. He states the  pills we gave him didn't help and are for mental health issues- not clear if he has tried full pill or melatonin A/P: advised patient trial trazodone 50mg  along with melatonin. If not helping within 2 weeks would consider alternate geriatric option first- possibly doxepin  Hyperlipidemia S: reasonably controlled on last check with LDL 72 on lovastatin 40mg . No myalgias.  A/P: update full lipid panel today  Return in about 4 months (around 11/20/2017) for follow up- or sooner if needed.  Orders Placed This Encounter  Procedures  . CBC    [place]  . Comprehensive metabolic panel    [place]    Order Specific Question:   Has the patient fasted?    Answer:   No  . Hemoglobin A1c    [place]  . Lipid panel    [place]    Order Specific Question:   Has the patient fasted?    Answer:   No    Return precautions advised.  [name], MD

## 2017-07-21 NOTE — Assessment & Plan Note (Signed)
S: Patient complained of poor sleep at last visit- sleeping about 2 hours then getting back to sleep.   Was watching tv at bedtime and advised against. Was taking daytime naps- advised against.   Advised melatonin trial 2-5mg  nightly for 2 weeks then start trazodone if not effective. He states the pills we gave him didn't help and are for mental health issues- not clear if he has tried full pill or melatonin A/P: advised patient trial trazodone 50mg  along with melatonin. If not helping within 2 weeks would consider alternate geriatric option first- possibly doxepin

## 2017-07-21 NOTE — Addendum Note (Signed)
Addended by: Lyndle Herrlich on: 07/21/2017 09:13 AM   Modules accepted: Orders

## 2017-08-01 ENCOUNTER — Other Ambulatory Visit: Payer: Self-pay

## 2017-08-01 ENCOUNTER — Telehealth: Payer: Self-pay | Admitting: Family Medicine

## 2017-08-01 NOTE — Telephone Encounter (Signed)
MEDICATION:  BD Ultra Fine Pen Short Pen Needle 31G x 5/16th inch  BD Single Use Swabs  PHARMACY:   [place], [place] [phone] (Phone) [phone] (Fax)   IS THIS A 90 DAY SUPPLY : yes  IS PATIENT OUT OF MEDICATION: yes  IF NOT; HOW MUCH IS LEFT: n/a  LAST APPOINTMENT DATE: @9 /28/18  NEXT APPOINTMENT DATE:@Visit  date not found  OTHER COMMENTS:    **Let patient know to contact pharmacy at the end of the day to make sure medication is ready. **  ** Please notify patient to allow 48-72 hours to process**  **Encourage patient to contact the pharmacy for refills or they can request refills through [facility]**

## 2017-08-01 NOTE — Telephone Encounter (Signed)
[facility] and provided order for Alcohol swabs and the Nano needles.

## 2017-08-07 DIAGNOSIS — H903 Sensorineural hearing loss, bilateral: Secondary | ICD-10-CM | POA: Diagnosis not present

## 2017-08-07 DIAGNOSIS — H9190 Unspecified hearing loss, unspecified ear: Secondary | ICD-10-CM | POA: Diagnosis not present

## 2017-08-14 ENCOUNTER — Ambulatory Visit (INDEPENDENT_AMBULATORY_CARE_PROVIDER_SITE_OTHER): Payer: Medicare HMO | Admitting: Family Medicine

## 2017-08-14 ENCOUNTER — Encounter: Payer: Self-pay | Admitting: Family Medicine

## 2017-08-14 VITALS — BP 150/82 | HR 85 | Ht 68.0 in | Wt 224.0 lb

## 2017-08-14 DIAGNOSIS — F5101 Primary insomnia: Secondary | ICD-10-CM

## 2017-08-14 NOTE — Progress Notes (Signed)
Subjective:    Patient ID: [name], male    DOB: 07-28-1921, 83 y.o.   MRN: [id number]  No chief complaint on file.   HPI  Conversation with pt via writing on paper---essentially deaf. Patient was seen today for f/u on insomnia.  Was seen by Dr. Yong Channel.  Pt was given a rx for Trazadone.  Unclear if pt tried the med or not.  Pt states he was upset that the medicine given was for ppl with mental issues.  Pt states he looked up Lunesta in a medical journal and feels it is the best for sleep.  Pt states if he doesn't get a rx for Lunesta he will leave.  Has patient was exiting the exam room he became loud stating that he was respected this provider's decision however he was "going to die anyway" and this provider should "respect his choice as the patient".  Past Medical History:  Diagnosis Date  . Arthritis   . CAD (coronary artery disease)   . CAD (coronary artery disease)    post diaphragjatic wall infarction and subsequent coronary artery bypass graft surgery in 2003, now stable  . Diabetes mellitus   . Elevated PSA   . GERD (gastroesophageal reflux disease)   . Hemorrhoids   . Hx of adenomatous colonic polyps   . Hyperlipidemia   . Hypertension   . Prostate cancer ([place])     No Known Allergies  ROS General: Denies fever, chills, night sweats, changes in weight, changes in appetite HEENT: Denies headaches, ear pain, changes in vision, rhinorrhea, sore throat CV: Denies CP, palpitations, SOB, orthopnea Pulm: Denies SOB, cough, wheezing GI: Denies abdominal pain, nausea, vomiting, diarrhea, constipation GU: Denies dysuria, hematuria, frequency, vaginal discharge Msk: Denies muscle cramps, joint pains Neuro: Denies weakness, numbness, tingling Skin: Denies rashes, bruising Psych: Denies depression, anxiety, hallucinations  +insomnia     Objective:    Blood pressure (!) 150/82, pulse 85, height 5\' 8"  (1.727 m), weight 224 lb (101.6 kg).   Gen. Pleasant,  well-nourished, in no distress, normal affect  Speaks in a loud tone at baseline. HEENT: Essentially deaf- hearing aid in place-wrote questions down on paper.  [place]/AT, face symmetric, no scleral icterus, PERRLA, nares patent without drainage, pharynx without erythema or exudate. Lungs: no accessory muscle use, normal breathing Cardiovascular: RRR, no peripheral edema Neuro:  A&Ox3, CN II-XII intact, normal gait Skin:  Warm, no lesions/ rash   Wt Readings from Last 3 Encounters:  08/14/17 224 lb (101.6 kg)  07/21/17 225 lb 6.4 oz (102.2 kg)  07/06/17 224 lb 9.6 oz (101.9 kg)    Lab Results  Component Value Date   WBC 6.8 07/21/2017   HGB 11.3 (L) 07/21/2017   HCT 34.9 (L) 07/21/2017   PLT 377.0 07/21/2017   GLUCOSE 148 (H) 07/21/2017   CHOL 145 07/21/2017   TRIG 86.0 07/21/2017   HDL 49.00 07/21/2017   LDLDIRECT 72.0 03/23/2017   LDLCALC 79 07/21/2017   ALT 7 07/21/2017   AST 11 07/21/2017   NA 137 07/21/2017   K 4.6 07/21/2017   CL 103 07/21/2017   CREATININE 1.18 07/21/2017   BUN 23 07/21/2017   CO2 27 07/21/2017   TSH 0.97 07/29/2010   PSA 17.10 06/01/2017   HGBA1C 7.9 (H) 07/21/2017   MICROALBUR 0.7 06/05/2007    Assessment/Plan:  Primary insomnia: -Patient fixated on Lunesta. -Attempted to inform patient that Johnnye Sima is considered high risk medication on the beers list. If taken patient would be  at increased risk of delirium, falls, fractures, increased emergency department visits and hospitalizations, and motor vehicle crash. -Alternatives could include Rozerem or trazodone -Patient was displeased with the above and decided to leave.

## 2017-09-06 NOTE — Progress Notes (Signed)
Hi Dr. Yong Channel,  [facility] you are doing well.  I thought I responded to this, but some of my messages have been disappearing.  The patient did not establish care with me.  He wanted me to write a prescription for Lunesta and left when I told him I would not.  He seems nice.  He did state he respected me as a doctor, but he wanted [place].  Thanks, Larene Beach

## 2017-09-26 ENCOUNTER — Other Ambulatory Visit: Payer: Self-pay | Admitting: Family Medicine

## 2017-09-27 NOTE — Telephone Encounter (Signed)
Yes thanks, may fill but is he going to follow up with me or Dr. Volanda Napoleon?

## 2017-11-08 DIAGNOSIS — H903 Sensorineural hearing loss, bilateral: Secondary | ICD-10-CM | POA: Diagnosis not present

## 2017-11-08 DIAGNOSIS — Z9621 Cochlear implant status: Secondary | ICD-10-CM | POA: Diagnosis not present

## 2017-11-16 ENCOUNTER — Other Ambulatory Visit: Payer: Self-pay | Admitting: Family Medicine

## 2017-11-17 NOTE — Telephone Encounter (Signed)
Apolonio Schneiders I will send him in #[phone]. He had a visit with Dr. Volanda Napoleon to establish care and its really unclear who he is seeing at this point- can you reach out to him to clarify? He needs a visit with either me or her soon.   [name]

## 2017-11-17 NOTE — Telephone Encounter (Signed)
Left message on machine for patient to return our call.  CRM created 

## 2017-11-20 ENCOUNTER — Other Ambulatory Visit: Payer: Self-pay | Admitting: Family Medicine

## 2017-12-08 ENCOUNTER — Encounter: Payer: Self-pay | Admitting: Family Medicine

## 2017-12-08 ENCOUNTER — Ambulatory Visit (INDEPENDENT_AMBULATORY_CARE_PROVIDER_SITE_OTHER): Payer: Medicare HMO | Admitting: Family Medicine

## 2017-12-08 VITALS — BP 128/72 | HR 89 | Temp 98.0°F | Ht 68.0 in | Wt 228.6 lb

## 2017-12-08 DIAGNOSIS — E785 Hyperlipidemia, unspecified: Secondary | ICD-10-CM

## 2017-12-08 DIAGNOSIS — F5101 Primary insomnia: Secondary | ICD-10-CM | POA: Diagnosis not present

## 2017-12-08 DIAGNOSIS — E1165 Type 2 diabetes mellitus with hyperglycemia: Secondary | ICD-10-CM | POA: Diagnosis not present

## 2017-12-08 DIAGNOSIS — I10 Essential (primary) hypertension: Secondary | ICD-10-CM | POA: Diagnosis not present

## 2017-12-08 DIAGNOSIS — C61 Malignant neoplasm of prostate: Secondary | ICD-10-CM | POA: Diagnosis not present

## 2017-12-08 LAB — COMPREHENSIVE METABOLIC PANEL
ALT: 8 U/L (ref 0–53)
AST: 9 U/L (ref 0–37)
Albumin: 3.7 g/dL (ref 3.5–5.2)
Alkaline Phosphatase: 45 U/L (ref 39–117)
BUN: 27 mg/dL — ABNORMAL HIGH (ref 6–23)
CO2: 28 mEq/L (ref 19–32)
Calcium: 9.2 mg/dL (ref 8.4–10.5)
Chloride: 106 mEq/L (ref 96–112)
Creatinine, Ser: 1.4 mg/dL (ref 0.40–1.50)
GFR: 60.55 mL/min (ref >60.00)
Glucose, Bld: 168 mg/dL — ABNORMAL HIGH (ref 70–99)
Potassium: 4.5 mEq/L (ref 3.5–5.1)
Sodium: 141 mEq/L (ref 135–145)
Total Bilirubin: 0.4 mg/dL (ref 0.2–1.2)
Total Protein: 6.6 g/dL (ref 6.0–8.3)

## 2017-12-08 LAB — CBC
HCT: 36.7 % — ABNORMAL LOW (ref 39.0–52.0)
Hemoglobin: 11.8 g/dL — ABNORMAL LOW (ref 13.0–17.0)
MCHC: 32.2 g/dL (ref 30.0–36.0)
MCV: 92.1 fl (ref 78.0–100.0)
Platelets: 353 10*3/uL (ref 150.0–400.0)
RBC: 3.99 Mil/uL — ABNORMAL LOW (ref 4.22–5.81)
RDW: 15.7 % — ABNORMAL HIGH (ref 11.5–15.5)
WBC: 7.8 10*3/uL (ref 4.0–10.5)

## 2017-12-08 LAB — HEMOGLOBIN A1C: Hgb A1c MFr Bld: 7.5 % — ABNORMAL HIGH (ref 4.6–6.5)

## 2017-12-08 MED ORDER — TRAZODONE HCL 50 MG PO TABS: 25.0000 mg | ORAL_TABLET | Freq: Every evening | ORAL | 5 refills | Status: DC | PRN

## 2017-12-08 MED ORDER — METFORMIN HCL 500 MG PO TABS: 500.0000 mg | ORAL_TABLET | Freq: Two times a day (BID) | ORAL | 3 refills | Status: DC

## 2017-12-08 NOTE — Assessment & Plan Note (Signed)
S: controlled on lovastatin 40mg  with LDL 72. No myalgias.  A/P: doing well- repeat likely October or later full lipids

## 2017-12-08 NOTE — Assessment & Plan Note (Signed)
S: controlled on  benazepril 40mg  and hctz 25mg  in the past BP Readings from Last 3 Encounters:  12/08/17 128/72  08/14/17 (!) 150/82  07/21/17 126/70  A/P:  blood pressure goal of <140/90. Continue current meds

## 2017-12-08 NOTE — Progress Notes (Signed)
Subjective:  [name] is a 84 y.o. year old very pleasant male patient who presents for/with See problem oriented charting ROS- edema slightly better. No fever or chills. Hard of hearing. No chest pain   Past Medical History-  Patient Active Problem List   Diagnosis Date Noted  . Diastolic CHF ([place]) 83/41/9622    Priority: High  . Prostate cancer ([place]) 08/21/2008    Priority: High  . Diabetes mellitus type II, uncontrolled ([place]) 06/04/2007    Priority: High  . CAD (coronary artery disease) 06/04/2007    Priority: High  . Hyperlipidemia 06/04/2007    Priority: Medium  . Essential hypertension 06/04/2007    Priority: Medium  . Constipation 06/16/2010    Priority: Low  . GERD 08/04/2009    Priority: Low  . Umbilical hernia 29/79/8921    Priority: Low  . Hiatal hernia 02/13/2008    Priority: Low  . DEGENERATIVE DISC DISEASE, CERVICAL SPINE 02/13/2008    Priority: Low  . Insomnia 07/06/2017  . Pain in right ankle and joints of right foot 08/15/2016    Medications- reviewed and updated Current Outpatient Medications  Medication Sig Dispense Refill  . ACCU-CHEK AVIVA PLUS test strip USE TO TEST 2 TIMES DAILY 180 each 3  . ACCU-CHEK SOFTCLIX LANCETS lancets USE  TO  TEST TWICE DAILY 200 each 3  . Alcohol Swabs (B-D SINGLE USE SWABS REGULAR) PADS     . aspirin (ECOTRIN) 325 MG EC tablet Take 325 mg by mouth daily.      . benazepril (LOTENSIN) 40 MG tablet TAKE 1 TABLET EVERY DAY 90 tablet 3  . hydrochlorothiazide (HYDRODIURIL) 25 MG tablet TAKE 1 TABLET EVERY DAY 90 tablet 3  . insulin NPH-regular Human (NOVOLIN 70/30) (70-30) 100 UNIT/ML injection Inject 25 units into the skin each morning and 18 units into the skin each evening.    . Insulin Pen Needle (BD PEN NEEDLE NANO U/F) 32G X 4 MM MISC USE TWICE DAILY. DX: E11.9 90 each 11  . Insulin Syringe-Needle U-100 25G X 5/8" 1 ML MISC Use twice a day. May substitute as needed for relion insulin 70/30. 100 each 5  .  lovastatin (MEVACOR) 40 MG tablet Take 1 tablet (40 mg total) by mouth at bedtime. 90 tablet 3  . metFORMIN (GLUCOPHAGE) 500 MG tablet Take 1 tablet (500 mg total) by mouth 2 (two) times daily with a meal. 180 tablet 3  . traZODone (DESYREL) 50 MG tablet Take 0.5-1 tablets (25-50 mg total) by mouth at bedtime as needed for sleep. 30 tablet 3  . vitamin B-12 (CYANOCOBALAMIN) 100 MCG tablet Take 50 mcg by mouth daily.      . vitamin C (ASCORBIC ACID) 500 MG tablet Take 500 mg by mouth daily.       No current facility-administered medications for this visit.     Objective: BP 128/72 (BP Location: Left Arm, Patient Position: Sitting, Cuff Size: Large)   Pulse 89   Temp 98 F (36.7 C) (Oral)   Ht 5\' 8"  (1.727 m)   Wt 228 lb 9.6 oz (103.7 kg)   SpO2 94%   BMI 34.76 kg/m  Gen: NAD, resting comfortably CV: RRR no murmurs rubs or gallops Lungs: CTAB no crackles, wheeze, rhonchi Abdomen: soft/nontender/nondistended/normal bowel sounds.  Ext: 1+ edema Skin: warm, dry Neuro: essentially deaf  Assessment/Plan:  Essential hypertension S: controlled on  benazepril 40mg  and hctz 25mg  in the past BP Readings from Last 3 Encounters:  12/08/17 128/72  08/14/17 (!) 150/82  07/21/17 126/70  A/P:  blood pressure goal of <140/90. Continue current meds  Hyperlipidemia S: controlled on lovastatin 40mg  with LDL 72. No myalgias.  A/P: doing well- repeat likely October or later full lipids   Diabetes mellitus type II, uncontrolled ([place]) S: reasonably controlled for age with a1c 8 or 8.5 considering his age and risk of hypoglycemia (uncontrolled overall though with ideal goal under 7). On nph/r 25 untis AM and 18 PM with metformin 500mg  BID (daily had been written down)  CBGs- denies lows- 103 this AM Lab Results  Component Value Date   HGBA1C 7.9 (H) 07/21/2017   HGBA1C 8.5 (H) 03/23/2017   HGBA1C 7.6 (H) 07/11/2016   A/P: update a1c, cbc, cmp  Insomnia S:  patient has tried melatonin  without help. He didnt want to take trazodone. He has been very specific about wanting Lunesta (high risk medication on [place] list).   When we refused lunesta- he apparently tried trazodone and now requests refill A/P: Trial low dose doxepin or rozerem if needed in future. Refilled trazodone for now at his request  Prostate cancer [facility]) Self cath 3-4x a day. Does not desire further workup  No future appointments. No Follow-up on file.  Lab/Order associations: Uncontrolled type 2 diabetes mellitus with hyperglycemia ([place]) - Plan: CBC, Comprehensive metabolic panel, Hemoglobin A1c  4 months advised and also awv advised  Meds ordered this encounter  Medications  . DISCONTD: metFORMIN (GLUCOPHAGE) 500 MG tablet    Sig: Take 1 tablet (500 mg total) by mouth 2 (two) times daily with a meal.    Dispense:  180 tablet    Refill:  3  . traZODone (DESYREL) 50 MG tablet    Sig: Take 0.5-1 tablets (25-50 mg total) by mouth at bedtime as needed for sleep.    Dispense:  30 tablet    Refill:  5  . metFORMIN (GLUCOPHAGE) 500 MG tablet    Sig: Take 1 tablet (500 mg total) by mouth 2 (two) times daily with a meal.    Dispense:  180 tablet    Refill:  3    Return precautions advised.  [name], MD

## 2017-12-08 NOTE — Assessment & Plan Note (Signed)
S:  patient has tried melatonin without help. He didnt want to take trazodone. He has been very specific about wanting Lunesta (high risk medication on [place] list).   When we refused lunesta- he apparently tried trazodone and now requests refill A/P: Trial low dose doxepin or rozerem if needed in future. Refilled trazodone for now at his request

## 2017-12-08 NOTE — Assessment & Plan Note (Addendum)
S: reasonably controlled for age with a1c 8 or 8.5 considering his age and risk of hypoglycemia (uncontrolled overall though with ideal goal under 7). On nph/r 25 untis AM and 18 PM with metformin 500mg  BID (daily had been written down)  CBGs- denies lows- 103 this AM Lab Results  Component Value Date   HGBA1C 7.9 (H) 07/21/2017   HGBA1C 8.5 (H) 03/23/2017   HGBA1C 7.6 (H) 07/11/2016   A/P: update a1c, cbc, cmp

## 2017-12-08 NOTE — Patient Instructions (Addendum)
Please stop by lab before you go  Refilled metformin for twice a day  Refilled trazodone  Glad you are doing well!   See me in 4 more months  Have you had your diabetic eye exam? We need to track that down  If you have not had one... I would also like for you to sign up for an annual wellness visit with one of our nurses, Cassie or Manuela Schwartz, who both specialize in the annual wellness visit. This is a free benefit under medicare that may help [place] find additional ways to help you. Some highlights are reviewing medications, lifestyle, and doing a dementia screen.

## 2017-12-08 NOTE — Assessment & Plan Note (Signed)
Self cath 3-4x a day. Does not desire further workup

## 2017-12-12 ENCOUNTER — Other Ambulatory Visit: Payer: Self-pay

## 2017-12-12 MED ORDER — HYDROCHLOROTHIAZIDE 25 MG PO TABS: 25.0000 mg | ORAL_TABLET | Freq: Every day | ORAL | 3 refills | Status: DC

## 2017-12-28 ENCOUNTER — Other Ambulatory Visit: Payer: Self-pay

## 2017-12-28 MED ORDER — INSULIN NPH ISOPHANE & REGULAR (70-30) 100 UNIT/ML ~~LOC~~ SUSP: SUBCUTANEOUS | 5 refills | Status: DC

## 2017-12-28 MED ORDER — "INSULIN SYRINGE-NEEDLE U-100 25G X 5/8"" 1 ML MISC": 5 refills | Status: DC

## 2018-01-04 DIAGNOSIS — H524 Presbyopia: Secondary | ICD-10-CM | POA: Diagnosis not present

## 2018-01-04 DIAGNOSIS — I10 Essential (primary) hypertension: Secondary | ICD-10-CM | POA: Diagnosis not present

## 2018-01-04 DIAGNOSIS — H52223 Regular astigmatism, bilateral: Secondary | ICD-10-CM | POA: Diagnosis not present

## 2018-01-04 DIAGNOSIS — H5203 Hypermetropia, bilateral: Secondary | ICD-10-CM | POA: Diagnosis not present

## 2018-01-04 DIAGNOSIS — Z794 Long term (current) use of insulin: Secondary | ICD-10-CM | POA: Diagnosis not present

## 2018-01-04 DIAGNOSIS — Z7984 Long term (current) use of oral hypoglycemic drugs: Secondary | ICD-10-CM | POA: Diagnosis not present

## 2018-01-04 DIAGNOSIS — H25813 Combined forms of age-related cataract, bilateral: Secondary | ICD-10-CM | POA: Diagnosis not present

## 2018-01-04 DIAGNOSIS — E119 Type 2 diabetes mellitus without complications: Secondary | ICD-10-CM | POA: Diagnosis not present

## 2018-01-04 LAB — HM DIABETES EYE EXAM

## 2018-01-12 ENCOUNTER — Encounter: Payer: Self-pay | Admitting: Family Medicine

## 2018-01-18 ENCOUNTER — Other Ambulatory Visit: Payer: Self-pay | Admitting: Family Medicine

## 2018-02-01 ENCOUNTER — Ambulatory Visit (INDEPENDENT_AMBULATORY_CARE_PROVIDER_SITE_OTHER): Payer: Self-pay

## 2018-02-01 ENCOUNTER — Ambulatory Visit (INDEPENDENT_AMBULATORY_CARE_PROVIDER_SITE_OTHER): Payer: Commercial Managed Care - HMO | Admitting: Orthopaedic Surgery

## 2018-02-01 ENCOUNTER — Encounter (INDEPENDENT_AMBULATORY_CARE_PROVIDER_SITE_OTHER): Payer: Self-pay | Admitting: Orthopaedic Surgery

## 2018-02-01 DIAGNOSIS — M25562 Pain in left knee: Secondary | ICD-10-CM

## 2018-02-01 NOTE — Progress Notes (Signed)
rescheduled

## 2018-02-05 ENCOUNTER — Ambulatory Visit (INDEPENDENT_AMBULATORY_CARE_PROVIDER_SITE_OTHER): Payer: Medicare HMO | Admitting: Orthopaedic Surgery

## 2018-02-05 ENCOUNTER — Ambulatory Visit (INDEPENDENT_AMBULATORY_CARE_PROVIDER_SITE_OTHER): Payer: Medicare HMO

## 2018-02-05 ENCOUNTER — Encounter (INDEPENDENT_AMBULATORY_CARE_PROVIDER_SITE_OTHER): Payer: Self-pay | Admitting: Orthopaedic Surgery

## 2018-02-05 DIAGNOSIS — G8929 Other chronic pain: Secondary | ICD-10-CM | POA: Diagnosis not present

## 2018-02-05 DIAGNOSIS — M25562 Pain in left knee: Secondary | ICD-10-CM | POA: Diagnosis not present

## 2018-02-05 DIAGNOSIS — M1712 Unilateral primary osteoarthritis, left knee: Secondary | ICD-10-CM | POA: Diagnosis not present

## 2018-02-05 MED ORDER — METHYLPREDNISOLONE ACETATE 40 MG/ML IJ SUSP
40.0000 mg | INTRAMUSCULAR | Status: AC | PRN
  Administered 2018-02-05: 40 mg via INTRA_ARTICULAR

## 2018-02-05 MED ORDER — BUPIVACAINE HCL 0.5 % IJ SOLN
2.0000 mL | INTRAMUSCULAR | Status: AC | PRN
  Administered 2018-02-05: 2 mL via INTRA_ARTICULAR

## 2018-02-05 MED ORDER — LIDOCAINE HCL 1 % IJ SOLN
2.0000 mL | INTRAMUSCULAR | Status: AC | PRN
  Administered 2018-02-05: 2 mL

## 2018-02-05 NOTE — Progress Notes (Signed)
Office Visit Note   Patient: [name]           Date of Birth: 21-Mar-1921           MRN: [id number] Visit Date: 02/05/2018              Requested by: [name], MD [place], [place] [address] PCP: [name], MD   Assessment & Plan: Visit Diagnoses:  1. Unilateral primary osteoarthritis, left knee   2. Chronic pain of left knee     Plan: Impression is tricompartmental degenerative joint disease left knee.  Cortisone injection performed today.  Questions encouraged and answered.  Follow-up as needed. Total face to face encounter time was greater than 45 minutes and over half of this time was spent in counseling and/or coordination of care.  Follow-Up Instructions: Return if symptoms worsen or fail to improve.   Orders:  Orders Placed This Encounter  Procedures  . XR KNEE 3 VIEW LEFT   No orders of the defined types were placed in this encounter.     Procedures: Large Joint Inj: L knee on 02/05/2018 10:05 AM Details: 22 G needle Medications: 2 mL bupivacaine 0.5 %; 2 mL lidocaine 1 %; 40 mg methylPREDNISolone acetate 40 MG/ML Outcome: tolerated well, no immediate complications Patient was prepped and draped in the usual sterile fashion.       Clinical Data: No additional findings.   Subjective: Chief Complaint  Patient presents with  . Left Knee - Pain    Patient is a 84 year old gentleman who comes in with left knee pain.  He is deaf.  He states that he has had 6 months of chronic pain that radiates down the leg.  Denies any injuries.  The pain is worse when walking and being active.  He has stiffness.  Denies any numbness and tingling.   Review of Systems  Constitutional: Negative.   All other systems reviewed and are negative.    Objective: Vital Signs: There were no vitals taken for this visit.  Physical Exam  Constitutional: He is oriented to person, place, and time. He appears well-developed and  well-nourished.  HENT:  Head: Normocephalic and atraumatic.  Eyes: Pupils are equal, round, and reactive to light.  Neck: Neck supple.  Pulmonary/Chest: Effort normal.  Abdominal: Soft.  Musculoskeletal: Normal range of motion.  Neurological: He is alert and oriented to person, place, and time.  Skin: Skin is warm.  Psychiatric: He has a normal mood and affect. His behavior is normal. Judgment and thought content normal.  Nursing note and vitals reviewed.   Ortho Exam Left knee exam shows no joint effusion.  Collaterals and cruciates are stable. Specialty Comments:  No specialty comments available.  Imaging: Xr Knee 3 View Left  Result Date: 02/05/2018 Advanced tricompartment degenerative joint disease    PMFS History: Patient Active Problem List   Diagnosis Date Noted  . Insomnia 07/06/2017  . Pain in right ankle and joints of right foot 08/15/2016  . Diastolic CHF ([place]) 35/36/1443  . Constipation 06/16/2010  . GERD 08/04/2009  . Prostate cancer ([place]) 08/21/2008  . Umbilical hernia 15/40/0867  . Hiatal hernia 02/13/2008  . DEGENERATIVE DISC DISEASE, CERVICAL SPINE 02/13/2008  . Diabetes mellitus type II, uncontrolled ([place]) 06/04/2007  . Hyperlipidemia 06/04/2007  . Essential hypertension 06/04/2007  . CAD (coronary artery disease) 06/04/2007   Past Medical History:  Diagnosis Date  . Arthritis   . CAD (coronary artery disease)   .  CAD (coronary artery disease)    post diaphragjatic wall infarction and subsequent coronary artery bypass graft surgery in 2003, now stable  . Diabetes mellitus   . Elevated PSA   . GERD (gastroesophageal reflux disease)   . Hemorrhoids   . Hx of adenomatous colonic polyps   . Hyperlipidemia   . Hypertension   . Prostate cancer [facility])     Family History  Problem Relation Age of Onset  . Heart disease Mother   . Diabetes Mother     Past Surgical History:  Procedure Laterality Date  . CORONARY ARTERY BYPASS GRAFT     Social  History   Occupational History  . Occupation: Retired    Fish farm manager: [employer]  Tobacco Use  . Smoking status: Former Smoker    Last attempt to quit: 10/25/1948    Years since quitting: 69.3  . Smokeless tobacco: Never Used  Substance and Sexual Activity  . Alcohol use: No  . Drug use: No  . Sexual activity: Not Currently

## 2018-02-08 ENCOUNTER — Encounter: Payer: Self-pay | Admitting: Family Medicine

## 2018-02-08 ENCOUNTER — Ambulatory Visit: Payer: Self-pay | Admitting: *Deleted

## 2018-02-08 ENCOUNTER — Ambulatory Visit (INDEPENDENT_AMBULATORY_CARE_PROVIDER_SITE_OTHER): Payer: Medicare HMO

## 2018-02-08 ENCOUNTER — Ambulatory Visit (INDEPENDENT_AMBULATORY_CARE_PROVIDER_SITE_OTHER): Payer: Medicare HMO | Admitting: Family Medicine

## 2018-02-08 VITALS — BP 128/60 | HR 85 | Temp 98.4°F | Wt 227.0 lb

## 2018-02-08 DIAGNOSIS — R079 Chest pain, unspecified: Secondary | ICD-10-CM | POA: Diagnosis not present

## 2018-02-08 DIAGNOSIS — J9811 Atelectasis: Secondary | ICD-10-CM | POA: Diagnosis not present

## 2018-02-08 MED ORDER — TRAMADOL HCL 50 MG PO TABS: 25.0000 mg | ORAL_TABLET | Freq: Three times a day (TID) | ORAL | 0 refills | Status: DC | PRN

## 2018-02-08 NOTE — Progress Notes (Signed)
    Subjective:  [name] is a 84 y.o. male who presents today for same-day appointment with a chief complaint of chest pain.   HPI:  Chest Pain, Acute Issue Symptoms started yesterday.  Stable over that time.  Located along his right side of his chest.  Patient was in his normal state of health yesterday when he tripped and fell over a rug at his house.  He fell onto his knee and then his elbow.  After getting up started noticing pain along the right side of his chest.  Denies any preceding chest pain or shortness of breath.  Denies any syncopal episodes.  No palpitations.  Pain is worse with deep inspiration.  No nausea or vomiting.  No treatments tried.  ROS: Per HPI  PMH: He reports that he quit smoking about 69 years ago. He has never used smokeless tobacco. He reports that he does not drink alcohol or use drugs.  Objective:  Physical Exam: BP 128/60 (BP Location: Left Arm)   Pulse 85   Temp 98.4 F (36.9 C) (Oral)   Wt 227 lb (103 kg)   SpO2 95%   BMI 34.52 kg/m   Gen: NAD, resting comfortably CV: RRR with no murmurs appreciated Pulm: NWOB, CTAB with no crackles, wheezes, or rhonchi Chest: No deformities.  Tender to palpation along right lateral chest wall.  EKG: NSR. Old RBBB. TWI in III that is stable.  No acute ischemic changes.   Assessment/Plan:  Chest wall pain Patient has subtle findings of cortical irregularities along anterior and posterior ribs on his right side based on my read of his chest x-ray.  Will await radiology read.  His EKG today does not have any acute findings.  Symptoms are not consistent with cardiac or pulmonary etiologies.  We will treat him for presumptive rib fracture with low-dose tramadol.  His respiratory exam is stable and he is satting at 95% on room air. Discussed reasons to seek emergent care including worsening chest pain, shortness of breath, fevers, etc. Recommended that he will follow-up with his PCP early next week to ensure  improvement of his symptoms and to watch for development of any complications.   Algis Greenhouse. Jerline Pain, MD 02/08/2018 5:31 PM

## 2018-02-08 NOTE — Telephone Encounter (Signed)
See note

## 2018-02-08 NOTE — Telephone Encounter (Signed)
Noted  

## 2018-02-08 NOTE — Patient Instructions (Signed)
I think you have a rib fracture.  Please start the tramadol.  If your pain worsens, or if you start having worsening shortness of breath or any other symptoms, please let us know.  Please come back next week for a recheck to make sure your pain is getting better.  Take care, Dr Jerline Pain

## 2018-02-08 NOTE — Telephone Encounter (Signed)
Pt fell yesterday, tripped on a rug at his house. He hit his knee first and then his elbow. He said that he pulled his chest muscle trying to get up and that is what is causing his chest pain. No c/o cardiac symptoms. He has requested an appointment for today, already made. He said that he can drive himself. Advised that if he cant to get someone to take him to the office. Advised him to take pain med for his chest discomfort and to call back if he starts feeling worst. He stated he would.   Answer Assessment - Initial Assessment Questions 1. ONSET: "When did the muscle aches or body pains start?"      yesterday 2. LOCATION: "What part of your body is hurting?" (e.g., entire body, arms, legs)      chest 3. SEVERITY: "How bad is the pain?" (Scale 1-10; or mild, moderate, severe)   - MILD (1-3): doesn't interfere with normal activities    - MODERATE (4-7): interferes with normal activities or awakens from sleep    - SEVERE (8-10):  excruciating pain, unable to do any normal activities      #10 4. CAUSE: "What do you think is causing the pains?"     Straining from trying to get up 5. FEVER: "Have you been having fever?"     no 6. OTHER SYMPTOMS: "Do you have any other symptoms?" (e.g., chest pain, weakness, rash, cold or flu symptoms, weight loss)     no 7. PREGNANCY: "Is there any chance you are pregnant?" "When was your last menstrual period?"     no 8. TRAVEL: "Have you traveled out of the country in the last month?" (e.g., travel history, exposures)     no  Protocols used: MUSCLE ACHES AND BODY PAIN-A-AH

## 2018-02-13 NOTE — Progress Notes (Signed)
Sent letter

## 2018-03-08 DIAGNOSIS — H903 Sensorineural hearing loss, bilateral: Secondary | ICD-10-CM | POA: Diagnosis not present

## 2018-03-08 DIAGNOSIS — H9190 Unspecified hearing loss, unspecified ear: Secondary | ICD-10-CM | POA: Diagnosis not present

## 2018-04-09 ENCOUNTER — Encounter: Payer: Self-pay | Admitting: Family Medicine

## 2018-04-09 ENCOUNTER — Ambulatory Visit (INDEPENDENT_AMBULATORY_CARE_PROVIDER_SITE_OTHER): Payer: Medicare HMO | Admitting: Family Medicine

## 2018-04-09 ENCOUNTER — Other Ambulatory Visit: Payer: Self-pay | Admitting: Family Medicine

## 2018-04-09 VITALS — BP 138/80 | HR 84 | Temp 98.1°F | Ht 68.0 in | Wt 231.0 lb

## 2018-04-09 DIAGNOSIS — I10 Essential (primary) hypertension: Secondary | ICD-10-CM

## 2018-04-09 DIAGNOSIS — Z794 Long term (current) use of insulin: Secondary | ICD-10-CM | POA: Diagnosis not present

## 2018-04-09 DIAGNOSIS — I5032 Chronic diastolic (congestive) heart failure: Secondary | ICD-10-CM | POA: Diagnosis not present

## 2018-04-09 DIAGNOSIS — E785 Hyperlipidemia, unspecified: Secondary | ICD-10-CM

## 2018-04-09 DIAGNOSIS — E119 Type 2 diabetes mellitus without complications: Secondary | ICD-10-CM | POA: Diagnosis not present

## 2018-04-09 LAB — POCT GLYCOSYLATED HEMOGLOBIN (HGB A1C): Hemoglobin A1C: 6.6 % — AB (ref 4.0–5.6)

## 2018-04-09 MED ORDER — INSULIN NPH ISOPHANE & REGULAR (70-30) 100 UNIT/ML ~~LOC~~ SUSP: SUBCUTANEOUS | 5 refills | Status: DC

## 2018-04-09 NOTE — Assessment & Plan Note (Signed)
S: reasoably controlled on nph/r 25--> 20 units AM (he states he actually was only taking 20 units at last visit as well) and 18 PM with metformin 500mg  BID- a1c goal 8 or less reasonably. Denies lows.  Lab Results  Component Value Date   HGBA1C 6.6 (A) 04/09/2018   HGBA1C 7.5 (H) 12/08/2017   HGBA1C 7.9 (H) 07/21/2017   A/P: Continue current medications.  Follow-up in 4 months for a physical.  I do wonder if A1c is falsely low-patient reports poor dietary intake and request diabetes education referral-referred today and set up for Friday.  They were kind to accommodate [place] since it would be difficult to give him referral information by phone.

## 2018-04-09 NOTE — Assessment & Plan Note (Signed)
S: controlled on  benazepril 40mg , hctz 25mg  BP Readings from Last 3 Encounters:  04/09/18 138/80  02/08/18 128/60  12/08/17 128/72  A/P: We discussed blood pressure goal of <140/90. Continue current meds

## 2018-04-09 NOTE — Patient Instructions (Addendum)
Health Maintenance Due  Topic Date Due  . TETANUS/TDAP - Informed patient to have it done at his pharmacy. 10/25/2015  . FOOT EXAM - Due to compression socks, we will do this at next office visit. 03/23/2018   a1c looks great at 6.6. We will refer you to diabetes education per your request with weight gain. Lanny Hurst was able to get you in for Friday!!!! See sheet.   I'm worried the extra weight and swelling could actually be coming from your heart. I want you to try to avoid excess salt- see low salt diet below  If you gain another 3 lbs in a day or 5 lbs in 3 days see [place] back immediately- we may have to start lasix back. If you get short of breath see [place] back immediately as well.    Team- please send in new prescription for nph/r with 20 units in the morning and 18 units in the evening  Also help set him up for 4 months physical and come fasting  Low-Sodium Eating Plan Sodium, which is an element that makes up salt, helps you maintain a healthy balance of fluids in your body. Too much sodium can increase your blood pressure and cause fluid and waste to be held in your body. Your health care provider or dietitian may recommend following this plan if you have high blood pressure (hypertension), kidney disease, liver disease, or heart failure. Eating less sodium can help lower your blood pressure, reduce swelling, and protect your heart, liver, and kidneys. What are tips for following this plan? General guidelines  Most people on this plan should limit their sodium intake to 1,500-2,000 mg (milligrams) of sodium each day. Reading food labels  The Nutrition Facts label lists the amount of sodium in one serving of the food. If you eat more than one serving, you must multiply the listed amount of sodium by the number of servings.  Choose foods with less than 140 mg of sodium per serving.  Avoid foods with 300 mg of sodium or more per serving. Shopping  Look for lower-sodium products, often  labeled as "low-sodium" or "no salt added."  Always check the sodium content even if foods are labeled as "unsalted" or "no salt added".  Buy fresh foods. ? Avoid canned foods and premade or frozen meals. ? Avoid canned, cured, or processed meats  Buy breads that have less than 80 mg of sodium per slice. Cooking  Eat more home-cooked food and less restaurant, buffet, and fast food.  Avoid adding salt when cooking. Use salt-free seasonings or herbs instead of table salt or sea salt. Check with your health care provider or pharmacist before using salt substitutes.  Cook with plant-based oils, such as canola, sunflower, or olive oil. Meal planning  When eating at a restaurant, ask that your food be prepared with less salt or no salt, if possible.  Avoid foods that contain MSG (monosodium glutamate). MSG is sometimes added to [place] food, bouillon, and some canned foods. What foods are recommended? The items listed may not be a complete list. Talk with your dietitian about what dietary choices are best for you. Grains Whole-grain breads and whole-grain pasta. Vegetables Fresh or frozen vegetables. "No salt added" canned vegetables. "No salt added" tomato sauce and paste.  Fruits Fresh, frozen, or canned fruit.  Meats and other protein foods Fresh or frozen (no salt added) meat, poultry, seafood, and fish. Low-sodium canned tuna and salmon. Unsalted nuts. Dried peas, beans, and lentils without added  salt. Unsalted canned beans. Eggs. Unsalted nut butters. Dairy Milk. Soy milk. Cheese that is naturally low in sodium, such as ricotta cheese, fresh mozzarella, or Swiss cheese Low-sodium or reduced-sodium cheese. Cream cheese. Yogurt. Fats and oils Unsalted butter. Unsalted margarine with no trans fat. Vegetable oils such as canola or olive oils. Seasonings and other foods Fresh and dried herbs and spices. Salt-free seasonings. Low-sodium mustard and ketchup. Sodium-free salad dressing.  Sodium-free light mayonnaise. Fresh or refrigerated horseradish. Lemon juice. Vinegar. Homemade, reduced-sodium, or low-sodium soups. Unsalted popcorn and pretzels. Low-salt or salt-free chips. What foods are not recommended? The items listed may not be a complete list. Talk with your dietitian about what dietary choices are best for you. Grains Instant hot cereals. Bread stuffing, pancake, and biscuit mixes. Croutons. Seasoned rice or pasta mixes. Noodle soup cups. Boxed or frozen macaroni and cheese. Regular salted crackers. Self-rising flour. Vegetables Sauerkraut, pickled vegetables, and relishes. Olives. [place] fries. Onion rings. Regular canned vegetables (not low-sodium or reduced-sodium). Regular canned tomato sauce and paste (not low-sodium or reduced-sodium). Regular tomato and vegetable juice (not low-sodium or reduced-sodium). Frozen vegetables in sauces. Meats and other protein foods Meat or fish that is salted, canned, smoked, spiced, or pickled. Bacon, ham, sausage, hotdogs, corned beef, chipped beef, packaged lunch meats, salt pork, jerky, pickled herring, anchovies, regular canned tuna, sardines, salted nuts. Dairy Processed cheese and cheese spreads. Cheese curds. Blue cheese. Feta cheese. String cheese. Regular cottage cheese. Buttermilk. Canned milk. Fats and oils Salted butter. Regular margarine. Ghee. Bacon fat. Seasonings and other foods Onion salt, garlic salt, seasoned salt, table salt, and sea salt. Canned and packaged gravies. Worcestershire sauce. Tartar sauce. Barbecue sauce. Teriyaki sauce. Soy sauce, including reduced-sodium. Steak sauce. Fish sauce. Oyster sauce. Cocktail sauce. Horseradish that you find on the shelf. Regular ketchup and mustard. Meat flavorings and tenderizers. Bouillon cubes. Hot sauce and Tabasco sauce. Premade or packaged marinades. Premade or packaged taco seasonings. Relishes. Regular salad dressings. Salsa. Potato and tortilla chips. Corn chips  and puffs. Salted popcorn and pretzels. Canned or dried soups. Pizza. Frozen entrees and pot pies. Summary  Eating less sodium can help lower your blood pressure, reduce swelling, and protect your heart, liver, and kidneys.  Most people on this plan should limit their sodium intake to 1,500-2,000 mg (milligrams) of sodium each day.  Canned, boxed, and frozen foods are high in sodium. Restaurant foods, fast foods, and pizza are also very high in sodium. You also get sodium by adding salt to food.  Try to cook at home, eat more fresh fruits and vegetables, and eat less fast food, canned, processed, or prepared foods. This information is not intended to replace advice given to you by your health care provider. Make sure you discuss any questions you have with your health care provider. Document Released: 04/01/2002 Document Revised: 10/03/2016 Document Reviewed: 10/03/2016 Elsevier Interactive Patient Education  [employer].

## 2018-04-09 NOTE — Assessment & Plan Note (Signed)
Patient's weight is up 4 pounds today.  He states he has been eating poorly with high salt and sugar intake.  I was actually considering changing diastolic CHF to simply diastolic dysfunction.  With that being said-his edema has increased and weight is up.  He is no longer on Lasix as he has been in the past.  We discussed if edema worsens, shortness of breath started, weight continued to increase he would need to be seen immediately.  For now we focused on a low-salt diet in addition to diabetes education-hopefully this will help him make healthier food choices.

## 2018-04-09 NOTE — Assessment & Plan Note (Signed)
S: reasonably controlled on lovastatin 40mg  with LDL 79 in september  A/P: continue current rx

## 2018-04-09 NOTE — Progress Notes (Signed)
Subjective:  [name] is a 84 y.o. year old very pleasant male patient who presents for/with See problem oriented charting ROS-hard of hearing/deaf.  Difficulty with urination requiring catheterization.  No chest pain or shortness of breath reported  Past Medical History-  Patient Active Problem List   Diagnosis Date Noted  . Diastolic CHF ([place]) 42/70/6237    Priority: High  . Prostate cancer ([place]) 08/21/2008    Priority: High  . Diabetes mellitus type II, uncontrolled ([place]) 06/04/2007    Priority: High  . CAD (coronary artery disease) 06/04/2007    Priority: High  . Hyperlipidemia 06/04/2007    Priority: Medium  . Essential hypertension 06/04/2007    Priority: Medium  . Constipation 06/16/2010    Priority: Low  . GERD 08/04/2009    Priority: Low  . Umbilical hernia 62/83/1517    Priority: Low  . Hiatal hernia 02/13/2008    Priority: Low  . DEGENERATIVE DISC DISEASE, CERVICAL SPINE 02/13/2008    Priority: Low  . Insomnia 07/06/2017  . Pain in right ankle and joints of right foot 08/15/2016    Medications- reviewed and updated Current Outpatient Medications  Medication Sig Dispense Refill  . ACCU-CHEK AVIVA PLUS test strip USE TO TEST 2 TIMES DAILY 200 each 3  . ACCU-CHEK SOFTCLIX LANCETS lancets USE  TO  TEST TWICE DAILY 200 each 3  . Alcohol Swabs (B-D SINGLE USE SWABS REGULAR) PADS     . aspirin (ECOTRIN) 325 MG EC tablet Take 325 mg by mouth daily.      . benazepril (LOTENSIN) 40 MG tablet TAKE 1 TABLET EVERY DAY 90 tablet 3  . hydrochlorothiazide (HYDRODIURIL) 25 MG tablet Take 1 tablet (25 mg total) by mouth daily. 90 tablet 3  . insulin NPH-regular Human (NOVOLIN 70/30) (70-30) 100 UNIT/ML injection Inject 20 units into the skin each morning and 18 units into the skin each evening. 10 mL 5  . Insulin Pen Needle (BD PEN NEEDLE NANO U/F) 32G X 4 MM MISC USE TWICE DAILY. DX: E11.9 90 each 11  . Insulin Syringe-Needle U-100 25G X 5/8" 1 ML MISC Use twice a day.  May substitute as needed for relion insulin 70/30. 100 each 5  . lovastatin (MEVACOR) 40 MG tablet Take 1 tablet (40 mg total) by mouth at bedtime. 90 tablet 3  . metFORMIN (GLUCOPHAGE) 500 MG tablet Take 1 tablet (500 mg total) by mouth 2 (two) times daily with a meal. 180 tablet 3  . traMADol (ULTRAM) 50 MG tablet Take 0.5 tablets (25 mg total) by mouth every 8 (eight) hours as needed. 15 tablet 0  . traZODone (DESYREL) 50 MG tablet Take 0.5-1 tablets (25-50 mg total) by mouth at bedtime as needed for sleep. 30 tablet 5  . vitamin B-12 (CYANOCOBALAMIN) 100 MCG tablet Take 50 mcg by mouth daily.      . vitamin C (ASCORBIC ACID) 500 MG tablet Take 500 mg by mouth daily.       No current facility-administered medications for this visit.     Objective: BP 138/80 (BP Location: Left Arm, Patient Position: Sitting, Cuff Size: Normal)   Pulse 84   Temp 98.1 F (36.7 C) (Oral)   Ht 5\' 8"  (1.727 m)   Wt 231 lb (104.8 kg)   SpO2 96%   BMI 35.12 kg/m  Gen: NAD, resting comfortably CV: RRR no murmurs rubs or gallops Lungs: CTAB no crackles, wheeze, rhonchi Abdomen: soft/nontender/nondistended/normal bowel sounds.  Ext: 1+ edema under compression stockings  Skin: warm, dry Neuro: deaf- speaks very loudly . Nonfunctioning cochlear implant.   Assessment/Plan:  Other notes: 1. last visit- was sleeping reasonably well on trazodone 2. Continues to self cath 3-4 x a day and does not desire further workup for prostate cancer  Essential hypertension S: controlled on  benazepril 40mg , hctz 25mg  BP Readings from Last 3 Encounters:  04/09/18 138/80  02/08/18 128/60  12/08/17 128/72  A/P: We discussed blood pressure goal of <140/90. Continue current meds  Hyperlipidemia S: reasonably controlled on lovastatin 40mg  with LDL 79 in september  A/P: continue current rx  Diabetes mellitus type II, uncontrolled ([place]) S: reasoably controlled on nph/r 25--> 20 units AM (he states he actually was only  taking 20 units at last visit as well) and 18 PM with metformin 500mg  BID- a1c goal 8 or less reasonably. Denies lows.  Lab Results  Component Value Date   HGBA1C 6.6 (A) 04/09/2018   HGBA1C 7.5 (H) 12/08/2017   HGBA1C 7.9 (H) 07/21/2017   A/P: Continue current medications.  Follow-up in 4 months for a physical.  I do wonder if A1c is falsely low-patient reports poor dietary intake and request diabetes education referral-referred today and set up for Friday.  They were kind to accommodate [place] since it would be difficult to give him referral information by phone.  Diastolic CHF ([place]) Patient's weight is up 4 pounds today.  He states he has been eating poorly with high salt and sugar intake.  I was actually considering changing diastolic CHF to simply diastolic dysfunction.  With that being said-his edema has increased and weight is up.  He is no longer on Lasix as he has been in the past.  We discussed if edema worsens, shortness of breath started, weight continued to increase he would need to be seen immediately.  For now we focused on a low-salt diet in addition to diabetes education-hopefully this will help him make healthier food choices.   Future Appointments  Date Time Provider [place]  04/13/2018  9:30 AM Anne Shutter, [place] [facility]  08/13/2018  9:30 AM [name], MD [facility]   Return in about 4 months (around 08/09/2018) for physical.  Lab/Order associations: Uncontrolled type 2 diabetes mellitus with hyperglycemia ([place]) - Plan: POCT glycosylated hemoglobin (Hb A1C), Ambulatory referral to diabetic education  Meds ordered this encounter  Medications  . insulin NPH-regular Human (NOVOLIN 70/30) (70-30) 100 UNIT/ML injection    Sig: Inject 20 units into the skin each morning and 18 units into the skin each evening.    Dispense:  10 mL    Refill:  5   Return precautions advised.  [name], MD

## 2018-04-13 ENCOUNTER — Ambulatory Visit: Payer: Medicare HMO | Admitting: *Deleted

## 2018-07-17 ENCOUNTER — Other Ambulatory Visit: Payer: Self-pay | Admitting: Family Medicine

## 2018-08-13 ENCOUNTER — Ambulatory Visit (INDEPENDENT_AMBULATORY_CARE_PROVIDER_SITE_OTHER): Payer: Medicare HMO | Admitting: Family Medicine

## 2018-08-13 ENCOUNTER — Encounter: Payer: Self-pay | Admitting: Family Medicine

## 2018-08-13 VITALS — BP 114/76 | HR 81 | Temp 98.5°F | Ht 68.0 in | Wt 233.0 lb

## 2018-08-13 DIAGNOSIS — Z0000 Encounter for general adult medical examination without abnormal findings: Secondary | ICD-10-CM | POA: Diagnosis not present

## 2018-08-13 DIAGNOSIS — I2510 Atherosclerotic heart disease of native coronary artery without angina pectoris: Secondary | ICD-10-CM

## 2018-08-13 DIAGNOSIS — E1159 Type 2 diabetes mellitus with other circulatory complications: Secondary | ICD-10-CM | POA: Diagnosis not present

## 2018-08-13 DIAGNOSIS — Z23 Encounter for immunization: Secondary | ICD-10-CM

## 2018-08-13 DIAGNOSIS — I10 Essential (primary) hypertension: Secondary | ICD-10-CM

## 2018-08-13 DIAGNOSIS — C61 Malignant neoplasm of prostate: Secondary | ICD-10-CM

## 2018-08-13 DIAGNOSIS — E785 Hyperlipidemia, unspecified: Secondary | ICD-10-CM | POA: Diagnosis not present

## 2018-08-13 DIAGNOSIS — F5101 Primary insomnia: Secondary | ICD-10-CM | POA: Diagnosis not present

## 2018-08-13 DIAGNOSIS — Z794 Long term (current) use of insulin: Secondary | ICD-10-CM | POA: Diagnosis not present

## 2018-08-13 DIAGNOSIS — E1169 Type 2 diabetes mellitus with other specified complication: Secondary | ICD-10-CM | POA: Diagnosis not present

## 2018-08-13 DIAGNOSIS — I5032 Chronic diastolic (congestive) heart failure: Secondary | ICD-10-CM | POA: Diagnosis not present

## 2018-08-13 LAB — COMPREHENSIVE METABOLIC PANEL
ALT: 8 U/L (ref 0–53)
AST: 10 U/L (ref 0–37)
Albumin: 3.7 g/dL (ref 3.5–5.2)
Alkaline Phosphatase: 45 U/L (ref 39–117)
BUN: 21 mg/dL (ref 6–23)
CO2: 26 mEq/L (ref 19–32)
Calcium: 9.2 mg/dL (ref 8.4–10.5)
Chloride: 104 mEq/L (ref 96–112)
Creatinine, Ser: 1.25 mg/dL (ref 0.40–1.50)
GFR: 68.91 mL/min (ref >60.00)
Glucose, Bld: 217 mg/dL — ABNORMAL HIGH (ref 70–99)
Potassium: 4.6 mEq/L (ref 3.5–5.1)
Sodium: 138 mEq/L (ref 135–145)
Total Bilirubin: 0.4 mg/dL (ref 0.2–1.2)
Total Protein: 6.4 g/dL (ref 6.0–8.3)

## 2018-08-13 LAB — CBC
HCT: 35 % — ABNORMAL LOW (ref 39.0–52.0)
Hemoglobin: 11.7 g/dL — ABNORMAL LOW (ref 13.0–17.0)
MCHC: 33.3 g/dL (ref 30.0–36.0)
MCV: 93.2 fl (ref 78.0–100.0)
Platelets: 357 10*3/uL (ref 150.0–400.0)
RBC: 3.76 Mil/uL — ABNORMAL LOW (ref 4.22–5.81)
RDW: 14.1 % (ref 11.5–15.5)
WBC: 6.7 10*3/uL (ref 4.0–10.5)

## 2018-08-13 LAB — LIPID PANEL
Cholesterol: 121 mg/dL (ref 0–200)
HDL: 42.1 mg/dL (ref >39.00)
LDL Cholesterol: 46 mg/dL (ref 0–99)
NonHDL: 78.9
Total CHOL/HDL Ratio: 3
Triglycerides: 163 mg/dL — ABNORMAL HIGH (ref 0.0–149.0)
VLDL: 32.6 mg/dL (ref 0.0–40.0)

## 2018-08-13 LAB — HEMOGLOBIN A1C: Hgb A1c MFr Bld: 7.5 % — ABNORMAL HIGH (ref 4.6–6.5)

## 2018-08-13 MED ORDER — HYDROCHLOROTHIAZIDE 25 MG PO TABS: 25.0000 mg | ORAL_TABLET | Freq: Every day | ORAL | 3 refills | Status: DC

## 2018-08-13 MED ORDER — TRAZODONE HCL 50 MG PO TABS: 25.0000 mg | ORAL_TABLET | Freq: Every evening | ORAL | 5 refills | Status: DC | PRN

## 2018-08-13 NOTE — Progress Notes (Signed)
Phone: [phone]  Subjective:  Patient presents today for their annual physical. Chief complaint-noted.   See problem oriented charting- ROS- full  review of systems was completed and negative except for: hearing loss, eye itching, urinary frequency.  Denies chest pain or shortness of breath  The following were reviewed and entered/updated in epic: Past Medical History:  Diagnosis Date  . Arthritis   . CAD (coronary artery disease)   . CAD (coronary artery disease)    post diaphragjatic wall infarction and subsequent coronary artery bypass graft surgery in 2003, now stable  . Diabetes mellitus   . Diastolic CHF ([place]) 9/41/7408   02/2013 Echo Grade I diastoilc dysfunction. Off lasix for several months 40mg - 04/14/16. Continue to monitor  . Elevated PSA   . GERD (gastroesophageal reflux disease)   . Hemorrhoids   . Hx of adenomatous colonic polyps   . Hyperlipidemia   . Hypertension   . Prostate cancer [facility])    Patient Active Problem List   Diagnosis Date Noted  . Diastolic dysfunction 14/48/1856    Priority: High  . Prostate cancer ([place]) 08/21/2008    Priority: High  . Diabetes mellitus type II, controlled ([place]) 06/04/2007    Priority: High  . CAD (coronary artery disease) 06/04/2007    Priority: High  . Insomnia 07/06/2017    Priority: Medium  . Hyperlipidemia associated with type 2 diabetes mellitus ([place]) 06/04/2007    Priority: Medium  . Hypertension associated with diabetes ([place]) 06/04/2007    Priority: Medium  . Constipation 06/16/2010    Priority: Low  . GERD 08/04/2009    Priority: Low  . Umbilical hernia 31/49/7026    Priority: Low  . Hiatal hernia 02/13/2008    Priority: Low  . DEGENERATIVE DISC DISEASE, CERVICAL SPINE 02/13/2008    Priority: Low  . Pain in right ankle and joints of right foot 08/15/2016   Past Surgical History:  Procedure Laterality Date  . CORONARY ARTERY BYPASS GRAFT      Family History  Problem Relation Age of Onset  . Heart  disease Mother   . Diabetes Mother     Medications- reviewed and updated Current Outpatient Medications  Medication Sig Dispense Refill  . ACCU-CHEK AVIVA PLUS test strip USE TO TEST 2 TIMES DAILY 200 each 3  . ACCU-CHEK SOFTCLIX LANCETS lancets USE  TO  TEST TWICE DAILY 200 each 3  . Alcohol Swabs (B-D SINGLE USE SWABS REGULAR) PADS     . aspirin (ECOTRIN) 325 MG EC tablet Take 325 mg by mouth daily.      . benazepril (LOTENSIN) 40 MG tablet TAKE 1 TABLET EVERY DAY 90 tablet 3  . hydrochlorothiazide (HYDRODIURIL) 25 MG tablet Take 1 tablet (25 mg total) by mouth daily. 90 tablet 3  . insulin NPH-regular Human (NOVOLIN 70/30) (70-30) 100 UNIT/ML injection Inject 20 units into the skin each morning and 18 units into the skin each evening. 10 mL 5  . Insulin Pen Needle (BD PEN NEEDLE NANO U/F) 32G X 4 MM MISC USE TWICE DAILY. DX: E11.9 90 each 11  . Insulin Syringe-Needle U-100 25G X 5/8" 1 ML MISC Use twice a day. May substitute as needed for relion insulin 70/30. 100 each 5  . lovastatin (MEVACOR) 40 MG tablet Take 1 tablet (40 mg total) by mouth at bedtime. 90 tablet 3  . metFORMIN (GLUCOPHAGE) 500 MG tablet Take 1 tablet (500 mg total) by mouth 2 (two) times daily with a meal. 180 tablet 3  .  vitamin B-12 (CYANOCOBALAMIN) 100 MCG tablet Take 50 mcg by mouth daily.      . vitamin C (ASCORBIC ACID) 500 MG tablet Take 500 mg by mouth daily.      . traZODone (DESYREL) 50 MG tablet Take 0.5-1 tablets (25-50 mg total) by mouth at bedtime as needed for sleep. (Patient not taking: Reported on 08/13/2018) 30 tablet 5   No current facility-administered medications for this visit.     Allergies-reviewed and updated No Known Allergies  Social History   Social History Narrative   Divorced. 4 children. 7 grandkids. 2 greatgrandkids.       Retired from working at [employer] in [employer]: go to gym 3 days a week, tv and sports    Objective: BP 114/76 (BP Location: Left Arm,  Patient Position: Sitting, Cuff Size: Large)   Pulse 81   Temp 98.5 F (36.9 C) (Oral)   Ht 5\' 8"  (1.727 m)   Wt 233 lb (105.7 kg)   SpO2 98%   BMI 35.43 kg/m  Gen: NAD, resting comfortably HEENT: Mucous membranes are moist. Oropharynx normal Neck: no thyromegaly CV: RRR no murmurs rubs or gallops Lungs: CTAB no crackles, wheeze, rhonchi Abdomen: soft/nontender/nondistended/normal bowel sounds. No rebound or guarding.  Ext: 2+ edema Skin: warm, dry Neuro: grossly normal, moves all extremities, PERRLA  Diabetic Foot Exam - Simple   Simple Foot Form Diabetic Foot exam was performed with the following findings:  Yes 08/13/2018  9:57 AM  Visual Inspection No deformities, no ulcerations, no other skin breakdown bilaterally:  Yes Sensation Testing Intact to touch and monofilament testing bilaterally:  Yes Pulse Check Posterior Tibialis and Dorsalis pulse intact bilaterally:  Yes Comments onychomycosis noted bilateral feet on multiple toenails    Assessment/Plan:  84 y.o. male presenting for annual physical.  Health Maintenance counseling: 1. Anticipatory guidance: Patient counseled regarding regular dental exams q6 months, eye exams yearly, wearing seatbelts.  2. Risk factor reduction:  Advised patient of need for regular exercise and diet rich and fruits and vegetables to reduce risk of heart attack and stroke. Exercise- not exercising- worried about balance. Diet-some poor choices- tries to make sure to eat due to diabetes and insulin.  Wt Readings from Last 3 Encounters:  08/13/18 233 lb (105.7 kg)  04/09/18 231 lb (104.8 kg)  02/08/18 227 lb (103 kg)  3. Immunizations/screenings/ancillary studies-advised Tdap at pharmacy.  Flu shot given today.  Immunization History  Administered Date(s) Administered  . Influenza Whole 07/27/2007, 08/27/2009, 06/14/2010  . Influenza, High Dose Seasonal PF 07/08/2016, 07/21/2017, 08/13/2018  . Influenza,inj,Quad PF,6+ Mos 06/23/2015  .  Influenza-Unspecified 07/10/2013, 07/17/2014  . Pneumococcal Conjugate-13 02/20/2015  . Pneumococcal Polysaccharide-23 10/24/2005  . Td 10/24/2005  4. Prostate cancer-patient with known prostate cancer since 2008.  He self catheterizes 3 times a day through [facility] Lab Results  Component Value Date   PSA 17.10 06/01/2017   PSA 8.14 (H) 07/29/2010   5. Colon cancer screening -passed age based screening  Status of chronic or acute concerns   Prostate cancer-see above #4  Coronary artery disease-  S: Compliant with aspirin and statin.  CABG in 2009. Saw cardiology last july A/P: Stable.  Asymptomatic.  Refer back to cardiology for follow up- needs visit this year  Controlled type 2 diabetes mellitus -  S: Reasonably controlled on NPH/R 20 units in the morning and 18 units in the evening along with metformin 500 mg twice a day. Denies lows  A/P: Update A1c-A1c goal 8-8.5 or less is reasonable  Hyperlipidemia associated with type 2 diabetes mellitus-controlled on lovastatin 40 mg.  Update lipids.   Hypertension associated with diabetes-controlled on benazepril 40 mg at night and hydrochlorthiazide 25 mg in the day.  In the past used Lasix for pretibial edema  Primary insomnia-reasonably controlled on trazodone  Osteoarthritis of the knees- low-dose tramadol as needed  Diastolic dysfunction Used to be on Lasix in the past.  No longer requiring.  We will change this to diastolic dysfunction venous insufficiency likely contributes  Offered podiatry to trim nails but he declines.   No future appointments. No follow-ups on file.  Lab/Order associations:  NOT fasting Preventative health care  Chronic diastolic congestive heart failure (HCC)  Prostate cancer (HCC)  Coronary artery disease involving native coronary artery of native heart without angina pectoris  Controlled type 2 diabetes mellitus with other circulatory complication, with long-term current use of insulin  ([place])  Hyperlipidemia associated with type 2 diabetes mellitus ([place])  Hypertension associated with diabetes ([place])  Primary insomnia  Need for prophylactic vaccination and inoculation against influenza - Plan: Flu vaccine HIGH DOSE PF  Meds ordered this encounter  Medications  . hydrochlorothiazide (HYDRODIURIL) 25 MG tablet    Sig: Take 1 tablet (25 mg total) by mouth daily.    Dispense:  90 tablet    Refill:  3    Return precautions advised.  [name], MD

## 2018-08-13 NOTE — Assessment & Plan Note (Signed)
Used to be on Lasix in the past.  No longer requiring.  We will change this to diastolic dysfunction

## 2018-08-13 NOTE — Patient Instructions (Addendum)
advised Tdap at pharmacy.  Flu shot given today.  I would advise you to see your dentist every 6 months  I would advise eye exams yearly- thanks for doing this in march!   Please stop by lab before you go  Great to see you today!  Lets follow-up in 4 to 6 months  I would love it if you would let Roselyn Reef sign you up for a wellness visit as well

## 2018-08-15 ENCOUNTER — Ambulatory Visit: Payer: Medicare HMO | Admitting: Cardiovascular Disease

## 2018-09-12 ENCOUNTER — Ambulatory Visit: Payer: Medicare HMO | Admitting: Physician Assistant

## 2018-09-18 ENCOUNTER — Ambulatory Visit: Payer: Medicare HMO

## 2018-10-10 ENCOUNTER — Other Ambulatory Visit: Payer: Self-pay | Admitting: Family Medicine

## 2018-10-11 ENCOUNTER — Ambulatory Visit: Payer: Medicare HMO

## 2018-10-19 ENCOUNTER — Ambulatory Visit (INDEPENDENT_AMBULATORY_CARE_PROVIDER_SITE_OTHER): Payer: Medicare HMO

## 2018-10-19 VITALS — BP 138/74 | HR 84 | Temp 97.7°F | Ht 68.0 in | Wt 228.4 lb

## 2018-10-19 DIAGNOSIS — Z0000 Encounter for general adult medical examination without abnormal findings: Secondary | ICD-10-CM

## 2018-10-19 NOTE — Progress Notes (Signed)
Subjective:   [name] is a 84 y.o. male who presents for Medicare Annual/Subsequent preventive examination.  Review of Systems:  No ROS.  Medicare Wellness Visit. Additional risk factors are reflected in the social history. Cardiac Risk Factors include: advanced age (>102men, >48 women);male gender;diabetes mellitus Patient lives by himself. He is very hard of hearing but able to communicate through written communication and he can verbally respond to questions or situations. He still attends church.    Patient goes to bed around 10-11pm. He gets up 1 time a night to go to the bathroom. He normally wakes at 7:00am. He does not use a CPAP.  Objective:    Vitals: BP 138/74 (BP Location: Left Arm, Patient Position: Sitting, Cuff Size: Large)   Pulse 84   Temp 97.7 F (36.5 C) (Oral)   Ht 5\' 8"  (1.727 m)   Wt 228 lb 6.4 oz (103.6 kg)   SpO2 98%   BMI 34.73 kg/m   Body mass index is 34.73 kg/m.  Advanced Directives 10/19/2018 03/16/2017  Does Patient Have a Medical Advance Directive? No Yes  Would patient like information on creating a medical advance directive? No - Patient declined -    Tobacco Social History   Tobacco Use  Smoking Status Former Smoker  . Last attempt to quit: 10/25/1948  . Years since quitting: 70.0  Smokeless Tobacco Never Used     Counseling given: Not Answered   Clinical Intake:     Pain : No/denies pain     Diabetes: Yes CBG done?: No Did pt. bring in CBG monitor from home?: No     Interpreter Needed?: No     Past Medical History:  Diagnosis Date  . Arthritis   . CAD (coronary artery disease)   . CAD (coronary artery disease)    post diaphragjatic wall infarction and subsequent coronary artery bypass graft surgery in 2003, now stable  . Diabetes mellitus   . Diastolic CHF ([place]) 0/96/2836   02/2013 Echo Grade I diastoilc dysfunction. Off lasix for several months 40mg - 04/14/16. Continue to monitor  . Elevated PSA   . GERD  (gastroesophageal reflux disease)   . Hemorrhoids   . Hx of adenomatous colonic polyps   . Hyperlipidemia   . Hypertension   . Prostate cancer [facility])    Past Surgical History:  Procedure Laterality Date  . CORONARY ARTERY BYPASS GRAFT     Family History  Problem Relation Age of Onset  . Heart disease Mother   . Diabetes Mother    Social History   Socioeconomic History  . Marital status: Divorced    Spouse name: Not on file  . Number of children: Not on file  . Years of education: Not on file  . Highest education level: Not on file  Occupational History  . Occupation: Retired    Fish farm manager: [employer]  Social Needs  . Financial resource strain: Not on file  . Food insecurity:    Worry: Not on file    Inability: Not on file  . Transportation needs:    Medical: Not on file    Non-medical: Not on file  Tobacco Use  . Smoking status: Former Smoker    Last attempt to quit: 10/25/1948    Years since quitting: 70.0  . Smokeless tobacco: Never Used  Substance and Sexual Activity  . Alcohol use: No  . Drug use: No  . Sexual activity: Not Currently  Lifestyle  . Physical activity:  Days per week: Not on file    Minutes per session: Not on file  . Stress: Not on file  Relationships  . Social connections:    Talks on phone: Not on file    Gets together: Not on file    Attends religious service: Not on file    Active member of club or organization: Not on file    Attends meetings of clubs or organizations: Not on file    Relationship status: Not on file  Other Topics Concern  . Not on file  Social History Narrative   Divorced. 4 children. 7 grandkids. 2 greatgrandkids.       Retired from working at [employer] in [place]in [place]: go to gym 3 days a week, tv and sports    Outpatient Encounter Medications as of 10/19/2018  Medication Sig  . ACCU-CHEK AVIVA PLUS test strip TEST BLOOD SUGAR TWICE DAILY  . ACCU-CHEK SOFTCLIX LANCETS lancets USE   TO  TEST TWICE DAILY  . Alcohol Swabs (B-D SINGLE USE SWABS REGULAR) PADS   . aspirin (ECOTRIN) 325 MG EC tablet Take 325 mg by mouth daily.    . benazepril (LOTENSIN) 40 MG tablet TAKE 1 TABLET EVERY DAY  . hydrochlorothiazide (HYDRODIURIL) 25 MG tablet Take 1 tablet (25 mg total) by mouth daily.  . insulin NPH-regular Human (NOVOLIN 70/30) (70-30) 100 UNIT/ML injection Inject 20 units into the skin each morning and 18 units into the skin each evening.  . Insulin Pen Needle (BD PEN NEEDLE NANO U/F) 32G X 4 MM MISC USE TWICE DAILY. DX: E11.9  . Insulin Syringe-Needle U-100 25G X 5/8" 1 ML MISC Use twice a day. May substitute as needed for relion insulin 70/30.  Marland Kitchen lovastatin (MEVACOR) 40 MG tablet Take 1 tablet (40 mg total) by mouth at bedtime.  . metFORMIN (GLUCOPHAGE) 500 MG tablet Take 1 tablet (500 mg total) by mouth 2 (two) times daily with a meal.  . traZODone (DESYREL) 50 MG tablet Take 0.5-1 tablets (25-50 mg total) by mouth at bedtime as needed for sleep.  . vitamin B-12 (CYANOCOBALAMIN) 100 MCG tablet Take 50 mcg by mouth daily.    . vitamin C (ASCORBIC ACID) 500 MG tablet Take 500 mg by mouth daily.     No facility-administered encounter medications on file as of 10/19/2018.     Activities of Daily Living In your present state of health, do you have any difficulty performing the following activities: 10/19/2018  Hearing? Y  Comment Has Cochlear implants, still hard of hearing  Vision? N  Difficulty concentrating or making decisions? N  Walking or climbing stairs? N  Comment Uses a cane for mobility, goes slowly, uses the handrail  Dressing or bathing? N  Doing errands, shopping? N  Preparing Food and eating ? N  Using the Toilet? N  In the past six months, have you accidently leaked urine? N  Do you have problems with loss of bowel control? N  Managing your Medications? N  Managing your Finances? N  Housekeeping or managing your Housekeeping? N  Some recent data might be  hidden    Patient Care Team: [name], MD as PCP - General (Family Medicine)   Assessment:   This is a routine wellness examination for [name].  Exercise Activities and Dietary recommendations Current Exercise Habits: The patient does not participate in regular exercise at present, Exercise limited by: None identified   I tried to discuss diet with  patient but he would not elaborate. He states he eats 3 meals a day and that he eats "normal foods". I provided patient with Glucerna samples and coupons. I provided written instructions with how to use the Glucerna as a snack or a meal replacement. We discussed how to store the bottles. He verbalized understanding.  Goals    . patient     Go back to the gym;  Would recommend you continue to go. It will help you stay strong and help her diabetes        Fall Risk Fall Risk  10/19/2018 04/09/2018 03/16/2017 02/20/2015 12/13/2013  Falls in the past year? 1 Yes No No No  Number falls in past yr: 1 1 - - -  Injury with Fall? 0 No - - -  Risk for fall due to : History of fall(s);Impaired balance/gait - - - -  Follow up Falls evaluation completed - - - -        Depression Screen PHQ 2/9 Scores 10/19/2018 04/09/2018 03/16/2017 02/20/2015  PHQ - 2 Score 0 0 0 0  PHQ- 9 Score 6 - - -    Cognitive Function        Immunization History  Administered Date(s) Administered  . Influenza Whole 07/27/2007, 08/27/2009, 06/14/2010  . Influenza, High Dose Seasonal PF 07/08/2016, 07/21/2017, 08/13/2018  . Influenza,inj,Quad PF,6+ Mos 06/23/2015  . Influenza-Unspecified 07/10/2013, 07/17/2014  . Pneumococcal Conjugate-13 02/20/2015  . Pneumococcal Polysaccharide-23 10/24/2005  . Td 10/24/2005     Screening Tests Health Maintenance  Topic Date Due  . TETANUS/TDAP  10/25/2015  . OPHTHALMOLOGY EXAM  01/05/2019  . HEMOGLOBIN A1C  02/12/2019  . FOOT EXAM  08/14/2019  . INFLUENZA VACCINE  Completed  . PNA vac Low Risk Adult  Completed              Plan:   Follow UP with PCP as Advised  I have personally reviewed and noted the following in the patient's chart:   . Medical and social history . Use of alcohol, tobacco or illicit drugs  . Current medications and supplements . Functional ability and status . Nutritional status . Physical activity . Advanced directives . List of other physicians . Vitals . Screenings to include cognitive, depression, and falls . Referrals and appointments  In addition, I have reviewed and discussed with patient certain preventive protocols, quality metrics, and best practice recommendations. A written personalized care plan for preventive services as well as general preventive health recommendations were provided to patient.     [place], [place]  48/18/5631

## 2018-10-19 NOTE — Progress Notes (Signed)
I have personally reviewed the Medicare Annual Wellness Visit and agree with the assessment and plan.  Algis Greenhouse. Jerline Pain, MD 10/19/2018 2:17 PM

## 2018-10-19 NOTE — Progress Notes (Signed)
PCP notes: Last OV 08/13/2018   Health maintenance: Tetanus shot-go by the pharmacy and receive   Abnormal screenings: PHQ9 score of 6. Patient states he has no appetite, has trouble falling asleep. He will discuss at his next visit with Dr. Yong Channel. Patient is very hard of hearing. All communication was done via written communication.   Patient concerns: None   Nurse concerns: Discussed patients diet with him and he states he eats 3 meals a day but would not elaborate on food choices. I provided him with Glucerna samples and coupons. I provided written instructions for how to use this as a snack or a meal replacement. Patient verbalized understanding   Next PCP appt: 01/14/2019

## 2018-10-19 NOTE — Patient Instructions (Signed)
[name] , Thank you for taking time to come for your Medicare Wellness Visit. I appreciate your ongoing commitment to your health goals. Please review the following plan we discussed and let me know if I can assist you in the future.   These are the goals we discussed: Goals    . patient     Go back to the gym;  Would recommend you continue to go. It will help you stay strong and help her diabetes        This is a list of the screening recommended for you and due dates:  Health Maintenance  Topic Date Due  . Tetanus Vaccine  10/25/2015  . Eye exam for diabetics  01/05/2019  . Hemoglobin A1C  02/12/2019  . Complete foot exam   08/14/2019  . Flu Shot  Completed  . Pneumonia vaccines  Completed    Preventive Care for Adults  A healthy lifestyle and preventive care can promote health and wellness. Preventive health guidelines for adults include the following key practices.  . A routine yearly physical is a good way to check with your health care provider about your health and preventive screening. It is a chance to share any concerns and updates on your health and to receive a thorough exam.  . Visit your dentist for a routine exam and preventive care every 6 months. Brush your teeth twice a day and floss once a day. Good oral hygiene prevents tooth decay and gum disease.  . The frequency of eye exams is based on your age, health, family medical history, use  of contact lenses, and other factors. Follow your health care provider's recommendations for frequency of eye exams.  . Eat a healthy diet. Foods like vegetables, fruits, whole grains, low-fat dairy products, and lean protein foods contain the nutrients you need without too many calories. Decrease your intake of foods high in solid fats, added sugars, and salt. Eat the right amount of calories for you. Get information about a proper diet from your health care provider, if necessary.  . Regular physical exercise is one of the  most important things you can do for your health. Most adults should get at least 150 minutes of moderate-intensity exercise (any activity that increases your heart rate and causes you to sweat) each week. In addition, most adults need muscle-strengthening exercises on 2 or more days a week.  Silver Sneakers may be a benefit available to you. To determine eligibility, you may visit the website: www.silversneakers.com or contact program at 931-341-4585 Mon-Fri between 8AM-8PM.   . Maintain a healthy weight. The body mass index (BMI) is a screening tool to identify possible weight problems. It provides an estimate of body fat based on height and weight. Your health care provider can find your BMI and can help you achieve or maintain a healthy weight.   For adults 20 years and older: ? A BMI below 18.5 is considered underweight. ? A BMI of 18.5 to 24.9 is normal. ? A BMI of 25 to 29.9 is considered overweight. ? A BMI of 30 and above is considered obese.   . Maintain normal blood lipids and cholesterol levels by exercising and minimizing your intake of saturated fat. Eat a balanced diet with plenty of fruit and vegetables. Blood tests for lipids and cholesterol should begin at age 91 and be repeated every 5 years. If your lipid or cholesterol levels are high, you are over 50, or you are at high risk for heart  disease, you may need your cholesterol levels checked more frequently. Ongoing high lipid and cholesterol levels should be treated with medicines if diet and exercise are not working.  . If you smoke, find out from your health care provider how to quit. If you do not use tobacco, please do not start.  . If you choose to drink alcohol, please do not consume more than 2 drinks per day. One drink is considered to be 12 ounces (355 mL) of beer, 5 ounces (148 mL) of wine, or 1.5 ounces (44 mL) of liquor.  . If you are 76-29 years old, ask your health care provider if you should take aspirin to  prevent strokes.  . Use sunscreen. Apply sunscreen liberally and repeatedly throughout the day. You should seek shade when your shadow is shorter than you. Protect yourself by wearing long sleeves, pants, a wide-brimmed hat, and sunglasses year round, whenever you are outdoors.  . Once a month, do a whole body skin exam, using a mirror to look at the skin on your back. Tell your health care provider of new moles, moles that have irregular borders, moles that are larger than a pencil eraser, or moles that have changed in shape or color.

## 2018-12-17 ENCOUNTER — Other Ambulatory Visit: Payer: Self-pay | Admitting: Family Medicine

## 2018-12-28 ENCOUNTER — Other Ambulatory Visit: Payer: Self-pay

## 2018-12-28 ENCOUNTER — Emergency Department (HOSPITAL_COMMUNITY)
Admission: EM | Admit: 2018-12-28 | Discharge: 2018-12-28 | Disposition: A | Discharge status: Home / Self Care | Payer: Medicare HMO | Attending: Emergency Medicine | Admitting: Emergency Medicine

## 2018-12-28 DIAGNOSIS — I5030 Unspecified diastolic (congestive) heart failure: Secondary | ICD-10-CM | POA: Diagnosis not present

## 2018-12-28 DIAGNOSIS — Z87891 Personal history of nicotine dependence: Secondary | ICD-10-CM | POA: Insufficient documentation

## 2018-12-28 DIAGNOSIS — E119 Type 2 diabetes mellitus without complications: Secondary | ICD-10-CM | POA: Insufficient documentation

## 2018-12-28 DIAGNOSIS — Z79899 Other long term (current) drug therapy: Secondary | ICD-10-CM | POA: Insufficient documentation

## 2018-12-28 DIAGNOSIS — R040 Epistaxis: Secondary | ICD-10-CM | POA: Diagnosis not present

## 2018-12-28 DIAGNOSIS — I110 Hypertensive heart disease with heart failure: Secondary | ICD-10-CM | POA: Insufficient documentation

## 2018-12-28 LAB — CBC
HCT: 34 % — ABNORMAL LOW (ref 39.0–52.0)
Hemoglobin: 10.6 g/dL — ABNORMAL LOW (ref 13.0–17.0)
MCH: 30.2 pg (ref 26.0–34.0)
MCHC: 31.2 g/dL (ref 30.0–36.0)
MCV: 96.9 fL (ref 80.0–100.0)
Platelets: 366 10*3/uL (ref 150–400)
RBC: 3.51 MIL/uL — ABNORMAL LOW (ref 4.22–5.81)
RDW: 14.6 % (ref 11.5–15.5)
WBC: 8.6 10*3/uL (ref 4.0–10.5)
nRBC: 0 % (ref 0.0–0.2)

## 2018-12-28 MED ORDER — OXYMETAZOLINE HCL 0.05 % NA SOLN
1.0000 | Freq: Once | NASAL | Status: AC
  Administered 2018-12-28: 1 via NASAL
  Filled 2018-12-28: qty 30

## 2018-12-28 MED ORDER — OXYMETAZOLINE HCL 0.05 % NA SOLN
2.0000 | Freq: Once | NASAL | Status: AC
  Administered 2018-12-28: 2 via NASAL
  Filled 2018-12-28: qty 30

## 2018-12-28 NOTE — ED Provider Notes (Signed)
[facility] EMERGENCY DEPARTMENT Provider Note   CSN: [id number] Arrival date & time: 12/28/18  1541    History   Chief Complaint Chief Complaint  Patient presents with  . Epistaxis    HPI [name] is a 85 y.o. male.      Epistaxis  Location:  L nare Severity:  Mild Duration:  1 day Timing:  Rare Progression:  Resolved Chronicity:  New Context: not foreign body, not recent infection and not trauma   Relieved by:  Applying pressure Worsened by:  Nothing Ineffective treatments:  None tried Associated symptoms: no blood in oropharynx, no congestion, no cough, no dizziness, no facial pain, no fever, no headaches, no sinus pain, no sore throat and no syncope   Risk factors: no alcohol use and no allergies     Past Medical History:  Diagnosis Date  . Arthritis   . CAD (coronary artery disease)   . CAD (coronary artery disease)    post diaphragjatic wall infarction and subsequent coronary artery bypass graft surgery in 2003, now stable  . Diabetes mellitus   . Diastolic CHF ([place]) 04/06/4314   02/2013 Echo Grade I diastoilc dysfunction. Off lasix for several months 40mg - 04/14/16. Continue to monitor  . Elevated PSA   . GERD (gastroesophageal reflux disease)   . Hemorrhoids   . Hx of adenomatous colonic polyps   . Hyperlipidemia   . Hypertension   . Prostate cancer [facility])     Patient Active Problem List   Diagnosis Date Noted  . Insomnia 07/06/2017  . Pain in right ankle and joints of right foot 08/15/2016  . Diastolic dysfunction 40/05/6760  . Constipation 06/16/2010  . GERD 08/04/2009  . Prostate cancer ([place]) 08/21/2008  . Umbilical hernia 95/06/3266  . Hiatal hernia 02/13/2008  . DEGENERATIVE DISC DISEASE, CERVICAL SPINE 02/13/2008  . Diabetes mellitus type II, controlled ([place]) 06/04/2007  . Hyperlipidemia associated with type 2 diabetes mellitus ([place]) 06/04/2007  . Hypertension associated with diabetes ([place]) 06/04/2007  . CAD  (coronary artery disease) 06/04/2007    Past Surgical History:  Procedure Laterality Date  . CORONARY ARTERY BYPASS GRAFT          Home Medications    Prior to Admission medications   Medication Sig Start Date End Date Taking? Authorizing Provider  ACCU-CHEK AVIVA PLUS test strip TEST BLOOD SUGAR TWICE DAILY 10/10/18   [name], MD  ACCU-CHEK SOFTCLIX LANCETS lancets USE  TO  TEST TWICE DAILY 07/17/18   [name], MD  Alcohol Swabs (B-D SINGLE USE SWABS REGULAR) PADS  08/03/17   [provider]  aspirin (ECOTRIN) 325 MG EC tablet Take 325 mg by mouth daily.      [provider]  benazepril (LOTENSIN) 40 MG tablet TAKE 1 TABLET EVERY DAY 04/09/18   [name], MD  hydrochlorothiazide (HYDRODIURIL) 25 MG tablet Take 1 tablet (25 mg total) by mouth daily. 08/13/18   [name], MD  insulin NPH-regular Human (NOVOLIN 70/30) (70-30) 100 UNIT/ML injection Inject 20 units into the skin each morning and 18 units into the skin each evening. 04/09/18   [name], MD  Insulin Pen Needle (BD PEN NEEDLE NANO U/F) 32G X 4 MM MISC USE TWICE DAILY. DX: E11.9 01/05/17   [name], MD  Insulin Syringe-Needle U-100 25G X 5/8" 1 ML MISC Use twice a day. May substitute as needed for relion insulin 70/30. 12/28/17   [name], MD  lovastatin (MEVACOR) 40 MG tablet Take 1 tablet (40 mg total) by mouth at bedtime. 01/02/17   [name], MD  metFORMIN (GLUCOPHAGE) 500 MG tablet TAKE 1 TABLET TWICE DAILY WITH A MEAL 12/17/18   [name], MD  traZODone (DESYREL) 50 MG tablet Take 0.5-1 tablets (25-50 mg total) by mouth at bedtime as needed for sleep. 08/13/18   [name], MD  vitamin B-12 (CYANOCOBALAMIN) 100 MCG tablet Take 50 mcg by mouth daily.      [provider]  vitamin C (ASCORBIC ACID) 500 MG tablet Take 500 mg by mouth daily.      [provider]    Family History Family History  Problem Relation  Age of Onset  . Heart disease Mother   . Diabetes Mother     Social History Social History   Tobacco Use  . Smoking status: Former Smoker    Last attempt to quit: 10/25/1948    Years since quitting: 70.2  . Smokeless tobacco: Never Used  Substance Use Topics  . Alcohol use: No  . Drug use: No     Allergies   Patient has no known allergies.   Review of Systems Review of Systems  Constitutional: Negative for chills and fever.  HENT: Positive for nosebleeds. Negative for congestion, ear pain, sinus pain and sore throat.   Eyes: Negative for pain and visual disturbance.  Respiratory: Negative for cough and shortness of breath.   Cardiovascular: Negative for chest pain, palpitations and syncope.  Gastrointestinal: Negative for abdominal pain and vomiting.  Genitourinary: Negative for dysuria and hematuria.  Musculoskeletal: Negative for arthralgias and back pain.  Skin: Negative for color change and rash.  Neurological: Negative for dizziness, seizures, syncope and headaches.  All other systems reviewed and are negative.    Physical Exam Updated Vital Signs BP (!) 175/83 (BP Location: Right Arm)   Pulse 77   Temp 97.8 F (36.6 C) (Oral)   Resp 16   SpO2 99%   Physical Exam Vitals signs and nursing note reviewed.  Constitutional:      Appearance: He is well-developed.     Comments: Patient resting comfortably, no acute distress.  HENT:     Head: Normocephalic and atraumatic.     Comments: Mild crusting of blood in the left nare.  No septal hematoma, no active bleeding.  No blood in the posterior oropharynx. Eyes:     Conjunctiva/sclera: Conjunctivae normal.  Neck:     Musculoskeletal: Neck supple.  Cardiovascular:     Rate and Rhythm: Normal rate and regular rhythm.     Heart sounds: No murmur.  Pulmonary:     Effort: Pulmonary effort is normal. No respiratory distress.     Breath sounds: Normal breath sounds.  Abdominal:     Palpations: Abdomen is soft.      Tenderness: There is no abdominal tenderness.  Skin:    General: Skin is warm and dry.     Capillary Refill: Capillary refill takes less than 2 seconds.  Neurological:     General: No focal deficit present.     Mental Status: He is alert.  Psychiatric:        Mood and Affect: Mood normal.      ED Treatments / Results  Labs (all labs ordered are listed, but only abnormal results are displayed) Labs Reviewed  CBC - Abnormal; Notable for the following components:      Result Value   RBC 3.51 (*)  Hemoglobin 10.6 (*)    HCT 34.0 (*)    All other components within normal limits    EKG None  Radiology No results found.  Procedures Procedures (including critical care time)  Medications Ordered in ED Medications  oxymetazoline (AFRIN) 0.05 % nasal spray 1 spray (1 spray Each Nare Given by Other 12/28/18 1851)  oxymetazoline (AFRIN) 0.05 % nasal spray 2 spray (2 sprays Each Nare Given 12/28/18 2055)     Initial Impression / Assessment and Plan / ED Course  I have reviewed the triage vital signs and the nursing notes.  Pertinent labs & imaging results that were available during my care of the patient were reviewed by me and considered in my medical decision making (see chart for details).        85 year old male significant past medical history listed above who presents with single episode of bleeding from the left nare which is now resolved after approximately 1 hour.  Patient denies any other symptoms, resting comfortably.  Hemoglobin stable.  Physical exam negative for any acute findings.  Will give Afrin here in the emergency department.  Patient given educational material on epistaxis as well as what to do if it returns.  Patient is hypertensive here in emergency department.  Patient denied any chest pain, headache, vision change.  Discussed with patient regarding checking creatinine to see if the hypertension has affected his kidney function due to the fact that the  patient usually has blood pressures around 992 systolic.  Patient indicated that he would rather not and would like to leave and follow-up with his primary care provider.  Patient did take his hypertensive medication today.  Based on patient feeling at baseline, stable hemoglobin, patient following up with his primary care regarding blood pressure management.  Patient discharged in stable additional stable vital signs.  The above care was discussed and agreed upon by my attending physician.  Final Clinical Impressions(s) / ED Diagnoses   Final diagnoses:  Epistaxis    ED Discharge Orders    None       Orson Aloe, MD 12/29/18 4268    Elnora Morrison, MD 12/30/18 [phone]

## 2018-12-28 NOTE — ED Triage Notes (Signed)
Patient reports epistaxis x 2 hours - both nostrils. Not on blood thinner.

## 2018-12-28 NOTE — ED Notes (Signed)
ED Provider at bedside. 

## 2018-12-28 NOTE — ED Notes (Signed)
Patient verbalizes understanding of discharge instructions. Opportunity for questioning and answers were provided. Patient discharged in w/c from ED via POV

## 2018-12-31 ENCOUNTER — Observation Stay (HOSPITAL_COMMUNITY)
Admission: EM | Admit: 2018-12-31 | Discharge: 2019-01-02 | Disposition: A | Discharge status: Home / Self Care | Payer: Medicare HMO | Attending: Internal Medicine | Admitting: Internal Medicine

## 2018-12-31 ENCOUNTER — Encounter (HOSPITAL_COMMUNITY): Payer: Self-pay | Admitting: Emergency Medicine

## 2018-12-31 ENCOUNTER — Other Ambulatory Visit: Payer: Self-pay

## 2018-12-31 ENCOUNTER — Ambulatory Visit (INDEPENDENT_AMBULATORY_CARE_PROVIDER_SITE_OTHER): Payer: Medicare HMO | Admitting: Physician Assistant

## 2018-12-31 ENCOUNTER — Encounter: Payer: Self-pay | Admitting: Physician Assistant

## 2018-12-31 VITALS — BP 140/60 | HR 98 | Temp 98.5°F | Ht 68.0 in | Wt 221.5 lb

## 2018-12-31 DIAGNOSIS — R531 Weakness: Secondary | ICD-10-CM

## 2018-12-31 DIAGNOSIS — N179 Acute kidney failure, unspecified: Secondary | ICD-10-CM | POA: Diagnosis not present

## 2018-12-31 DIAGNOSIS — Z87891 Personal history of nicotine dependence: Secondary | ICD-10-CM | POA: Insufficient documentation

## 2018-12-31 DIAGNOSIS — K922 Gastrointestinal hemorrhage, unspecified: Secondary | ICD-10-CM

## 2018-12-31 DIAGNOSIS — M6281 Muscle weakness (generalized): Secondary | ICD-10-CM | POA: Insufficient documentation

## 2018-12-31 DIAGNOSIS — D500 Iron deficiency anemia secondary to blood loss (chronic): Secondary | ICD-10-CM | POA: Insufficient documentation

## 2018-12-31 DIAGNOSIS — Z8249 Family history of ischemic heart disease and other diseases of the circulatory system: Secondary | ICD-10-CM | POA: Diagnosis not present

## 2018-12-31 DIAGNOSIS — N183 Chronic kidney disease, stage 3 (moderate): Secondary | ICD-10-CM | POA: Diagnosis not present

## 2018-12-31 DIAGNOSIS — E785 Hyperlipidemia, unspecified: Secondary | ICD-10-CM | POA: Diagnosis not present

## 2018-12-31 DIAGNOSIS — R040 Epistaxis: Secondary | ICD-10-CM | POA: Diagnosis not present

## 2018-12-31 DIAGNOSIS — R339 Retention of urine, unspecified: Secondary | ICD-10-CM | POA: Diagnosis present

## 2018-12-31 DIAGNOSIS — Z794 Long term (current) use of insulin: Secondary | ICD-10-CM

## 2018-12-31 DIAGNOSIS — Z955 Presence of coronary angioplasty implant and graft: Secondary | ICD-10-CM | POA: Diagnosis not present

## 2018-12-31 DIAGNOSIS — I5032 Chronic diastolic (congestive) heart failure: Secondary | ICD-10-CM | POA: Insufficient documentation

## 2018-12-31 DIAGNOSIS — K2950 Unspecified chronic gastritis without bleeding: Secondary | ICD-10-CM | POA: Insufficient documentation

## 2018-12-31 DIAGNOSIS — R195 Other fecal abnormalities: Secondary | ICD-10-CM

## 2018-12-31 DIAGNOSIS — I130 Hypertensive heart and chronic kidney disease with heart failure and stage 1 through stage 4 chronic kidney disease, or unspecified chronic kidney disease: Secondary | ICD-10-CM | POA: Insufficient documentation

## 2018-12-31 DIAGNOSIS — Z833 Family history of diabetes mellitus: Secondary | ICD-10-CM | POA: Insufficient documentation

## 2018-12-31 DIAGNOSIS — E1159 Type 2 diabetes mellitus with other circulatory complications: Secondary | ICD-10-CM

## 2018-12-31 DIAGNOSIS — E1122 Type 2 diabetes mellitus with diabetic chronic kidney disease: Secondary | ICD-10-CM | POA: Insufficient documentation

## 2018-12-31 DIAGNOSIS — K219 Gastro-esophageal reflux disease without esophagitis: Secondary | ICD-10-CM | POA: Insufficient documentation

## 2018-12-31 DIAGNOSIS — R2681 Unsteadiness on feet: Secondary | ICD-10-CM | POA: Insufficient documentation

## 2018-12-31 DIAGNOSIS — E119 Type 2 diabetes mellitus without complications: Secondary | ICD-10-CM

## 2018-12-31 DIAGNOSIS — R5383 Other fatigue: Secondary | ICD-10-CM | POA: Diagnosis not present

## 2018-12-31 DIAGNOSIS — Z951 Presence of aortocoronary bypass graft: Secondary | ICD-10-CM | POA: Diagnosis not present

## 2018-12-31 DIAGNOSIS — H00036 Abscess of eyelid left eye, unspecified eyelid: Secondary | ICD-10-CM | POA: Insufficient documentation

## 2018-12-31 DIAGNOSIS — I2510 Atherosclerotic heart disease of native coronary artery without angina pectoris: Secondary | ICD-10-CM | POA: Insufficient documentation

## 2018-12-31 DIAGNOSIS — R338 Other retention of urine: Secondary | ICD-10-CM | POA: Insufficient documentation

## 2018-12-31 DIAGNOSIS — Z8546 Personal history of malignant neoplasm of prostate: Secondary | ICD-10-CM | POA: Diagnosis not present

## 2018-12-31 DIAGNOSIS — Z79899 Other long term (current) drug therapy: Secondary | ICD-10-CM | POA: Diagnosis not present

## 2018-12-31 DIAGNOSIS — K920 Hematemesis: Secondary | ICD-10-CM | POA: Insufficient documentation

## 2018-12-31 DIAGNOSIS — D649 Anemia, unspecified: Secondary | ICD-10-CM | POA: Diagnosis not present

## 2018-12-31 DIAGNOSIS — Z7982 Long term (current) use of aspirin: Secondary | ICD-10-CM | POA: Diagnosis not present

## 2018-12-31 HISTORY — DX: Retention of urine, unspecified: R33.9

## 2018-12-31 LAB — COMPREHENSIVE METABOLIC PANEL
ALT: 12 U/L (ref 0–44)
AST: 20 U/L (ref 15–41)
Albumin: 3.5 g/dL (ref 3.5–5.0)
Alkaline Phosphatase: 39 U/L (ref 38–126)
Anion gap: 8 (ref 5–15)
BUN: 55 mg/dL — ABNORMAL HIGH (ref 8–23)
CO2: 21 mmol/L — ABNORMAL LOW (ref 22–32)
Calcium: 9.1 mg/dL (ref 8.9–10.3)
Chloride: 106 mmol/L (ref 98–111)
Creatinine, Ser: 1.56 mg/dL — ABNORMAL HIGH (ref 0.61–1.24)
GFR calc Af Amer: 43 mL/min — ABNORMAL LOW (ref >60)
GFR calc non Af Amer: 37 mL/min — ABNORMAL LOW (ref >60)
Glucose, Bld: 188 mg/dL — ABNORMAL HIGH (ref 70–99)
Potassium: 4.5 mmol/L (ref 3.5–5.1)
Sodium: 135 mmol/L (ref 135–145)
Total Bilirubin: 0.4 mg/dL (ref 0.3–1.2)
Total Protein: 6.2 g/dL — ABNORMAL LOW (ref 6.5–8.1)

## 2018-12-31 LAB — PROTIME-INR
INR: 1.1 (ref 0.8–1.2)
Prothrombin Time: 14.5 seconds (ref 11.4–15.2)

## 2018-12-31 LAB — GLUCOSE, POCT (MANUAL RESULT ENTRY)
POC Glucose: 271 mg/dl — AB (ref 70–99)
POC Glucose: 285 mg/dl — AB (ref 70–99)

## 2018-12-31 LAB — ABO/RH: ABO/RH(D): O POS

## 2018-12-31 LAB — CBC
HCT: 26.1 % — ABNORMAL LOW (ref 39.0–52.0)
Hemoglobin: 8.1 g/dL — ABNORMAL LOW (ref 13.0–17.0)
MCH: 29.6 pg (ref 26.0–34.0)
MCHC: 31 g/dL (ref 30.0–36.0)
MCV: 95.3 fL (ref 80.0–100.0)
Platelets: 362 10*3/uL (ref 150–400)
RBC: 2.74 MIL/uL — ABNORMAL LOW (ref 4.22–5.81)
RDW: 14.1 % (ref 11.5–15.5)
WBC: 11.8 10*3/uL — ABNORMAL HIGH (ref 4.0–10.5)
nRBC: 0 % (ref 0.0–0.2)

## 2018-12-31 LAB — POC OCCULT BLOOD, ED: Fecal Occult Bld: POSITIVE — AB

## 2018-12-31 LAB — APTT: aPTT: 29 seconds (ref 24–36)

## 2018-12-31 LAB — CBG MONITORING, ED: Glucose-Capillary: 155 mg/dL — ABNORMAL HIGH (ref 70–99)

## 2018-12-31 MED ORDER — SODIUM CHLORIDE 0.9 % IV SOLN
80.0000 mg | Freq: Once | INTRAVENOUS | Status: AC
  Administered 2019-01-01: 80 mg via INTRAVENOUS
  Filled 2018-12-31: qty 80

## 2018-12-31 MED ORDER — PANTOPRAZOLE SODIUM 40 MG IV SOLR: 40.0000 mg | Freq: Once | INTRAVENOUS | Status: DC

## 2018-12-31 MED ORDER — PANTOPRAZOLE SODIUM 40 MG IV SOLR: 40.0000 mg | Freq: Two times a day (BID) | INTRAVENOUS | Status: DC

## 2018-12-31 MED ORDER — SODIUM CHLORIDE 0.9 % IV SOLN
8.0000 mg/h | INTRAVENOUS | Status: DC
  Administered 2019-01-01: 8 mg/h via INTRAVENOUS
  Filled 2018-12-31: qty 80

## 2018-12-31 MED ORDER — SODIUM CHLORIDE 0.9 % IV SOLN
Freq: Once | INTRAVENOUS | Status: AC
  Administered 2018-12-31: 23:00:00 via INTRAVENOUS

## 2018-12-31 NOTE — ED Triage Notes (Signed)
Pt reports episode of vomiting blood x 1 today. Denies chest pain/shortness of breath/dizziness.

## 2018-12-31 NOTE — ED Notes (Signed)
Given  Diet  [place]

## 2018-12-31 NOTE — Patient Instructions (Signed)
GO TO THE ER NOW.  IF YOU FEEL DIZZY OR WEAK AT ANYTIME ON THE DRIVE, PULL OVER AND CALL 911.

## 2018-12-31 NOTE — Progress Notes (Signed)
[name] is a 85 y.o. male here for a new problem.  History of Present Illness:   Chief Complaint  Patient presents with  . Diabetes    HPI   Patient walked into the office today to be seen for elevated blood sugar.  He states that when he woke up this morning his blood sugar was 345.  He states that he then gave himself 20 units of insulin and rechecked his blood sugar and it was still 345.  He then decided to eat and donut and a banana.  He states yesterday his blood sugars were normal, and remembers his blood sugar being 120.  When I asked him if he felt sick this morning he replied that he had vomiting with blood in it and felt very weak when he woke up this morning.  He drove himself here to the office today.   Past Medical History:  Diagnosis Date  . Arthritis   . CAD (coronary artery disease)   . CAD (coronary artery disease)    post diaphragjatic wall infarction and subsequent coronary artery bypass graft surgery in 2003, now stable  . Diabetes mellitus   . Diastolic CHF ([place]) 2/37/6283   02/2013 Echo Grade I diastoilc dysfunction. Off lasix for several months 40mg - 04/14/16. Continue to monitor  . Elevated PSA   . GERD (gastroesophageal reflux disease)   . Hemorrhoids   . Hx of adenomatous colonic polyps   . Hyperlipidemia   . Hypertension   . Prostate cancer [facility]      Social History   Socioeconomic History  . Marital status: Divorced    Spouse name: Not on file  . Number of children: Not on file  . Years of education: Not on file  . Highest education level: Not on file  Occupational History  . Occupation: Retired    Fish farm manager: [employer]  Social Needs  . Financial resource strain: Not on file  . Food insecurity:    Worry: Not on file    Inability: Not on file  . Transportation needs:    Medical: Not on file    Non-medical: Not on file  Tobacco Use  . Smoking status: Former Smoker    Last attempt to quit: 10/25/1948    Years since  quitting: 70.2  . Smokeless tobacco: Never Used  Substance and Sexual Activity  . Alcohol use: No  . Drug use: No  . Sexual activity: Not Currently  Lifestyle  . Physical activity:    Days per week: Not on file    Minutes per session: Not on file  . Stress: Not on file  Relationships  . Social connections:    Talks on phone: Not on file    Gets together: Not on file    Attends religious service: Not on file    Active member of club or organization: Not on file    Attends meetings of clubs or organizations: Not on file    Relationship status: Not on file  . Intimate partner violence:    Fear of current or ex partner: Not on file    Emotionally abused: Not on file    Physically abused: Not on file    Forced sexual activity: Not on file  Other Topics Concern  . Not on file  Social History Narrative   Divorced. 4 children. 7 grandkids. 2 greatgrandkids.       Retired from working at [employer] in [employer]  Hobbies: go to gym 3 days a week, tv and sports    Past Surgical History:  Procedure Laterality Date  . CORONARY ARTERY BYPASS GRAFT      Family History  Problem Relation Age of Onset  . Heart disease Mother   . Diabetes Mother     No Known Allergies  Current Medications:   Current Outpatient Medications:  .  ACCU-CHEK AVIVA PLUS test strip, TEST BLOOD SUGAR TWICE DAILY, Disp: 200 each, Rfl: 3 .  ACCU-CHEK SOFTCLIX LANCETS lancets, USE  TO  TEST TWICE DAILY, Disp: 200 each, Rfl: 3 .  Alcohol Swabs (B-D SINGLE USE SWABS REGULAR) PADS, , Disp: , Rfl:  .  aspirin (ECOTRIN) 325 MG EC tablet, Take 325 mg by mouth daily.  , Disp: , Rfl:  .  benazepril (LOTENSIN) 40 MG tablet, TAKE 1 TABLET EVERY DAY, Disp: 90 tablet, Rfl: 3 .  hydrochlorothiazide (HYDRODIURIL) 25 MG tablet, Take 1 tablet (25 mg total) by mouth daily., Disp: 90 tablet, Rfl: 3 .  insulin NPH-regular Human (NOVOLIN 70/30) (70-30) 100 UNIT/ML injection, Inject 20 units into the skin each morning  and 18 units into the skin each evening., Disp: 10 mL, Rfl: 5 .  Insulin Pen Needle (BD PEN NEEDLE NANO U/F) 32G X 4 MM MISC, USE TWICE DAILY. DX: E11.9, Disp: 90 each, Rfl: 11 .  Insulin Syringe-Needle U-100 25G X 5/8" 1 ML MISC, Use twice a day. May substitute as needed for relion insulin 70/30., Disp: 100 each, Rfl: 5 .  lovastatin (MEVACOR) 40 MG tablet, Take 1 tablet (40 mg total) by mouth at bedtime., Disp: 90 tablet, Rfl: 3 .  metFORMIN (GLUCOPHAGE) 500 MG tablet, TAKE 1 TABLET TWICE DAILY WITH A MEAL, Disp: 180 tablet, Rfl: 3 .  traZODone (DESYREL) 50 MG tablet, Take 0.5-1 tablets (25-50 mg total) by mouth at bedtime as needed for sleep., Disp: 30 tablet, Rfl: 5 .  vitamin B-12 (CYANOCOBALAMIN) 100 MCG tablet, Take 50 mcg by mouth daily.  , Disp: , Rfl:  .  vitamin C (ASCORBIC ACID) 500 MG tablet, Take 500 mg by mouth daily.  , Disp: , Rfl:    Review of Systems:   Review of Systems  Constitutional: Negative for chills, fever, malaise/fatigue and weight loss.  HENT: Positive for hearing loss.   Respiratory: Negative for shortness of breath.   Cardiovascular: Negative for chest pain, orthopnea, claudication and leg swelling.  Gastrointestinal: Positive for vomiting. Negative for abdominal pain, heartburn and nausea.  Neurological: Negative for dizziness, tingling and headaches.  Psychiatric/Behavioral: Negative for depression.    Vitals:   Vitals:   12/31/18 1345  BP: 140/60  Pulse: 98  Temp: 98.5 F (36.9 C)  TempSrc: Oral  SpO2: 93%  Weight: 221 lb 8 oz (100.5 kg)  Height: 5\' 8"  (1.727 m)     Body mass index is 33.68 kg/m.  Physical Exam:   Physical Exam Vitals signs and nursing note reviewed.  Constitutional:      Appearance: He is well-developed.  HENT:     Head: Normocephalic.  Eyes:     Conjunctiva/sclera: Conjunctivae normal.     Pupils: Pupils are equal, round, and reactive to light.  Neck:     Musculoskeletal: Normal range of motion.  Pulmonary:      Effort: Pulmonary effort is normal.  Musculoskeletal: Normal range of motion.  Skin:    General: Skin is warm and dry.  Neurological:     Mental Status: He is alert and oriented  to person, place, and time.  Psychiatric:        Behavior: Behavior normal.        Thought Content: Thought content normal.        Judgment: Judgment normal.     Results for orders placed or performed in visit on 12/31/18  POCT glucose (manual entry)  Result Value Ref Range   POC Glucose 271 (A) 70 - 99 mg/dl  POCT glucose (manual entry)  Result Value Ref Range   POC Glucose 285 (A) 70 - 99 mg/dl    Assessment and Plan:   Kenson was seen today for diabetes.  Diagnoses and all orders for this visit:  Controlled type 2 diabetes mellitus with other circulatory complication, with long-term current use of insulin (HCC) -     POCT glucose (manual entry) -     POCT glucose (manual entry)  Weakness  Hematemesis with nausea   Patient blood sugar was 271 at beginning of visit, and by the end of visit was 285.  Patient adamantly declined EMS transport to ED.  I discussed risks of not taking EMS to the hospital, however patient refused.  We also offered to call his family to have him come in to help him get to the ER, however he refused.  Patient reported that he can go to the emergency room.  I told patient that if he develops any dizziness, lightheadedness or any other symptoms to immediately pull over and call 911.  Patient verbalized understanding.  . Reviewed expectations re: course of current medical issues. . Discussed self-management of symptoms. . Outlined signs and symptoms indicating need for more acute intervention. . Patient verbalized understanding and all questions were answered. . See orders for this visit as documented in the electronic medical record. . Patient received an After-Visit Summary.  Patient has a HIGH level of medical complexity due to number of diagnoses/treatment options  and amount/complexity of data reviewed.    Inda Coke, PA-C

## 2019-01-01 ENCOUNTER — Other Ambulatory Visit: Payer: Self-pay

## 2019-01-01 ENCOUNTER — Observation Stay (HOSPITAL_COMMUNITY): Payer: Medicare HMO

## 2019-01-01 ENCOUNTER — Encounter (HOSPITAL_COMMUNITY): Payer: Self-pay | Admitting: Family Medicine

## 2019-01-01 DIAGNOSIS — R339 Retention of urine, unspecified: Secondary | ICD-10-CM

## 2019-01-01 DIAGNOSIS — K922 Gastrointestinal hemorrhage, unspecified: Secondary | ICD-10-CM | POA: Diagnosis not present

## 2019-01-01 DIAGNOSIS — K920 Hematemesis: Secondary | ICD-10-CM | POA: Diagnosis present

## 2019-01-01 DIAGNOSIS — R195 Other fecal abnormalities: Secondary | ICD-10-CM | POA: Diagnosis not present

## 2019-01-01 DIAGNOSIS — N179 Acute kidney failure, unspecified: Secondary | ICD-10-CM | POA: Diagnosis not present

## 2019-01-01 DIAGNOSIS — H00036 Abscess of eyelid left eye, unspecified eyelid: Secondary | ICD-10-CM | POA: Diagnosis present

## 2019-01-01 DIAGNOSIS — D649 Anemia, unspecified: Secondary | ICD-10-CM | POA: Diagnosis not present

## 2019-01-01 DIAGNOSIS — E1159 Type 2 diabetes mellitus with other circulatory complications: Secondary | ICD-10-CM | POA: Diagnosis not present

## 2019-01-01 DIAGNOSIS — R040 Epistaxis: Secondary | ICD-10-CM | POA: Diagnosis not present

## 2019-01-01 DIAGNOSIS — I2510 Atherosclerotic heart disease of native coronary artery without angina pectoris: Secondary | ICD-10-CM | POA: Diagnosis not present

## 2019-01-01 LAB — GLUCOSE, CAPILLARY
Glucose-Capillary: 145 mg/dL — ABNORMAL HIGH (ref 70–99)
Glucose-Capillary: 122 mg/dL — ABNORMAL HIGH (ref 70–99)
Glucose-Capillary: 132 mg/dL — ABNORMAL HIGH (ref 70–99)
Glucose-Capillary: 151 mg/dL — ABNORMAL HIGH (ref 70–99)
Glucose-Capillary: 196 mg/dL — ABNORMAL HIGH (ref 70–99)
Glucose-Capillary: 205 mg/dL — ABNORMAL HIGH (ref 70–99)

## 2019-01-01 LAB — BASIC METABOLIC PANEL
Anion gap: 7 (ref 5–15)
BUN: 54 mg/dL — ABNORMAL HIGH (ref 8–23)
CO2: 22 mmol/L (ref 22–32)
Calcium: 8.7 mg/dL — ABNORMAL LOW (ref 8.9–10.3)
Chloride: 106 mmol/L (ref 98–111)
Creatinine, Ser: 1.65 mg/dL — ABNORMAL HIGH (ref 0.61–1.24)
GFR calc Af Amer: 40 mL/min — ABNORMAL LOW (ref >60)
GFR calc non Af Amer: 35 mL/min — ABNORMAL LOW (ref >60)
Glucose, Bld: 160 mg/dL — ABNORMAL HIGH (ref 70–99)
Potassium: 3.9 mmol/L (ref 3.5–5.1)
Sodium: 135 mmol/L (ref 135–145)

## 2019-01-01 LAB — HEMOGLOBIN AND HEMATOCRIT, BLOOD
HCT: 26.1 % — ABNORMAL LOW (ref 39.0–52.0)
Hemoglobin: 8.5 g/dL — ABNORMAL LOW (ref 13.0–17.0)

## 2019-01-01 LAB — HEMATOCRIT: HCT: 22.8 % — ABNORMAL LOW (ref 39.0–52.0)

## 2019-01-01 LAB — PREPARE RBC (CROSSMATCH)

## 2019-01-01 LAB — HEMOGLOBIN: Hemoglobin: 7.4 g/dL — ABNORMAL LOW (ref 13.0–17.0)

## 2019-01-01 MED ORDER — TAMSULOSIN HCL 0.4 MG PO CAPS
0.4000 mg | ORAL_CAPSULE | Freq: Every day | ORAL | Status: DC
  Administered 2019-01-01 – 2019-01-02 (×2): 0.4 mg via ORAL
  Filled 2019-01-01 (×2): qty 1

## 2019-01-01 MED ORDER — AMOXICILLIN-POT CLAVULANATE 875-125 MG PO TABS
1.0000 | ORAL_TABLET | Freq: Two times a day (BID) | ORAL | Status: DC
  Administered 2019-01-01 – 2019-01-02 (×4): 1 via ORAL
  Filled 2019-01-01 (×4): qty 1

## 2019-01-01 MED ORDER — PANTOPRAZOLE SODIUM 40 MG IV SOLR
40.0000 mg | Freq: Two times a day (BID) | INTRAVENOUS | Status: DC
  Administered 2019-01-01: 40 mg via INTRAVENOUS
  Filled 2019-01-01: qty 40

## 2019-01-01 MED ORDER — ACETAMINOPHEN 650 MG RE SUPP: 650.0000 mg | Freq: Four times a day (QID) | RECTAL | Status: DC | PRN

## 2019-01-01 MED ORDER — SODIUM CHLORIDE 0.9% IV SOLUTION
Freq: Once | INTRAVENOUS | Status: AC
  Administered 2019-01-01: 12:00:00 via INTRAVENOUS

## 2019-01-01 MED ORDER — INSULIN DETEMIR 100 UNIT/ML ~~LOC~~ SOLN
10.0000 [IU] | Freq: Two times a day (BID) | SUBCUTANEOUS | Status: DC
  Administered 2019-01-01 – 2019-01-02 (×4): 10 [IU] via SUBCUTANEOUS
  Filled 2019-01-01 (×4): qty 0.1

## 2019-01-01 MED ORDER — ONDANSETRON HCL 4 MG/2ML IJ SOLN: 4.0000 mg | Freq: Four times a day (QID) | INTRAMUSCULAR | Status: DC | PRN

## 2019-01-01 MED ORDER — ONDANSETRON HCL 4 MG PO TABS: 4.0000 mg | ORAL_TABLET | Freq: Four times a day (QID) | ORAL | Status: DC | PRN

## 2019-01-01 MED ORDER — SODIUM CHLORIDE 0.9% FLUSH
3.0000 mL | Freq: Two times a day (BID) | INTRAVENOUS | Status: DC
  Administered 2019-01-01 – 2019-01-02 (×3): 3 mL via INTRAVENOUS

## 2019-01-01 MED ORDER — PRAVASTATIN SODIUM 40 MG PO TABS
40.0000 mg | ORAL_TABLET | Freq: Every day | ORAL | Status: DC
  Administered 2019-01-01: 40 mg via ORAL
  Filled 2019-01-01 (×2): qty 1

## 2019-01-01 MED ORDER — SODIUM CHLORIDE 0.9 % IV SOLN
INTRAVENOUS | Status: AC
  Administered 2019-01-01: 02:00:00 via INTRAVENOUS

## 2019-01-01 MED ORDER — INSULIN ASPART 100 UNIT/ML ~~LOC~~ SOLN
0.0000 [IU] | SUBCUTANEOUS | Status: DC
  Administered 2019-01-01 (×2): 1 [IU] via SUBCUTANEOUS
  Administered 2019-01-01 (×2): 2 [IU] via SUBCUTANEOUS
  Administered 2019-01-01: 3 [IU] via SUBCUTANEOUS
  Administered 2019-01-01: 1 [IU] via SUBCUTANEOUS
  Administered 2019-01-02: 3 [IU] via SUBCUTANEOUS
  Administered 2019-01-02 (×2): 1 [IU] via SUBCUTANEOUS

## 2019-01-01 MED ORDER — OLOPATADINE HCL 0.1 % OP SOLN
1.0000 [drp] | Freq: Two times a day (BID) | OPHTHALMIC | Status: DC
  Administered 2019-01-01 – 2019-01-02 (×4): 1 [drp] via OPHTHALMIC
  Filled 2019-01-01: qty 5

## 2019-01-01 MED ORDER — HYDROCODONE-ACETAMINOPHEN 5-325 MG PO TABS: 1.0000 | ORAL_TABLET | ORAL | Status: DC | PRN

## 2019-01-01 MED ORDER — PANTOPRAZOLE SODIUM 40 MG PO TBEC
40.0000 mg | DELAYED_RELEASE_TABLET | Freq: Every day | ORAL | Status: DC
  Administered 2019-01-02: 40 mg via ORAL
  Filled 2019-01-01: qty 1

## 2019-01-01 MED ORDER — ACETAMINOPHEN 325 MG PO TABS: 650.0000 mg | ORAL_TABLET | Freq: Four times a day (QID) | ORAL | Status: DC | PRN

## 2019-01-01 NOTE — ED Notes (Signed)
ED TO INPATIENT HANDOFF REPORT  ED Nurse Name and Phone #: Suezanne Jacquet [phone]  S Name/Age/Gender [name] 85 y.o. male Room/Bed: 030C/030C  Code Status   Code Status: Not on file  Home/SNF/Other Home Patient oriented to: self, place, time and situation Is this baseline? Yes   Triage Complete: Triage complete  Chief Complaint Sent by dr/nausea/vomitting blood  Triage Note Pt reports episode of vomiting blood x 1 today. Denies chest pain/shortness of breath/dizziness.   Allergies No Known Allergies  Level of Care/Admitting Diagnosis ED Disposition    ED Disposition Condition [facility] Area: [place] [[id number]]  Level of Care: Medical Telemetry [104]  I expect the patient will be discharged within 24 hours: No (not a candidate for 5C-Observation unit)  Diagnosis: Acute upper GI bleed [469629]  Admitting Physician: Vianne Bulls [[id number]]  Attending Physician: Vianne Bulls [[id number]]  PT Class (Do Not Modify): Observation [104]  PT Acc Code (Do Not Modify): Observation [10022]       B Medical/Surgery History Past Medical History:  Diagnosis Date  . Arthritis   . CAD (coronary artery disease)   . CAD (coronary artery disease)    post diaphragjatic wall infarction and subsequent coronary artery bypass graft surgery in 2003, now stable  . Diabetes mellitus   . Diastolic CHF ([place]) 2/53/6644   02/2013 Echo Grade I diastoilc dysfunction. Off lasix for several months 40mg - 04/14/16. Continue to monitor  . Elevated PSA   . GERD (gastroesophageal reflux disease)   . Hemorrhoids   . Hx of adenomatous colonic polyps   . Hyperlipidemia   . Hypertension   . Prostate cancer [facility])    Past Surgical History:  Procedure Laterality Date  . CORONARY ARTERY BYPASS GRAFT       A IV Location/Drains/Wounds Patient Lines/Drains/Airways Status   Active Line/Drains/Airways    Name:   Placement date:   Placement time:   Site:   Days:   Peripheral IV 12/31/18 Right;Upper Forearm   12/31/18    2248    Forearm   1   Urethral Catheter Dr. Ashok Cordia Coude 22 Fr.   03/29/14    0857    Coude   1739          Intake/Output Last 24 hours No intake or output data in the 24 hours ending 01/01/19 0015  Labs/Imaging Results for orders placed or performed during the hospital encounter of 12/31/18 (from the past 48 hour(s))  Comprehensive metabolic panel     Status: Abnormal   Collection Time: 12/31/18  4:28 PM  Result Value Ref Range   Sodium 135 135 - 145 mmol/L   Potassium 4.5 3.5 - 5.1 mmol/L   Chloride 106 98 - 111 mmol/L   CO2 21 (L) 22 - 32 mmol/L   Glucose, Bld 188 (H) 70 - 99 mg/dL   BUN 55 (H) 8 - 23 mg/dL   Creatinine, Ser 1.56 (H) 0.61 - 1.24 mg/dL   Calcium 9.1 8.9 - 10.3 mg/dL   Total Protein 6.2 (L) 6.5 - 8.1 g/dL   Albumin 3.5 3.5 - 5.0 g/dL   AST 20 15 - 41 U/L   ALT 12 0 - 44 U/L   Alkaline Phosphatase 39 38 - 126 U/L   Total Bilirubin 0.4 0.3 - 1.2 mg/dL   GFR calc non Af Amer 37 (L) >60 mL/min   GFR calc Af Amer 43 (L) >60 mL/min   Anion gap 8 5 -  15    Comment: Performed at [facility], [place] [address], [place], [place] [address]  CBC     Status: Abnormal   Collection Time: 12/31/18  4:28 PM  Result Value Ref Range   WBC 11.8 (H) 4.0 - 10.5 K/uL   RBC 2.74 (L) 4.22 - 5.81 MIL/uL   Hemoglobin 8.1 (L) 13.0 - 17.0 g/dL   HCT 26.1 (L) 39.0 - 52.0 %   MCV 95.3 80.0 - 100.0 fL   MCH 29.6 26.0 - 34.0 pg   MCHC 31.0 30.0 - 36.0 g/dL   RDW 14.1 11.5 - 15.5 %   Platelets 362 150 - 400 K/uL   nRBC 0.0 0.0 - 0.2 %    Comment: Performed at [facility], [place] [address], [place], [place] [address]  Type and screen [place]     Status: None   Collection Time: 12/31/18  4:30 PM  Result Value Ref Range   ABO/RH(D) O POS    Antibody Screen NEG    Sample Expiration      01/03/2019 Performed at [facility], [place] [address], [place], [place] [address]   ABO/Rh     Status:  None   Collection Time: 12/31/18  4:30 PM  Result Value Ref Range   ABO/RH(D)      O POS Performed at [facility] [address] [place], [place] [address]   Protime-INR     Status: None   Collection Time: 12/31/18  9:55 PM  Result Value Ref Range   Prothrombin Time 14.5 11.4 - 15.2 seconds   INR 1.1 0.8 - 1.2    Comment: (NOTE) INR goal varies based on device and disease states. Performed at [facility], [place] [address] [place], [place] [address]   APTT     Status: None   Collection Time: 12/31/18  9:55 PM  Result Value Ref Range   aPTT 29 24 - 36 seconds    Comment: Performed at [facility] [address] [place], [place] [address]  POC CBG, ED     Status: Abnormal   Collection Time: 12/31/18 10:41 PM  Result Value Ref Range   Glucose-Capillary 155 (H) 70 - 99 mg/dL  POC occult blood, ED     Status: Abnormal   Collection Time: 12/31/18 11:09 PM  Result Value Ref Range   Fecal Occult Bld POSITIVE (A) NEGATIVE   No results found.  Pending Labs Unresulted Labs (From admission, onward)   None      Vitals/Pain Today's Vitals   12/31/18 1625 12/31/18 1627 12/31/18 2043 12/31/18 2230  BP: 122/61  (!) 144/69 129/63  Pulse: 98  91 81  Resp: 16  18 16   Temp: 98.9 F (37.2 C)     TempSrc: Oral     SpO2: 100%  100% 94%  PainSc:  0-No pain      Isolation Precautions No active isolations  Medications Medications  pantoprazole (PROTONIX) 80 mg in sodium chloride 0.9 % 100 mL IVPB (has no administration in time range)  pantoprazole (PROTONIX) 80 mg in sodium chloride 0.9 % 250 mL (0.32 mg/mL) infusion (has no administration in time range)  pantoprazole (PROTONIX) injection 40 mg (has no administration in time range)  0.9 %  sodium chloride infusion ( Intravenous New Bag/Given 12/31/18 2249)    Mobility walks with person assist Low fall risk   Focused Assessments Gastro Assessment  Fecal Occult Positive  R Recommendations: See  Admitting Provider Note  Report given to:   Additional Notes:  Patient is deaf. Communicated with pt by writing things down and he can verbally answer. Pt drove himself to the hospital. Son at bedside.

## 2019-01-01 NOTE — Consult Note (Addendum)
[facility] Consult: 12:55 PM 01/01/2019  LOS: 0 days    Referring Provider: Dr Fuller Plan.    Primary Care Physician:  [name], MD Primary Gastroenterologist:  Dr Henrene Pastor    Reason for Consultation: Anemia, FOBT positive stool, hematemesis in setting of epistaxis.   HPI: [name] is a 85 y.o. male.  Hx IDDM.  CAD.  Cardiac stenting 1995. CABG.  LVEF 60 to 41%, grade 1 diastolic dysfunction on echo 04/2017. Elevated PSA, prostamegaly.  ? Prostate CA (no biopsy or path in Epic.  Profound hearing loss,  Cochlear implant 2018 has proven ineffective.   meds include ASA 325 mg daily.  No PPI etc.    History recurrent adenomatous colon polyps dating back > 2 decades.  Chronic gastritis. Last Colonoscopy 06/2011.  For adenoma surveillance.  4 small polyps removed (TAs without HGD/malignancy).  Sigmoid diverticulosis, internal hemorrhoids. Last EGD 07/2009.  For dyspepsia weight loss.  Atrophic gastric mucosa.  Submucosal fundal nodules.  GERD.  Recommended daily PPI.  However he is not taking daily PPI and does take a full dose aspirin  Seen in ED 12/28/18 for left sided epistaxis.  Resolved with pressure.  Hgb was 10.6 (11.7 in 07/2018) Epistaxis recurred again on 3/8.  He said it was a fairly large amount of blood.  On Monday he vomited dark, bloody material and passed a dark stool.  There was no abdominal pain.  Nausea and vomiting has since resolved.  No recurrent dark stool..  Presented to ED at the advisement of his PMD.  Denies SOB.  Good appetite.  Normally no nausea or reflux symptoms.  Has not been using NSAIDs other than the full dose aspirin.  Does not drink alcohol. Hgb 8.1 >> 7.4.   MCV 95.   FOBT +, dark stool on DRE.    PT/INR normal.   AKI at 55/1.5.   LFTs normal.    1 U RBCs  ordered and transfusing currently.  He is hungry and feels well.     Past Medical History:  Diagnosis Date  . Arthritis   . CAD (coronary artery disease)    post diaphragjatic wall infarction and subsequent coronary artery bypass graft surgery in 2003, now stable  . Diabetes mellitus   . Diastolic CHF ([place]) 6/60/6301   02/2013 Echo Grade I diastoilc dysfunction. Off lasix for several months 40mg - 04/14/16. Continue to monitor  . Elevated PSA   . GERD (gastroesophageal reflux disease)   . Hemorrhoids   . Hx of adenomatous colonic polyps   . Hyperlipidemia   . Hypertension   . Prostate cancer ([place])   . Urinary retention     Past Surgical History:  Procedure Laterality Date  . CORONARY ARTERY BYPASS GRAFT      Prior to Admission medications   Medication Sig Start Date End Date Taking? Authorizing Provider  aspirin (ECOTRIN) 325 MG EC tablet Take 325 mg by mouth daily.     Yes [provider]  benazepril (LOTENSIN) 40 MG tablet TAKE 1 TABLET EVERY DAY Patient  taking differently: Take 40 mg by mouth daily.  04/09/18  Yes [name], MD  hydrochlorothiazide (HYDRODIURIL) 25 MG tablet Take 1 tablet (25 mg total) by mouth daily. 08/13/18  Yes [name], MD  insulin NPH-regular Human (NOVOLIN 70/30) (70-30) 100 UNIT/ML injection Inject 20 units into the skin each morning and 18 units into the skin each evening. 04/09/18  Yes [name], MD  lovastatin (MEVACOR) 40 MG tablet Take 1 tablet (40 mg total) by mouth at bedtime. 01/02/17  Yes [name], MD  metFORMIN (GLUCOPHAGE) 500 MG tablet TAKE 1 TABLET TWICE DAILY WITH A MEAL Patient taking differently: Take 500 mg by mouth 2 (two) times daily with a meal.  12/17/18  Yes [name], MD  vitamin B-12 (CYANOCOBALAMIN) 100 MCG tablet Take 50 mcg by mouth daily.     Yes [provider]  vitamin C (ASCORBIC ACID) 500 MG tablet Take 500 mg by mouth daily.     Yes [provider]    ACCU-CHEK AVIVA PLUS test strip TEST BLOOD SUGAR TWICE DAILY 10/10/18   [name], MD  ACCU-CHEK SOFTCLIX LANCETS lancets USE  TO  TEST TWICE DAILY 07/17/18   [name], MD  Alcohol Swabs (B-D SINGLE USE SWABS REGULAR) PADS  08/03/17   [provider]  Insulin Pen Needle (BD PEN NEEDLE NANO U/F) 32G X 4 MM MISC USE TWICE DAILY. DX: E11.9 01/05/17   [name], MD  Insulin Syringe-Needle U-100 25G X 5/8" 1 ML MISC Use twice a day. May substitute as needed for relion insulin 70/30. 12/28/17   [name], MD  traZODone (DESYREL) 50 MG tablet Take 0.5-1 tablets (25-50 mg total) by mouth at bedtime as needed for sleep. Patient not taking: Reported on 12/31/2018 08/13/18   [name], MD    Scheduled Meds: . amoxicillin-clavulanate  1 tablet Oral Q12H  . insulin aspart  0-9 Units Subcutaneous Q4H  . insulin detemir  10 Units Subcutaneous BID  . olopatadine  1 drop Both Eyes BID  . pantoprazole  40 mg Intravenous Q12H  . pravastatin  40 mg Oral q1800  . sodium chloride flush  3 mL Intravenous Q12H  . tamsulosin  0.4 mg Oral Daily   Infusions:  PRN Meds: acetaminophen **OR** acetaminophen, HYDROcodone-acetaminophen, ondansetron **OR** ondansetron (ZOFRAN) IV   Allergies as of 12/31/2018  . (No Known Allergies)    Family History  Problem Relation Age of Onset  . Heart disease Mother   . Diabetes Mother     Social History   Socioeconomic History  . Marital status: Divorced    Spouse name: Not on file  . Number of children: Not on file  . Years of education: Not on file  . Highest education level: Not on file  Occupational History  . Occupation: Retired    Fish farm manager: [employer]  Social Needs  . Financial resource strain: Not on file  . Food insecurity:    Worry: Not on file    Inability: Not on file  . Transportation needs:    Medical: Not on file    Non-medical: Not on file  Tobacco Use  . Smoking status: Former Smoker     Last attempt to quit: 10/25/1948    Years since quitting: 70.2  . Smokeless tobacco: Never Used  Substance and Sexual Activity  . Alcohol use: No  . Drug use: No  . Sexual activity: Not Currently  Lifestyle  .  Physical activity:    Days per week: Not on file    Minutes per session: Not on file  . Stress: Not on file  Relationships  . Social connections:    Talks on phone: Not on file    Gets together: Not on file    Attends religious service: Not on file    Active member of club or organization: Not on file    Attends meetings of clubs or organizations: Not on file    Relationship status: Not on file  . Intimate partner violence:    Fear of current or ex partner: Not on file    Emotionally abused: Not on file    Physically abused: Not on file    Forced sexual activity: Not on file  Other Topics Concern  . Not on file  Social History Narrative   Divorced. 4 children. 7 grandkids. 2 greatgrandkids.       Retired from working at [employer] in [employer]: go to gym 3 days a week, tv and sports    REVIEW OF SYSTEMS: Constitutional: No fatigue or generalized weakness.  He is not very active but he can walk about his house and if he goes out he uses a cane or a walker. ENT: Epistaxis as per HPI.  Profoundly deaf, communication via writing. Pulm: Denies shortness of breath.  Denies cough. CV:  No palpitations, no LE edema.  GU:  No hematuria, no frequency GI: See HPI Heme: Other than the nosebleeds has not had any unusual or excessive bleeding or bruising. Transfusions: Receiving blood transfusion currently, Neuro:  No headaches, no peripheral tingling or numbness Derm:  No itching, no rash or sores.  Endocrine:  No sweats or chills.  No polyuria or dysuria Immunization: Viewed. Travel:  None beyond local counties in last few months.    PHYSICAL EXAM: Vital signs in last 24 hours: Vitals:   01/01/19 1212 01/01/19 1234  BP: 122/64 (!) 124/59  Pulse:  88 85  Resp: 18 18  Temp: 98.1 F (36.7 C) 98.2 F (36.8 C)  SpO2: 100% 100%   Wt Readings from Last 3 Encounters:  01/01/19 98.6 kg  12/31/18 100.5 kg  10/19/18 103.6 kg    General: Pleasant, elderly AAM.  Younger than 96.  NAD.  Sitting up in bed comfortable. Head: No facial asymmetry or swelling.   Eyes:  Redness and swelling around the left eye Ears: Deaf.  Communication, interview via written questions.  Patient's speech is quite clear, thorough and detailed answers to questions. Nose: No dried blood.  No fresh blood.  Did not perform speculum exam. Mouth: Moist, clear, pink oropharynx.  Tongue midline. Neck: No JVD, no masses, no thyromegaly. Lungs: Clear bilaterally without labored breathing.  No cough. Heart: RRR.  No MRG.  S1, S2 present. Abdomen: Soft.  Not tender, not distended.  Active bowel sounds.  No HSM.  + ventral hernia.     Rectal: Stool is brown.  Did not test for occult blood.  Firm, hard prostate. Musc/Skeltl: No joint redness or swelling. Extremities: Mild lower extremity edema without pitting. Neurologic: Fully oriented x3.  Moves all 4 limbs.  No tremor.  Limb strength not tested.  Deaf. Skin: No rash, no sores, no suspicious lesions. Nodes: No cervical adenopathy. Psych: Pleasant, cooperative, calm.  Intake/Output from previous day: 03/09 0701 - 03/10 0700 In: 375.7 [I.V.:275.4; IV Piggyback:100.3] Out: 0  Intake/Output this shift: Total I/O In: 696.1 [I.V.:696.1] Out:  1100 [Urine:1100]  LAB RESULTS: Recent Labs    12/31/18 1628 01/01/19 0508  WBC 11.8*  --   HGB 8.1* 7.4*  HCT 26.1* 22.8*  PLT 362  --    BMET Lab Results  Component Value Date   NA 135 01/01/2019   NA 135 12/31/2018   NA 138 08/13/2018   K 3.9 01/01/2019   K 4.5 12/31/2018   K 4.6 08/13/2018   CL 106 01/01/2019   CL 106 12/31/2018   CL 104 08/13/2018   CO2 22 01/01/2019   CO2 21 (L) 12/31/2018   CO2 26 08/13/2018   GLUCOSE 160 (H) 01/01/2019   GLUCOSE 188 (H)  12/31/2018   GLUCOSE 217 (H) 08/13/2018   BUN 54 (H) 01/01/2019   BUN 55 (H) 12/31/2018   BUN 21 08/13/2018   CREATININE 1.65 (H) 01/01/2019   CREATININE 1.56 (H) 12/31/2018   CREATININE 1.25 08/13/2018   CALCIUM 8.7 (L) 01/01/2019   CALCIUM 9.1 12/31/2018   CALCIUM 9.2 08/13/2018   LFT Recent Labs    12/31/18 1628  PROT 6.2*  ALBUMIN 3.5  AST 20  ALT 12  ALKPHOS 39  BILITOT 0.4   PT/INR Lab Results  Component Value Date   INR 1.1 12/31/2018     IMPRESSION:   *    Blood loss anemia, hematemesis, dark/FOBT positive stool in setting of epistaxis x 2 days.  Low suspicion of upper GI bleed. Takes full dose aspirin daily at home.  *    History of chronic GERD.   Not taking PPI at home.  *   AKI.  Resolved.  ACE on hold.  *     IDDM  *     Cellulitis, left eye.  Day 2 Augmentin.    PLAN:     *   Ordered carbohydrate modified diet.  *    Stopped IV Protonix, switched to PO Protonix 40 mg/day  *    Decision as to performing EGD per Dr.Cirigliano.  *   Recheck hemoglobins this evening, CBC in a.m.   Azucena Freed  01/01/2019, 12:55 PM Phone [phone]

## 2019-01-01 NOTE — Progress Notes (Signed)
[facility] PROGRESS NOTE  CARDIN NITSCHKE ION:[id number] DOB: 31-Aug-1921 DOA: 12/31/2018 PCP: [name], MD  Assessment/Plan:  1. Epistaxis; anemia; possible UGIB  Presented after an episode of bloody emesis in setting of epistaxis. Initially, it was reported that last epistaxis was on 3/8, leading to concern that red blood in emesis reflected acute UGIB, but patient's family reported that nose was bleeding just prior to arrival in ED. Hgb is 8.1 in ED, down from 10.6 on 12/28/18. No further nose bleeds. Provided with IV Protonix in ED.. fobt +. Presumably related to epistaxis however, marked drop in Hg concerning for gi bleed  - continue to hold ASA -transfuse 2 units PRBC's -serial cbc -continue protonix -npo -request gi consult has seen dr Henrene Pastor in past    2. acute kidney inury  SCr is 1.56 on admission and 1.65 this am.  up from most recent prior of 1.25 in October. Likely related to above.  - Hold ACE - hydrate gently while NPO  -monitor urine output 3. CAD  - No anginal complaints  - Continue statin, hold ASA    4. Insulin-dependent DM  - A1c was 7.5% in October  - Managed at home with metformin and Novolin 70/30 20 units qAM and 18 units qPM  - Check CBG's and use Levemir with Novolog correctional while in hospital   5. Cellulitis left eyelid  - EOMI and visual acuity intact, no proptosis or pain  - continue  Augmentin day #2  6. Urinary retention. No hx of same.  -renal US -urinalysis   Code Status: full Family Communication: none present Disposition Plan: home when ready   Consultants:  [facility] gi  Procedures:    Antibiotics:  augmentin day #2  HPI/Subjective: 85 yo very HOH admitted with anemia in setting of hematemesis and epistaxis. Hg 7.4 from 10.0 12/28/18. Not on nsaid or blood thinner.nor further bleeding since admission. Has suffered urinary retention since admission requiring in and out cath x2.   Objective: Vitals:   01/01/19 0451 01/01/19 0849  BP: (!) 146/61 138/73  Pulse: 88 90  Resp: 18 18  Temp: 97.6 F (36.4 C) 98.7 F (37.1 C)  SpO2: 100% 100%    Intake/Output Summary (Last 24 hours) at 01/01/2019 1049 Last data filed at 01/01/2019 1000 Gross per 24 hour  Intake 825.62 ml  Output 1100 ml  Net -274.38 ml   Filed Weights   01/01/19 0110  Weight: 98.6 kg    Exam:   General:  Awake alert very HOH in no acute distress  Cardiovascular: rrr no mgr no LE edema  Respiratory: normal effort BS clear but distant no wheeze  Abdomen: soft +BS no non-tender to palpation no guarding or rebounding  Musculoskeletal: joints without swelling/erythema   Data Reviewed: Basic Metabolic Panel: Recent Labs  Lab 12/31/18 1628 01/01/19 0508  NA 135 135  K 4.5 3.9  CL 106 106  CO2 21* 22  GLUCOSE 188* 160*  BUN 55* 54*  CREATININE 1.56* 1.65*  CALCIUM 9.1 8.7*   Liver Function Tests: Recent Labs  Lab 12/31/18 1628  AST 20  ALT 12  ALKPHOS 39  BILITOT 0.4  PROT 6.2*  ALBUMIN 3.5   No results for input(s): LIPASE, AMYLASE in the last 168 hours. No results for input(s): AMMONIA in the last 168 hours. CBC: Recent Labs  Lab 12/28/18 1859 12/31/18 1628 01/01/19 0508  WBC 8.6 11.8*  --   HGB 10.6* 8.1* 7.4*  HCT 34.0* 26.1*  22.8*  MCV 96.9 95.3  --   PLT 366 362  --    Cardiac Enzymes: No results for input(s): CKTOTAL, CKMB, CKMBINDEX, TROPONINI in the last 168 hours. BNP (last 3 results) No results for input(s): BNP in the last 8760 hours.  ProBNP (last 3 results) No results for input(s): PROBNP in the last 8760 hours.  CBG: Recent Labs  Lab 12/31/18 2241 01/01/19 0110 01/01/19 0429 01/01/19 0708  GLUCAP 155* 145* 122* 132*    No results found for this or any previous visit (from the past 240 hour(s)).   Studies: No results found.  Scheduled Meds: . sodium chloride   Intravenous Once  . amoxicillin-clavulanate  1 tablet Oral Q12H  . insulin aspart  0-9 Units  Subcutaneous Q4H  . insulin detemir  10 Units Subcutaneous BID  . olopatadine  1 drop Both Eyes BID  . pantoprazole  40 mg Intravenous Q12H  . pravastatin  40 mg Oral q1800  . sodium chloride flush  3 mL Intravenous Q12H  . tamsulosin  0.4 mg Oral Daily   Continuous Infusions: . sodium chloride 75 mL/hr at 01/01/19 0800    Principal Problem:   Hematemesis Active Problems:   Diabetes mellitus type II, controlled (HCC)   Epistaxis, recurrent   CAD (coronary artery disease)   Normocytic anemia   Urinary retention   Acute kidney injury ([place])   Cellulitis of left eyelid    Time spent: 36 minutes    [place] NP  [facility]  If 7PM-7AM, please contact night-coverage at [email or web address], password [facility] 01/01/2019, 10:49 AM  LOS: 0 days

## 2019-01-01 NOTE — ED Provider Notes (Signed)
[facility] EMERGENCY DEPARTMENT Provider Note   CSN: [id number] Arrival date & time: 12/31/18  1530    History   Chief Complaint Chief Complaint  Patient presents with  . Hematemesis    HPI [name] is a 85 y.o. male with history of CAD, diabetes, CHF, prostate cancer, hypertension who presents with hematemesis.  Patient had 2 bloody noses over the weekend and was evaluated on 12/31/2018 for the same.  He reported one episode of emesis today.  He has had generalized weakness over the past few days.  He denies any chest pain, shortness of breath, lightheadedness, dizziness, abdominal pain.  He reports he noticed dark stool yesterday.  He is normally ambulatory and walks with a cane.  He is completely deaf and requires writing to converse.  Most of history obtained from nephew at bedside.  Patient asked review of systems questions and other information with writing.     HPI  Past Medical History:  Diagnosis Date  . Arthritis   . CAD (coronary artery disease)   . CAD (coronary artery disease)    post diaphragjatic wall infarction and subsequent coronary artery bypass graft surgery in 2003, now stable  . Diabetes mellitus   . Diastolic CHF ([place]) 3/87/5643   02/2013 Echo Grade I diastoilc dysfunction. Off lasix for several months 40mg - 04/14/16. Continue to monitor  . Elevated PSA   . GERD (gastroesophageal reflux disease)   . Hemorrhoids   . Hx of adenomatous colonic polyps   . Hyperlipidemia   . Hypertension   . Prostate cancer [facility])     Patient Active Problem List   Diagnosis Date Noted  . Acute upper GI bleed 12/31/2018  . Insomnia 07/06/2017  . Pain in right ankle and joints of right foot 08/15/2016  . Diastolic dysfunction 32/95/1884  . Constipation 06/16/2010  . GERD 08/04/2009  . Prostate cancer ([place]) 08/21/2008  . Umbilical hernia 16/60/6301  . Hiatal hernia 02/13/2008  . DEGENERATIVE DISC DISEASE, CERVICAL SPINE 02/13/2008  . Diabetes  mellitus type II, controlled ([place]) 06/04/2007  . Hyperlipidemia associated with type 2 diabetes mellitus ([place]) 06/04/2007  . Hypertension associated with diabetes ([place]) 06/04/2007  . CAD (coronary artery disease) 06/04/2007    Past Surgical History:  Procedure Laterality Date  . CORONARY ARTERY BYPASS GRAFT          Home Medications    Prior to Admission medications   Medication Sig Start Date End Date Taking? Authorizing Provider  aspirin (ECOTRIN) 325 MG EC tablet Take 325 mg by mouth daily.     Yes [provider]  benazepril (LOTENSIN) 40 MG tablet TAKE 1 TABLET EVERY DAY Patient taking differently: Take 40 mg by mouth daily.  04/09/18  Yes [name], MD  hydrochlorothiazide (HYDRODIURIL) 25 MG tablet Take 1 tablet (25 mg total) by mouth daily. 08/13/18  Yes [name], MD  insulin NPH-regular Human (NOVOLIN 70/30) (70-30) 100 UNIT/ML injection Inject 20 units into the skin each morning and 18 units into the skin each evening. 04/09/18  Yes [name], MD  lovastatin (MEVACOR) 40 MG tablet Take 1 tablet (40 mg total) by mouth at bedtime. 01/02/17  Yes [name], MD  metFORMIN (GLUCOPHAGE) 500 MG tablet TAKE 1 TABLET TWICE DAILY WITH A MEAL Patient taking differently: Take 500 mg by mouth 2 (two) times daily with a meal.  12/17/18  Yes [name], MD  vitamin B-12 (CYANOCOBALAMIN) 100 MCG tablet Take 50 mcg  by mouth daily.     Yes [provider]  vitamin C (ASCORBIC ACID) 500 MG tablet Take 500 mg by mouth daily.     Yes [provider]  ACCU-CHEK AVIVA PLUS test strip TEST BLOOD SUGAR TWICE DAILY 10/10/18   [name], MD  ACCU-CHEK SOFTCLIX LANCETS lancets USE  TO  TEST TWICE DAILY 07/17/18   [name], MD  Alcohol Swabs (B-D SINGLE USE SWABS REGULAR) PADS  08/03/17   [provider]  Insulin Pen Needle (BD PEN NEEDLE NANO U/F) 32G X 4 MM MISC USE TWICE DAILY. DX: E11.9 01/05/17   [name],  MD  Insulin Syringe-Needle U-100 25G X 5/8" 1 ML MISC Use twice a day. May substitute as needed for relion insulin 70/30. 12/28/17   [name], MD  traZODone (DESYREL) 50 MG tablet Take 0.5-1 tablets (25-50 mg total) by mouth at bedtime as needed for sleep. Patient not taking: Reported on 12/31/2018 08/13/18   [name], MD    Family History Family History  Problem Relation Age of Onset  . Heart disease Mother   . Diabetes Mother     Social History Social History   Tobacco Use  . Smoking status: Former Smoker    Last attempt to quit: 10/25/1948    Years since quitting: 70.2  . Smokeless tobacco: Never Used  Substance Use Topics  . Alcohol use: No  . Drug use: No     Allergies   Patient has no known allergies.   Review of Systems Review of Systems  Constitutional: Positive for fatigue. Negative for chills and fever.  HENT: Positive for nosebleeds. Negative for facial swelling and sore throat.   Respiratory: Negative for shortness of breath.   Cardiovascular: Negative for chest pain.  Gastrointestinal: Positive for blood in stool. Negative for abdominal pain, nausea and vomiting.  Genitourinary: Negative for dysuria.  Musculoskeletal: Negative for back pain.  Skin: Negative for rash and wound.  Neurological: Negative for dizziness, light-headedness and headaches.  Psychiatric/Behavioral: The patient is not nervous/anxious.      Physical Exam Updated Vital Signs BP 129/63   Pulse 81   Temp 98.9 F (37.2 C) (Oral)   Resp 16   SpO2 94%   Physical Exam Vitals signs and nursing note reviewed.  Constitutional:      General: He is not in acute distress.    Appearance: He is well-developed. He is not diaphoretic.  HENT:     Head: Normocephalic and atraumatic.     Nose:     Comments: Some blood and the external L nare; no active bleeding in either nare    Mouth/Throat:     Pharynx: No oropharyngeal exudate.  Eyes:     General: No scleral icterus.        Right eye: No discharge.        Left eye: No discharge.     Conjunctiva/sclera: Conjunctivae normal.     Pupils: Pupils are equal, round, and reactive to light.  Neck:     Musculoskeletal: Normal range of motion and neck supple.     Thyroid: No thyromegaly.  Cardiovascular:     Rate and Rhythm: Normal rate and regular rhythm.     Heart sounds: Normal heart sounds. No murmur. No friction rub. No gallop.   Pulmonary:     Effort: Pulmonary effort is normal. No respiratory distress.     Breath sounds: Normal breath sounds. No stridor. No wheezing or rales.  Abdominal:     General: Bowel sounds are normal. There is no distension.     Palpations: Abdomen is soft.     Tenderness: There is no abdominal tenderness. There is no guarding or rebound.  Genitourinary:    Rectum: Guaiac result positive.     Comments: Melena Lymphadenopathy:     Cervical: No cervical adenopathy.  Skin:    General: Skin is warm and dry.     Coloration: Skin is not pale.     Findings: No rash.  Neurological:     Mental Status: He is alert.     Coordination: Coordination normal.      ED Treatments / Results  Labs (all labs ordered are listed, but only abnormal results are displayed) Labs Reviewed  COMPREHENSIVE METABOLIC PANEL - Abnormal; Notable for the following components:      Result Value   CO2 21 (*)    Glucose, Bld 188 (*)    BUN 55 (*)    Creatinine, Ser 1.56 (*)    Total Protein 6.2 (*)    GFR calc non Af Amer 37 (*)    GFR calc Af Amer 43 (*)    All other components within normal limits  CBC - Abnormal; Notable for the following components:   WBC 11.8 (*)    RBC 2.74 (*)    Hemoglobin 8.1 (*)    HCT 26.1 (*)    All other components within normal limits  POC OCCULT BLOOD, ED - Abnormal; Notable for the following components:   Fecal Occult Bld POSITIVE (*)    All other components within normal limits  CBG MONITORING, ED - Abnormal; Notable for the following components:    Glucose-Capillary 155 (*)    All other components within normal limits  PROTIME-INR  APTT  TYPE AND SCREEN  ABO/RH    EKG None  Radiology No results found.  Procedures Procedures (including critical care time)  Medications Ordered in ED Medications  pantoprazole (PROTONIX) 80 mg in sodium chloride 0.9 % 100 mL IVPB (has no administration in time range)  pantoprazole (PROTONIX) 80 mg in sodium chloride 0.9 % 250 mL (0.32 mg/mL) infusion (has no administration in time range)  pantoprazole (PROTONIX) injection 40 mg (has no administration in time range)  0.9 %  sodium chloride infusion ( Intravenous New Bag/Given 12/31/18 2249)     Initial Impression / Assessment and Plan / ED Course  I have reviewed the triage vital signs and the nursing notes.  Pertinent labs & imaging results that were available during my care of the patient were reviewed by me and considered in my medical decision making (see chart for details).        Patient with probable upper GI bleed versus epistaxis.  Patient with a 4 g drop in hemoglobin from baseline and 2 g drop in the past 3 days.  Patient is generally weak.  CMP shows BUN 55, creatinine 1.56.  Elevated BUN concerning more for upper GI bleed.  Protonix initiated.  Maintenance fluids initiated.  I discussed with Dr. Myna Hidalgo who accepts patient for admission.  I appreciate his assistance with the patient.  Patient also evaluated by my attending, Dr. Ronnald Nian, who guided the patient's management and agrees with plan.  Final Clinical Impressions(s) / ED Diagnoses   Final diagnoses:  Acute upper GI bleed    ED Discharge Orders    None       Frederica Kuster, PA-C 01/01/19 0023    Lennice Sites,  DO 01/01/19 0114

## 2019-01-01 NOTE — ED Notes (Signed)
Admitting @ bedside

## 2019-01-01 NOTE — H&P (Addendum)
History and Physical    [name] [id number] DOB: 10-28-1920 DOA: 12/31/2018  PCP: [name], MD   Patient coming from: Home   Chief Complaint: Nose bleeds, generalized weakness, vomited blood   HPI: [name] is a 85 y.o. male with medical history significant for coronary artery disease, insulin-dependent diabetes mellitus, hypertension, and GERD, now presenting to the emergency department for evaluation of generalized weakness, nosebleeds, and bloody emesis.  Patient reports recurrent epistaxis recently, was seen in the emergency department for evaluation of this on 12/28/2018, was found to be stable at that time and discharged home.  He went on to have recurrent nosebleed on 12/30/2018 that resolved spontaneously.  Since that time, he has had some generalized weakness but denies any chest pain, lightheadedness, or headache.  He denies any abdominal pain or indigestion.  He woke this morning, was experiencing nausea and had an episode of bloody vomiting x1.  He reported this to his primary care physician and was directed to the ED for further evaluation.  Initially, it was reported that the patient has not had any epistaxis since 12/30/2018, and so there was concern that the bloody vomiting could be an acute GI bleed.  On further discussion, patient reports that his nose was bleeding shortly prior to arrival in the ED, and his nephew also reports seeing some bleeding from his nose when he first arrived in the ED tonight, but that has since stopped spontaneously.  ED Course: Upon arrival to the ED, patient is found to be afebrile, saturating well on room air, and with vitals otherwise normal.  Chemistry panel is notable for a BUN of 55 and creatinine of 1.56, from 1.25 in October.  CBC is notable for leukocytosis to 11,800 and hemoglobin of 8.1, down from 10.6 days earlier.  Fecal occult blood testing is positive.  Patient was started on IV Protonix in the ED and hospitalists asked  to admit.  Review of Systems:  All other systems reviewed and apart from HPI, are negative.  Past Medical History:  Diagnosis Date  . Arthritis   . CAD (coronary artery disease)   . CAD (coronary artery disease)    post diaphragjatic wall infarction and subsequent coronary artery bypass graft surgery in 2003, now stable  . Diabetes mellitus   . Diastolic CHF ([place]) 2/69/4854   02/2013 Echo Grade I diastoilc dysfunction. Off lasix for several months 40mg - 04/14/16. Continue to monitor  . Elevated PSA   . GERD (gastroesophageal reflux disease)   . Hemorrhoids   . Hx of adenomatous colonic polyps   . Hyperlipidemia   . Hypertension   . Prostate cancer [facility])     Past Surgical History:  Procedure Laterality Date  . CORONARY ARTERY BYPASS GRAFT       reports that he quit smoking about 70 years ago. He has never used smokeless tobacco. He reports that he does not drink alcohol or use drugs.  No Known Allergies  Family History  Problem Relation Age of Onset  . Heart disease Mother   . Diabetes Mother      Prior to Admission medications   Medication Sig Start Date End Date Taking? Authorizing Provider  aspirin (ECOTRIN) 325 MG EC tablet Take 325 mg by mouth daily.     Yes [provider]  benazepril (LOTENSIN) 40 MG tablet TAKE 1 TABLET EVERY DAY Patient taking differently: Take 40 mg by mouth daily.  04/09/18  Yes [name], MD  hydrochlorothiazide (HYDRODIURIL)  25 MG tablet Take 1 tablet (25 mg total) by mouth daily. 08/13/18  Yes [name], MD  insulin NPH-regular Human (NOVOLIN 70/30) (70-30) 100 UNIT/ML injection Inject 20 units into the skin each morning and 18 units into the skin each evening. 04/09/18  Yes [name], MD  lovastatin (MEVACOR) 40 MG tablet Take 1 tablet (40 mg total) by mouth at bedtime. 01/02/17  Yes [name], MD  metFORMIN (GLUCOPHAGE) 500 MG tablet TAKE 1 TABLET TWICE DAILY WITH A MEAL Patient taking differently:  Take 500 mg by mouth 2 (two) times daily with a meal.  12/17/18  Yes [name], MD  vitamin B-12 (CYANOCOBALAMIN) 100 MCG tablet Take 50 mcg by mouth daily.     Yes [provider]  vitamin C (ASCORBIC ACID) 500 MG tablet Take 500 mg by mouth daily.     Yes [provider]  ACCU-CHEK AVIVA PLUS test strip TEST BLOOD SUGAR TWICE DAILY 10/10/18   [name], MD  ACCU-CHEK SOFTCLIX LANCETS lancets USE  TO  TEST TWICE DAILY 07/17/18   [name], MD  Alcohol Swabs (B-D SINGLE USE SWABS REGULAR) PADS  08/03/17   [provider]  Insulin Pen Needle (BD PEN NEEDLE NANO U/F) 32G X 4 MM MISC USE TWICE DAILY. DX: E11.9 01/05/17   [name], MD  Insulin Syringe-Needle U-100 25G X 5/8" 1 ML MISC Use twice a day. May substitute as needed for relion insulin 70/30. 12/28/17   [name], MD  traZODone (DESYREL) 50 MG tablet Take 0.5-1 tablets (25-50 mg total) by mouth at bedtime as needed for sleep. Patient not taking: Reported on 12/31/2018 08/13/18   [name], MD    Physical Exam: Vitals:   12/31/18 1625 12/31/18 2043 12/31/18 2230 01/01/19 0110  BP: 122/61 (!) 144/69 129/63 (!) 142/68  Pulse: 98 91 81 89  Resp: 16 18 16 18   Temp: 98.9 F (37.2 C)   97.8 F (36.6 C)  TempSrc: Oral     SpO2: 100% 100% 94% 98%  Weight:    98.6 kg    Constitutional: NAD, calm  Eyes: PERTLA, lids and conjunctivae normal ENMT: Mucous membranes are moist. Posterior pharynx clear of any exudate or lesions.   Neck: normal, supple, no masses, no thyromegaly Respiratory: clear to auscultation bilaterally, no wheezing, no crackles. Normal respiratory effort.    Cardiovascular: S1 & S2 heard, regular rate and rhythm. Pretibial pitting edema bilaterally. Abdomen: No distension, no tenderness, soft. Bowel sounds normal.  Musculoskeletal: no clubbing / cyanosis. No joint deformity upper and lower extremities.   Skin: mild erythema and edema to left upper eyelid  surrounding a superficial abrasion. Warm, dry, well-perfused. Neurologic: No gross facial asymmetry. Gross hearing deficit. Sensation intact. Moving all extremities.  Psychiatric: Alert and oriented to person, place, and situation. Pleasant, cooperative.     Labs on Admission: I have personally reviewed following labs and imaging studies  CBC: Recent Labs  Lab 12/28/18 1859 12/31/18 1628  WBC 8.6 11.8*  HGB 10.6* 8.1*  HCT 34.0* 26.1*  MCV 96.9 95.3  PLT 366 992   Basic Metabolic Panel: Recent Labs  Lab 12/31/18 1628  NA 135  K 4.5  CL 106  CO2 21*  GLUCOSE 188*  BUN 55*  CREATININE 1.56*  CALCIUM 9.1   GFR: Estimated Creatinine Clearance: 31.5 mL/min (A) (by C-G formula based on SCr of 1.56 mg/dL (H)). Liver Function Tests: Recent Labs  Lab  12/31/18 1628  AST 20  ALT 12  ALKPHOS 39  BILITOT 0.4  PROT 6.2*  ALBUMIN 3.5   No results for input(s): LIPASE, AMYLASE in the last 168 hours. No results for input(s): AMMONIA in the last 168 hours. Coagulation Profile: Recent Labs  Lab 12/31/18 2155  INR 1.1   Cardiac Enzymes: No results for input(s): CKTOTAL, CKMB, CKMBINDEX, TROPONINI in the last 168 hours. BNP (last 3 results) No results for input(s): PROBNP in the last 8760 hours. HbA1C: No results for input(s): HGBA1C in the last 72 hours. CBG: Recent Labs  Lab 12/31/18 2241 01/01/19 0110  GLUCAP 155* 145*   Lipid Profile: No results for input(s): CHOL, HDL, LDLCALC, TRIG, CHOLHDL, LDLDIRECT in the last 72 hours. Thyroid Function Tests: No results for input(s): TSH, T4TOTAL, FREET4, T3FREE, THYROIDAB in the last 72 hours. Anemia Panel: No results for input(s): VITAMINB12, FOLATE, FERRITIN, TIBC, IRON, RETICCTPCT in the last 72 hours. Urine analysis:    Component Value Date/Time   COLORURINE RED (A) 03/29/2014 0909   APPEARANCEUR CLOUDY (A) 03/29/2014 0909   LABSPEC 1.015 03/29/2014 0909   PHURINE 7.0 03/29/2014 0909   GLUCOSEU NEGATIVE  03/29/2014 0909   HGBUR LARGE (A) 03/29/2014 0909   HGBUR negative 06/05/2007 0947   BILIRUBINUR NEGATIVE 03/29/2014 0909   BILIRUBINUR n 11/16/2011 0836   KETONESUR 15 (A) 03/29/2014 0909   PROTEINUR 30 (A) 03/29/2014 0909   UROBILINOGEN 0.2 03/29/2014 0909   NITRITE NEGATIVE 03/29/2014 0909   LEUKOCYTESUR MODERATE (A) 03/29/2014 0909   Sepsis Labs: @LABRCNTIP (procalcitonin:4,lacticidven:4) )No results found for this or any previous visit (from the past 240 hour(s)).   Radiological Exams on Admission: No results found.  EKG: Not performed.   Assessment/Plan   1. Epistaxis; anemia; possible UGIB    - Presents after an episode of bloody emesis in setting of epistaxis  - Initially, it was reported that last epistaxis was on 3/8, leading to concern that red blood in vomitus could reflect acute UGIB, but patient's family reports that nose was bleeding just prior to arrival in ED  - Hgb is 8.1 in ED, down from 10.6 on 12/28/18  - There is no active bleeding at time of admission  - Treated with IV Protonix in ED, will continue for now though this is most likely epistaxis  - Hold ASA, type and screen, trend H&H, monitor for recurrent bleeding    2. CKD III  - SCr is 1.56 on admission, up from most recent prior of 1.25 in October  - Hold ACE, hydrate gently while NPO, renally-dose medications   3. CAD  - No anginal complaints  - Continue statin, hold ASA    4. Insulin-dependent DM  - A1c was 7.5% in October  - Managed at home with metformin and Novolin 70/30 20 units qAM and 18 units qPM  - Check CBG's and use Levemir with Novolog correctional while in hospital   5. Cellulitis left eyelid  - EOMI and visual acuity intact, no proptosis or pain  - Start Augmentin     DVT prophylaxis: SCD's  Code Status: Full  Family Communication: Nephew updated at bedside Consults called: None Admission status: Observation     Vianne Bulls, MD [facility] Pager  [phone]  If 7PM-7AM, please contact night-coverage [email or web address] Password [email or web address]  01/01/2019, 1:29 AM

## 2019-01-01 NOTE — Care Management Obs Status (Signed)
[place] NOTIFICATION   Patient Details  Name: OBADIAH DENNARD MRN: [id number] Date of Birth: [birth year]   Medicare Observation Status Notification Given:  Yes    Bartholomew Crews, RN 01/01/2019, 5:12 PM

## 2019-01-01 NOTE — Progress Notes (Signed)
Bladder scan performed revealed >718ml, Baltazar Najjar, NP was paged and ordered to do in and out catheterization. In and out catheterization performed with output of 269ml. Patient verbalized some relief. Suzy Bouchard, RN

## 2019-01-02 DIAGNOSIS — K920 Hematemesis: Secondary | ICD-10-CM | POA: Diagnosis not present

## 2019-01-02 LAB — TYPE AND SCREEN
ABO/RH(D): O POS
Antibody Screen: NEGATIVE
Unit division: 0
Unit division: 0

## 2019-01-02 LAB — BPAM RBC
Blood Product Expiration Date: 202004042359
Blood Product Expiration Date: 202004052359
ISSUE DATE / TIME: 202003101214
ISSUE DATE / TIME: 202003101612
Unit Type and Rh: 5100
Unit Type and Rh: 5100

## 2019-01-02 LAB — GLUCOSE, CAPILLARY
Glucose-Capillary: 127 mg/dL — ABNORMAL HIGH (ref 70–99)
Glucose-Capillary: 141 mg/dL — ABNORMAL HIGH (ref 70–99)
Glucose-Capillary: 154 mg/dL — ABNORMAL HIGH (ref 70–99)
Glucose-Capillary: 265 mg/dL — ABNORMAL HIGH (ref 70–99)
Glucose-Capillary: 64 mg/dL — ABNORMAL LOW (ref 70–99)

## 2019-01-02 LAB — HEMOGLOBIN A1C
Hgb A1c MFr Bld: 6.8 % — ABNORMAL HIGH (ref 4.8–5.6)
Mean Plasma Glucose: 148.46 mg/dL

## 2019-01-02 LAB — CBC
HCT: 26.8 % — ABNORMAL LOW (ref 39.0–52.0)
Hemoglobin: 8.5 g/dL — ABNORMAL LOW (ref 13.0–17.0)
MCH: 29.6 pg (ref 26.0–34.0)
MCHC: 31.7 g/dL (ref 30.0–36.0)
MCV: 93.4 fL (ref 80.0–100.0)
Platelets: 266 10*3/uL (ref 150–400)
RBC: 2.87 MIL/uL — ABNORMAL LOW (ref 4.22–5.81)
RDW: 14.9 % (ref 11.5–15.5)
WBC: 9 10*3/uL (ref 4.0–10.5)
nRBC: 0 % (ref 0.0–0.2)

## 2019-01-02 LAB — BASIC METABOLIC PANEL
Anion gap: 5 (ref 5–15)
BUN: 40 mg/dL — ABNORMAL HIGH (ref 8–23)
CO2: 25 mmol/L (ref 22–32)
Calcium: 9 mg/dL (ref 8.9–10.3)
Chloride: 110 mmol/L (ref 98–111)
Creatinine, Ser: 1.49 mg/dL — ABNORMAL HIGH (ref 0.61–1.24)
GFR calc Af Amer: 45 mL/min — ABNORMAL LOW (ref >60)
GFR calc non Af Amer: 39 mL/min — ABNORMAL LOW (ref >60)
Glucose, Bld: 68 mg/dL — ABNORMAL LOW (ref 70–99)
Potassium: 4.3 mmol/L (ref 3.5–5.1)
Sodium: 140 mmol/L (ref 135–145)

## 2019-01-02 LAB — URINALYSIS, ROUTINE W REFLEX MICROSCOPIC
Bilirubin Urine: NEGATIVE
Glucose, UA: NEGATIVE mg/dL
Ketones, ur: NEGATIVE mg/dL
Nitrite: NEGATIVE
Protein, ur: NEGATIVE mg/dL
Specific Gravity, Urine: 1.014 (ref 1.005–1.030)
WBC, UA: >50 WBC/hpf — ABNORMAL HIGH (ref 0–5)
pH: 5 (ref 5.0–8.0)

## 2019-01-02 MED ORDER — INSULIN NPH ISOPHANE & REGULAR (70-30) 100 UNIT/ML ~~LOC~~ SUSP: SUBCUTANEOUS | 5 refills | Status: DC

## 2019-01-02 MED ORDER — FERROUS SULFATE 325 (65 FE) MG PO TABS: 325.0000 mg | ORAL_TABLET | Freq: Two times a day (BID) | ORAL | Status: DC

## 2019-01-02 MED ORDER — INSULIN DETEMIR 100 UNIT/ML ~~LOC~~ SOLN: 10.0000 [IU] | Freq: Every day | SUBCUTANEOUS | Status: DC

## 2019-01-02 MED ORDER — PANTOPRAZOLE SODIUM 40 MG PO TBEC: 40.0000 mg | DELAYED_RELEASE_TABLET | Freq: Every day | ORAL | 0 refills | Status: AC

## 2019-01-02 MED ORDER — LOVASTATIN 40 MG PO TABS: 40.0000 mg | ORAL_TABLET | Freq: Every day | ORAL | 0 refills | Status: DC

## 2019-01-02 MED ORDER — TAMSULOSIN HCL 0.4 MG PO CAPS: 0.4000 mg | ORAL_CAPSULE | Freq: Every day | ORAL | 0 refills | Status: DC

## 2019-01-02 MED ORDER — FERROUS SULFATE 325 (65 FE) MG PO TABS: 325.0000 mg | ORAL_TABLET | Freq: Two times a day (BID) | ORAL | 3 refills | Status: DC

## 2019-01-02 MED ORDER — AMOXICILLIN-POT CLAVULANATE 875-125 MG PO TABS: 1.0000 | ORAL_TABLET | Freq: Two times a day (BID) | ORAL | 0 refills | Status: AC

## 2019-01-02 NOTE — Discharge Summary (Signed)
Physician Discharge Summary  [name] ZSW:[id number] DOB: 10/20/1921 DOA: 12/31/2018  PCP: [name], MD  Admit date: 12/31/2018 Discharge date: 01/02/2019  Admitted From: Home Disposition: Home  Recommendations for Outpatient Follow-up:  1. Follow up with PCP in 1-2 weeks 2. Please obtain BMP/CBC in one week 3. Following resolution of periorbital cellulitis. 4. Consider resumption of aspirin if hemoglobin is stable and no further nosebleeds. 5. Needs follow up with urology for prostate gland enl;argement.     Discharge Condition: Stable CODE STATUS: Full code Diet recommendation: Heart Healthy  Brief/Interim Summary: [name] is a 85 y.o. male with a Past Medical History of CAD, IDDM, HTN, GERD, who presented with generalized weakness found to have nosebleeds and bloody emesis.  Hemoglobin dropped as low as 7.4 today, and previous baseline 10.6 on 3/6. Received 2 units of packed red blood cells.  Suspect symptoms could be related to nosebleed, but GI consulted to rule out the possibility of upper GI bleed given significant drop in blood counts with elevated BUN.  Holding aspirin and will continue to monitor hemoglobin transfusing blood products as needed.  I was consulted, they felt that her drop in hemoglobin and occult blood positive was related to epistaxis.  Patient hemoglobin today is stable at 8.5.  No feeder epistaxis.  1-Epistaxis, anemia. Patient received 2 units of packed red blood cell. Her hemoglobin today at 8.5 stable. He was evaluated by GI who thought that the dropping in hemoglobin was related to epistaxis and no GI bleed. Recommend for patient to be on PPI and more if he is going to be on prophylaxis aspirin. Will hold aspirin for 3 to 5 days. Patient will need to follow-up with PCP to repeat hemoglobin level. I have also started him on iron supplement. Stable for discharge.  AKI: Creatinine at 1.5 on admission.  Continue to hold ACE.  He  received IV fluids.  Improved  Coronary artery disease.  Continue with a statin.  Hold aspirin for 3-5 more days.  Diabetes: He had a low CBG during hospitalization.  I will decrease failure his 7030 at discharge.  Cellulitis of the left eye periorbital.  He was a started on Augmentin.  It has improved.  Will prescribe 5 more days of Augmentin.  He received 2 days of the antibiotics in the hospital.  Urinary retention. Per report patient does In and Out at home. Continue with flomax. [place] with enlarge prostate gland. He needs follow up with urology/       Discharge Diagnoses:  Principal Problem:   Hematemesis Active Problems:   Diabetes mellitus type II, controlled ([place])   CAD (coronary artery disease)   Epistaxis, recurrent   Normocytic anemia   Cellulitis of left eyelid   Urinary retention   Acute kidney injury ([place])   Heme positive stool    Discharge Instructions  Discharge Instructions    Diet - low sodium heart healthy   Complete by:  As directed    Increase activity slowly   Complete by:  As directed      Allergies as of 01/02/2019   No Known Allergies     Medication List    STOP taking these medications   benazepril 40 MG tablet Commonly known as:  LOTENSIN   Ecotrin 325 MG EC tablet Generic drug:  aspirin   hydrochlorothiazide 25 MG tablet Commonly known as:  HYDRODIURIL   Insulin Syringe-Needle U-100 25G X 5/8" 1 ML Misc   traZODone 50 MG  tablet Commonly known as:  DESYREL     TAKE these medications   Accu-Chek Aviva Plus test strip Generic drug:  glucose blood TEST BLOOD SUGAR TWICE DAILY   Accu-Chek Softclix Lancets lancets USE  TO  TEST TWICE DAILY   amoxicillin-clavulanate 875-125 MG tablet Commonly known as:  AUGMENTIN Take 1 tablet by mouth every 12 (twelve) hours for 5 days.   B-D SINGLE USE SWABS REGULAR Pads   ferrous sulfate 325 (65 FE) MG tablet Take 1 tablet (325 mg total) by mouth 2 (two) times daily with a meal.    insulin NPH-regular Human (70-30) 100 UNIT/ML injection Commonly known as:  NovoLIN 70/30 Inject 12 units into the skin each morning and 6 units into the skin each evening. What changed:  additional instructions   Insulin Pen Needle 32G X 4 MM Misc Commonly known as:  BD Pen Needle Nano U/F USE TWICE DAILY. DX: E11.9   lovastatin 40 MG tablet Commonly known as:  MEVACOR Take 1 tablet (40 mg total) by mouth at bedtime.   metFORMIN 500 MG tablet Commonly known as:  GLUCOPHAGE TAKE 1 TABLET TWICE DAILY WITH A MEAL What changed:  See the new instructions.   pantoprazole 40 MG tablet Commonly known as:  PROTONIX Take 1 tablet (40 mg total) by mouth daily at 6 (six) AM. Start taking on:  January 03, 2019   tamsulosin 0.4 MG Caps capsule Commonly known as:  FLOMAX Take 1 capsule (0.4 mg total) by mouth daily. Start taking on:  January 03, 2019   vitamin B-12 100 MCG tablet Commonly known as:  CYANOCOBALAMIN Take 50 mcg by mouth daily.   vitamin C 500 MG tablet Commonly known as:  ASCORBIC ACID Take 500 mg by mouth daily.      Follow-up Information    [name], MD Follow up in 3 day(s).   Specialty:  Family Medicine Contact information: [place] [id number] [phone]          No Known Allergies  Consultations:  none   Procedures/Studies: US Renal  Result Date: 01/01/2019 CLINICAL DATA:  Urinary retention. EXAM: RENAL / URINARY TRACT ULTRASOUND COMPLETE COMPARISON:  CT of the pelvis 09/07/2012 FINDINGS: Right Kidney: Renal measurements: 9.7 x 4.2 x 5.2 cm = volume: 111 mL . Echogenicity within normal limits. No mass or hydronephrosis visualized. Left Kidney: Renal measurements: 10.6 x 5.7 x 4.5 cm = volume: 142 mL. Echogenicity within normal limits. No mass or hydronephrosis visualized. Bladder: Appears normal for degree of bladder distention. Marked enlargement of the prostate gland which measures 8.9 x 7.5 x 8.1 cm, which compresses  the base of the urinary bladder. IMPRESSION: Normal appearance of the kidneys . Marked enlargement of the prostate gland, which compresses the base of the urinary bladder. Please correlate to serum PSA values. Electronically Signed   By: Fidela Salisbury M.D.   On: 01/01/2019 15:41    Subjective: He is deaf. No further nose  Bleed he is feeling well.   Discharge Exam: Vitals:   01/01/19 2041 01/02/19 0934  BP: (!) 143/73 129/66  Pulse: 94 85  Resp: 18 18  Temp: 97.8 F (36.6 C) 98.2 F (36.8 C)  SpO2: 100% 97%     General: Pt is alert, awake, not in acute distress Cardiovascular: RRR, S1/S2 +, no rubs, no gallops Respiratory: CTA bilaterally, no wheezing, no rhonchi Abdominal: Soft, NT, ND, bowel sounds + Extremities: no edema, no cyanosis    The results  of significant diagnostics from this hospitalization (including imaging, microbiology, ancillary and laboratory) are listed below for reference.     Microbiology: No results found for this or any previous visit (from the past 240 hour(s)).   Labs: BNP (last 3 results) No results for input(s): BNP in the last 8760 hours. Basic Metabolic Panel: Recent Labs  Lab 12/31/18 1628 01/01/19 0508 01/02/19 0347  NA 135 135 140  K 4.5 3.9 4.3  CL 106 106 110  CO2 21* 22 25  GLUCOSE 188* 160* 68*  BUN 55* 54* 40*  CREATININE 1.56* 1.65* 1.49*  CALCIUM 9.1 8.7* 9.0   Liver Function Tests: Recent Labs  Lab 12/31/18 1628  AST 20  ALT 12  ALKPHOS 39  BILITOT 0.4  PROT 6.2*  ALBUMIN 3.5   No results for input(s): LIPASE, AMYLASE in the last 168 hours. No results for input(s): AMMONIA in the last 168 hours. CBC: Recent Labs  Lab 12/28/18 1859 12/31/18 1628 01/01/19 0508 01/01/19 2155 01/02/19 0347  WBC 8.6 11.8*  --   --  9.0  HGB 10.6* 8.1* 7.4* 8.5* 8.5*  HCT 34.0* 26.1* 22.8* 26.1* 26.8*  MCV 96.9 95.3  --   --  93.4  PLT 366 362  --   --  266   Cardiac Enzymes: No results for input(s): CKTOTAL, CKMB,  CKMBINDEX, TROPONINI in the last 168 hours. BNP: Invalid input(s): POCBNP CBG: Recent Labs  Lab 01/01/19 2346 01/02/19 0343 01/02/19 0439 01/02/19 0700 01/02/19 1132  GLUCAP 127* 64* 141* 154* 265*   D-Dimer No results for input(s): DDIMER in the last 72 hours. Hgb A1c Recent Labs    01/02/19 0347  HGBA1C 6.8*   Lipid Profile No results for input(s): CHOL, HDL, LDLCALC, TRIG, CHOLHDL, LDLDIRECT in the last 72 hours. Thyroid function studies No results for input(s): TSH, T4TOTAL, T3FREE, THYROIDAB in the last 72 hours.  Invalid input(s): FREET3 Anemia work up No results for input(s): VITAMINB12, FOLATE, FERRITIN, TIBC, IRON, RETICCTPCT in the last 72 hours. Urinalysis    Component Value Date/Time   COLORURINE YELLOW 01/01/2019 0118   APPEARANCEUR HAZY (A) 01/01/2019 0118   LABSPEC 1.014 01/01/2019 0118   PHURINE 5.0 01/01/2019 0118   GLUCOSEU NEGATIVE 01/01/2019 0118   HGBUR SMALL (A) 01/01/2019 0118   HGBUR negative 06/05/2007 0947   BILIRUBINUR NEGATIVE 01/01/2019 0118   BILIRUBINUR n 11/16/2011 0836   KETONESUR NEGATIVE 01/01/2019 0118   PROTEINUR NEGATIVE 01/01/2019 0118   UROBILINOGEN 0.2 03/29/2014 0909   NITRITE NEGATIVE 01/01/2019 0118   LEUKOCYTESUR LARGE (A) 01/01/2019 0118   Sepsis Labs Invalid input(s): PROCALCITONIN,  WBC,  LACTICIDVEN Microbiology No results found for this or any previous visit (from the past 240 hour(s)).   Time coordinating discharge: 40 minutes  SIGNED:   Elmarie Shiley, MD  [facility]

## 2019-01-02 NOTE — Progress Notes (Signed)
[name] to be D/C'd Home per MD order.  Discussed prescriptions and follow up appointments with the son. Prescriptions given to patient, medication list explained in detail. Son verbalized understanding.  Allergies as of 01/02/2019   No Known Allergies     Medication List    STOP taking these medications   benazepril 40 MG tablet Commonly known as:  LOTENSIN   Ecotrin 325 MG EC tablet Generic drug:  aspirin   hydrochlorothiazide 25 MG tablet Commonly known as:  HYDRODIURIL   Insulin Syringe-Needle U-100 25G X 5/8" 1 ML Misc   traZODone 50 MG tablet Commonly known as:  DESYREL     TAKE these medications   Accu-Chek Aviva Plus test strip Generic drug:  glucose blood TEST BLOOD SUGAR TWICE DAILY   Accu-Chek Softclix Lancets lancets USE  TO  TEST TWICE DAILY   amoxicillin-clavulanate 875-125 MG tablet Commonly known as:  AUGMENTIN Take 1 tablet by mouth every 12 (twelve) hours for 5 days.   B-D SINGLE USE SWABS REGULAR Pads   ferrous sulfate 325 (65 FE) MG tablet Take 1 tablet (325 mg total) by mouth 2 (two) times daily with a meal.   insulin NPH-regular Human (70-30) 100 UNIT/ML injection Commonly known as:  NovoLIN 70/30 Inject 12 units into the skin each morning and 6 units into the skin each evening. What changed:  additional instructions   Insulin Pen Needle 32G X 4 MM Misc Commonly known as:  BD Pen Needle Nano U/F USE TWICE DAILY. DX: E11.9   lovastatin 40 MG tablet Commonly known as:  MEVACOR Take 1 tablet (40 mg total) by mouth at bedtime.   metFORMIN 500 MG tablet Commonly known as:  GLUCOPHAGE TAKE 1 TABLET TWICE DAILY WITH A MEAL What changed:  See the new instructions.   pantoprazole 40 MG tablet Commonly known as:  PROTONIX Take 1 tablet (40 mg total) by mouth daily at 6 (six) AM. Start taking on:  January 03, 2019   tamsulosin 0.4 MG Caps capsule Commonly known as:  FLOMAX Take 1 capsule (0.4 mg total) by mouth daily. Start taking  on:  January 03, 2019   vitamin B-12 100 MCG tablet Commonly known as:  CYANOCOBALAMIN Take 50 mcg by mouth daily.   vitamin C 500 MG tablet Commonly known as:  ASCORBIC ACID Take 500 mg by mouth daily.       Vitals:   01/01/19 2041 01/02/19 0934  BP: (!) 143/73 129/66  Pulse: 94 85  Resp: 18 18  Temp: 97.8 F (36.6 C) 98.2 F (36.8 C)  SpO2: 100% 97%    Skin clean, dry and intact without evidence of skin break down, no evidence of skin tears noted. IV catheter discontinued intact. Site without signs and symptoms of complications. Dressing and pressure applied. Pt denies pain at this time. No complaints noted.  An After Visit Summary was printed and given to the patient and son at bedside Patient escorted via [facility], and D/C home via private auto.

## 2019-01-02 NOTE — Progress Notes (Signed)
Physical Therapy Treatment Patient Details Name: [name] MRN: [id number] DOB: 1920/12/22 Today's Date: 01/02/2019    History of Present Illness Pt is a 85 y/o male with a PMH significant for IDDM, CAD, cardiac stenting 1995, CABG, LVEF 60-65%, elevated PSA, prostamegaly, questionable prostate CA, deaf s/p cochlear implant 2018 that was ineffective. Pt presented to the ED 3/6 for L side epistaxis (resolved), and recurred again on 3/8. Pt then vomited dark bloody material and passed a dark stool. Hg down to 7.4 and pt was transfused 1 unit of RBCs.     PT Comments    Pt admitted with above diagnosis. Pt currently with functional limitations due to the deficits listed below (see PT Problem List). At the time of PT eval pt was able to perform transfers initially with supervision, progressing to min assist by end of session. Pt required HHA as well as SPC for safe ambulation, and feel a RW may be beneficial to decrease risk for falls. Pt agreeable to HHPT at d/c to improve independence and tolerance for functional activity. Pt will benefit from skilled PT to increase their independence and safety with mobility to allow discharge to the venue listed below.      Follow Up Recommendations  Home health PT;Supervision for mobility/OOB     Equipment Recommendations  Rolling walker with 5" wheels    Recommendations for Other Services       Precautions / Restrictions Precautions Precautions: Fall Precaution Comments: Essentially deaf. Communication has been through paper and pen.  Restrictions Weight Bearing Restrictions: No    Mobility  Bed Mobility Overal bed mobility: Needs Assistance Bed Mobility: Supine to Sit     Supine to sit: Supervision     General bed mobility comments: Increased time. No assist required outside of getting covers off of feet  Transfers Overall transfer level: Needs assistance Equipment used: Straight cane Transfers: Sit to/from Stand Sit to Stand:  Supervision;Min assist         General transfer comment: Initially, pt supervision for safety. By end of session and pt was more fatigued, required min assist for balance support.   Ambulation/Gait Ambulation/Gait assistance: Min guard;Min assist Gait Distance (Feet): 150 Feet Assistive device: Straight cane Gait Pattern/deviations: Step-through pattern;Decreased stride length;Trunk flexed Gait velocity: Decreased Gait velocity interpretation: <1.8 ft/sec, indicate of risk for recurrent falls General Gait Details: Slow and unsteady initially. Pt required SPC as well as HHA for safe and steady gait. Pt SOB upon return to room however appeared to recover quickly (<1 minute)   Stairs             Wheelchair Mobility    Modified Rankin (Stroke Patients Only)       Balance Overall balance assessment: Needs assistance Sitting-balance support: Feet supported;No upper extremity supported Sitting balance-Leahy Scale: Fair Sitting balance - Comments: Occasional posterior lean but able to correct himself without assistance.  Postural control: Posterior lean Standing balance support: No upper extremity supported Standing balance-Leahy Scale: Poor Standing balance comment: Reliant on UE support                            Cognition Arousal/Alertness: Awake/alert Behavior During Therapy: WFL for tasks assessed/performed Overall Cognitive Status: Difficult to assess  Exercises      General Comments        Pertinent Vitals/Pain Pain Assessment: No/denies pain    Home Living Family/patient expects to be discharged to:: Private residence Living Arrangements: Children Available Help at Discharge: Family;Available 24 hours/day Type of Home: House Home Access: Level entry   Home Layout: One level Home Equipment: Cane - single point      Prior Function Level of Independence: Independent with assistive  device(s)      Comments: Used SPC consistently. Pt's nephew present and states that pt helps to take care of his son (in his 84's). Pt drives and cooks at home.    PT Goals (current goals can now be found in the care plan section) Acute Rehab PT Goals Patient Stated Goal: Pt eager for d/c and to put his street clothes on. PT Goal Formulation: With patient Time For Goal Achievement: 01/09/19 Potential to Achieve Goals: Good    Frequency    Min 3X/week      PT Plan      Co-evaluation              AM-PAC PT "6 Clicks" Mobility   Outcome Measure  Help needed turning from your back to your side while in a flat bed without using bedrails?: None Help needed moving from lying on your back to sitting on the side of a flat bed without using bedrails?: None Help needed moving to and from a bed to a chair (including a wheelchair)?: A Little Help needed standing up from a chair using your arms (e.g., wheelchair or bedside chair)?: A Little Help needed to walk in hospital room?: A Little Help needed climbing 3-5 steps with a railing? : A Lot 6 Click Score: 19    End of Session Equipment Utilized During Treatment: Gait belt Activity Tolerance: Patient tolerated treatment well Patient left: in chair;with call bell/phone within reach;with chair alarm set Nurse Communication: Mobility status PT Visit Diagnosis: Unsteadiness on feet (R26.81);Muscle weakness (generalized) (M62.81);Difficulty in walking, not elsewhere classified (R26.2)     Time: 0160-1093 PT Time Calculation (min) (ACUTE ONLY): 31 min  Charges:  $Gait Training: 8-22 mins                     [name], PT, DPT Acute Rehabilitation Services Pager: [phone] Office: [phone]    [name] 01/02/2019, 1:32 PM

## 2019-01-02 NOTE — Progress Notes (Signed)
Hypoglycemic Event  CBG: 64  Treatment: 4 oz juice/soda  Symptoms: None  Follow-up CBG: [id number] CBG Result:141  Possible Reasons for Event: Unknown  Comments/MD notified:Per hypoglycemia protocol    Krystina Strieter Joselita

## 2019-01-03 ENCOUNTER — Other Ambulatory Visit: Payer: Self-pay

## 2019-01-03 ENCOUNTER — Encounter: Payer: Self-pay | Admitting: Family Medicine

## 2019-01-03 ENCOUNTER — Ambulatory Visit (INDEPENDENT_AMBULATORY_CARE_PROVIDER_SITE_OTHER): Payer: Medicare HMO | Admitting: Family Medicine

## 2019-01-03 VITALS — BP 122/70 | HR 79 | Temp 98.5°F | Ht 68.0 in | Wt 223.4 lb

## 2019-01-03 DIAGNOSIS — I2510 Atherosclerotic heart disease of native coronary artery without angina pectoris: Secondary | ICD-10-CM

## 2019-01-03 DIAGNOSIS — L03213 Periorbital cellulitis: Secondary | ICD-10-CM

## 2019-01-03 DIAGNOSIS — N179 Acute kidney failure, unspecified: Secondary | ICD-10-CM | POA: Diagnosis not present

## 2019-01-03 DIAGNOSIS — Z872 Personal history of diseases of the skin and subcutaneous tissue: Secondary | ICD-10-CM | POA: Diagnosis not present

## 2019-01-03 DIAGNOSIS — E1159 Type 2 diabetes mellitus with other circulatory complications: Secondary | ICD-10-CM

## 2019-01-03 DIAGNOSIS — R040 Epistaxis: Secondary | ICD-10-CM

## 2019-01-03 DIAGNOSIS — C61 Malignant neoplasm of prostate: Secondary | ICD-10-CM | POA: Diagnosis not present

## 2019-01-03 DIAGNOSIS — Z794 Long term (current) use of insulin: Secondary | ICD-10-CM

## 2019-01-03 DIAGNOSIS — D649 Anemia, unspecified: Secondary | ICD-10-CM | POA: Diagnosis not present

## 2019-01-03 DIAGNOSIS — I10 Essential (primary) hypertension: Secondary | ICD-10-CM

## 2019-01-03 LAB — CBC
HCT: 29.2 % — ABNORMAL LOW (ref 39.0–52.0)
Hemoglobin: 9.7 g/dL — ABNORMAL LOW (ref 13.0–17.0)
MCHC: 33.1 g/dL (ref 30.0–36.0)
MCV: 93.2 fl (ref 78.0–100.0)
Platelets: 326 10*3/uL (ref 150.0–400.0)
RBC: 3.13 Mil/uL — ABNORMAL LOW (ref 4.22–5.81)
RDW: 15.2 % (ref 11.5–15.5)
WBC: 9.1 10*3/uL (ref 4.0–10.5)

## 2019-01-03 LAB — COMPREHENSIVE METABOLIC PANEL
ALT: 13 U/L (ref 0–53)
AST: 17 U/L (ref 0–37)
Albumin: 3.6 g/dL (ref 3.5–5.2)
Alkaline Phosphatase: 50 U/L (ref 39–117)
BUN: 28 mg/dL — ABNORMAL HIGH (ref 6–23)
CO2: 26 mEq/L (ref 19–32)
Calcium: 9 mg/dL (ref 8.4–10.5)
Chloride: 104 mEq/L (ref 96–112)
Creatinine, Ser: 1.26 mg/dL (ref 0.40–1.50)
GFR: 64.19 mL/min (ref >60.00)
Glucose, Bld: 191 mg/dL — ABNORMAL HIGH (ref 70–99)
Potassium: 4.6 mEq/L (ref 3.5–5.1)
Sodium: 137 mEq/L (ref 135–145)
Total Bilirubin: 0.4 mg/dL (ref 0.2–1.2)
Total Protein: 6.3 g/dL (ref 6.0–8.3)

## 2019-01-03 LAB — BASIC METABOLIC PANEL
BUN: 28 mg/dL — ABNORMAL HIGH (ref 6–23)
CO2: 26 mEq/L (ref 19–32)
Calcium: 9 mg/dL (ref 8.4–10.5)
Chloride: 104 mEq/L (ref 96–112)
Creatinine, Ser: 1.26 mg/dL (ref 0.40–1.50)
GFR: 64.19 mL/min (ref >60.00)
Glucose, Bld: 191 mg/dL — ABNORMAL HIGH (ref 70–99)
Potassium: 4.6 mEq/L (ref 3.5–5.1)
Sodium: 137 mEq/L (ref 135–145)

## 2019-01-03 NOTE — Patient Instructions (Addendum)
Stop by lab before you go  Continue to hold hydrochlorothiazide and benazepril for blood pressure  Stay off those for now- I hate to do this but I want to see you back in a week to check on blood pressure and see if we can restart those and decide on aspirin at that time if anemia is better

## 2019-01-03 NOTE — Progress Notes (Signed)
Phone [phone]   Subjective:  [name] is a 85 y.o. year old very pleasant male patient who presents for transitional care management and hospital follow up for epistasis/anemia/weakness. Patient was hospitalized from December 31, 2018 to January 02, 2019. A TCM phone call was completed on January 04, 2019-though we were able to get patient scheduled for a day after release on the 12th. Medical complexity high.  I reviewed hospital discharge summary and summarized below.  Patient was hospitalized from December 31, 2018 through January 02, 2019 20-1 to the hospital with generalized weakness and was found to have nosebleeds and bloody emesis.  Hemoglobin dropped as low 7.4 down from baseline of 10.6.  He received 2 units of packed red blood cells.  Aspirin was held.  GI was consulted-hospitalist was concerned with elevated BUN that this could be a GI bleed-GI thought this was more related to dehydration.  They thought they Hemoccult positive was related to epistasis hemoglobin stabilized around 8.5  Holding aspirin is high risk for patient given history of coronary artery disease.  Alternatively-remaining on aspirin would place him at increased bleeding risk.  It was suggested that patient be on proton pump inhibitor if he was restarted on aspirin-though upper GI bleed was ultimately not thought to be the cause of anemia.  He was placed on pantoprazole 40 mg  Patient did have acute kidney injury-with creatinine up to 1.5 on admission.  His ACE inhibitor was held.  He received IV fluids and symptoms improved.  His blood pressure is controlled today without ACE inhibitor-he has not restarted this yet as instructed.  For his coronary artery disease-he was continued on lovastatin 40 mg while aspirin was held.  Diabetes-had one low blood sugar during hospitalization.  The hospitalist decreased his NPH/R to 12 units in the morning and 6 units in the evening.  Patient was also treated for periorbital  cellulitis-he was started on Augmentin and symptoms improved.  He was discharged with 5 additional days of Augmentin.  Urinary retention/prostate cancer-patient is in and outs at home.  He was continued on Flomax.  Ultrasound showed enlarged prostate gland but that is a known entity.  They recommended urology follow-up but patient has declined that previously.  He prefers just to continue to self cath.     See problem oriented charting as well ROS- redness around his left eye has resolved with antibiotic.  Still feels fatigued but improving some hard of hearing.  No chest pain or shortness of breath.   Past Medical History-  Patient Active Problem List   Diagnosis Date Noted  . Epistaxis, recurrent 12/31/2018    Priority: High  . Diastolic dysfunction 09/81/1914    Priority: High  . Prostate cancer ([place]) 08/21/2008    Priority: High  . Diabetes mellitus type II, controlled ([place]) 06/04/2007    Priority: High  . CAD (coronary artery disease) 06/04/2007    Priority: High  . Insomnia 07/06/2017    Priority: Medium  . Hyperlipidemia associated with type 2 diabetes mellitus ([place]) 06/04/2007    Priority: Medium  . Hypertension associated with diabetes ([place]) 06/04/2007    Priority: Medium  . Constipation 06/16/2010    Priority: Low  . GERD 08/04/2009    Priority: Low  . Umbilical hernia 78/29/5621    Priority: Low  . Hiatal hernia 02/13/2008    Priority: Low  . DEGENERATIVE DISC DISEASE, CERVICAL SPINE 02/13/2008    Priority: Low  . Hematemesis 01/01/2019  . Cellulitis of left  eyelid 01/01/2019  . Acute kidney injury ([place]) 01/01/2019  . Heme positive stool     Medications- reviewed and updated  A medical reconciliation was performed comparing current medicines to hospital discharge medications. Current Outpatient Medications  Medication Sig Dispense Refill  . ACCU-CHEK AVIVA PLUS test strip TEST BLOOD SUGAR TWICE DAILY 200 each 3  . ACCU-CHEK SOFTCLIX LANCETS lancets USE  TO   TEST TWICE DAILY 200 each 3  . Alcohol Swabs (B-D SINGLE USE SWABS REGULAR) PADS     . amoxicillin-clavulanate (AUGMENTIN) 875-125 MG tablet Take 1 tablet by mouth every 12 (twelve) hours for 5 days. 10 tablet 0  . ferrous sulfate 325 (65 FE) MG tablet Take 1 tablet (325 mg total) by mouth 2 (two) times daily with a meal. 60 tablet 3  . insulin NPH-regular Human (NOVOLIN 70/30) (70-30) 100 UNIT/ML injection Inject 12 units into the skin each morning and 6 units into the skin each evening. 10 mL 5  . Insulin Pen Needle (BD PEN NEEDLE NANO U/F) 32G X 4 MM MISC USE TWICE DAILY. DX: E11.9 90 each 11  . lovastatin (MEVACOR) 40 MG tablet Take 1 tablet (40 mg total) by mouth at bedtime. 90 tablet 0  . metFORMIN (GLUCOPHAGE) 500 MG tablet TAKE 1 TABLET TWICE DAILY WITH A MEAL (Patient taking differently: Take 500 mg by mouth 2 (two) times daily with a meal. ) 180 tablet 3  . pantoprazole (PROTONIX) 40 MG tablet Take 1 tablet (40 mg total) by mouth daily at 6 (six) AM. 30 tablet 0  . tamsulosin (FLOMAX) 0.4 MG CAPS capsule Take 1 capsule (0.4 mg total) by mouth daily. 30 capsule 0  . vitamin B-12 (CYANOCOBALAMIN) 100 MCG tablet Take 50 mcg by mouth daily.      . vitamin C (ASCORBIC ACID) 500 MG tablet Take 500 mg by mouth daily.       No current facility-administered medications for this visit.    Objective  Objective:  BP 122/70 (BP Location: Left Arm, Patient Position: Sitting, Cuff Size: Large)   Pulse 79   Temp 98.5 F (36.9 C) (Oral)   Wt 223 lb 6.4 oz (101.3 kg)   SpO2 98%   BMI 33.97 kg/m  Gen: NAD, resting comfortably-does seem to move somewhat slower today.  Supported by niece at visit CV: RRR no murmurs rubs or gallops Lungs: CTAB no crackles, wheeze, rhonchi Abdomen: soft/nontender/nondistended/normal bowel sounds. No rebound or guarding.  Ext: 1+ edema Skin: warm, dry Neuro: Hard of hearing per baseline    Assessment and Plan:   #Anemia/epistaxis-fortunately anemia has improved  further to 9.7 up from 8.5 on discharge.  I still want to hold aspirin for a few additional days and recheck hemoglobin next week before restarting the aspirin.  Once again GI was consulted and despite heme positive stools-they did not think this was a GI bleed but instead significant epistaxis  #Periorbital cellulitis-resolved on Augmentin-no evidence of this today.  Patient is going to complete course  #Hypertension-ACE inhibitor was held due to acute kidney injury.  Blood pressure is controlled today without ACE inhibitor.  Creatinine has improved some to 1.26.  We wanted to recheck blood pressure next week before restarting ACE inhibitor.  He is also off hydrochlorothiazide-depending on blood pressure at follow-up may only reintroduce benazepril to start.  #Coronary artery disease- remain off aspirin until next week.  Hopeful to restart aspirin at that time.  Hopefully GI bleeding risk will be reduced by being on proton  pump inhibitor-Protonix 40 mg though this will not help his epistaxis was the actual cause.  Patient remains chest pain free -He has not seen cardiology in years per his preference-we will reoffer next visit with CABG in 2009  #Diabetes- well-controlled during hospitalization-had reduction of NPH/R to 12 units in the morning and 6 units in the evening.  Patient remains on metformin as well-we will repeat A1c in 3 to 4 months likely. Lab Results  Component Value Date   HGBA1C 6.8 (H) 01/02/2019    #Acute kidney injury appears to have resolved with creatinine down to 1.26-down further from 1.5 on last check during hospitalization   #Deafness- due to patient being deaf despite cochlear implant- we communicate by me typing out in large print my questions  #Prostate cancer-patient continues without treatment since 2008 other than in and out caths.  He receives his supplies through [facility] I believe.  Future Appointments  Date Time Provider [place]  01/10/2019 11:20 AM  [name], MD [facility]  01/14/2019 10:40 AM [name], MD [facility]  We will likely cancel his follow-up on March 23  Lab/Order associations: Hypertension associated with diabetes ([place]) - Plan: CBC, Basic metabolic panel, Comprehensive metabolic panel  Epistaxis, recurrent  Normocytic anemia  Coronary artery disease involving native coronary artery of native heart without angina pectoris  Controlled type 2 diabetes mellitus with other circulatory complication, with long-term current use of insulin ([place])  Prostate cancer ([place])   Return precautions advised.  [name], MD

## 2019-01-04 ENCOUNTER — Telehealth: Payer: Self-pay

## 2019-01-04 NOTE — Telephone Encounter (Signed)
This encounter was created in error - please disregard.

## 2019-01-04 NOTE — Telephone Encounter (Signed)
Per Chart Review  Admit date: 12/31/2018 Discharge date: 01/02/2019  Admitted From: Home Disposition: Home  Recommendations for Outpatient Follow-up:  1. Follow up with PCP in 1-2 weeks 2. Please obtain BMP/CBC in one week 3. Following resolution of periorbital cellulitis. 4. Consider resumption of aspirin if hemoglobin is stable and no further nosebleeds. 5. Needs follow up with urology for prostate gland enl;argement.     Discharge Condition: Stable CODE STATUS: Full code Diet recommendation: Heart Healthy __________________________________  Per Telephone Call Transition Care Management Follow-up Telephone Call   Date discharged? 01/02/2019   How have you been since you were released from the hospital? Patient stated he is doing well other than he is weak.   Do you understand why you were in the hospital? yes   Do you understand the discharge instructions? yes   Where were you discharged to? Home   Items Reviewed:  Medications reviewed: yes  Allergies reviewed: yes  Dietary changes reviewed: yes  Referrals reviewed: yes   Functional Questionnaire:   Activities of Daily Living (ADLs):   He states they are independent in the following: ambulation, bathing and hygiene, feeding, continence, grooming, toileting and dressing States they require assistance with the following: None   Any transportation issues/concerns?: no   Any patient concerns? no   Confirmed importance and date/time of follow-up visits scheduled yes Provider Appointment booked with Dr. Yong Channel 01/03/2019, he was seen yesterday with provider. Confirmed with patient if condition begins to worsen call PCP or go to the ER.  Patient was given the office number and encouraged to call back with question or concerns.  : yes

## 2019-01-04 NOTE — Telephone Encounter (Deleted)
Per Chart Review  Admit date: 12/31/2018 Discharge date: 01/02/2019  Admitted From: Home Disposition: Home  Recommendations for Outpatient Follow-up:  1. Follow up with PCP in 1-2 weeks 2. Please obtain BMP/CBC in one week 3. Following resolution of periorbital cellulitis. 4. Consider resumption of aspirin if hemoglobin is stable and no further nosebleeds. 5. Needs follow up with urology for prostate gland enl;argement.     Discharge Condition: Stable CODE STATUS: Full code Diet recommendation: Heart Healthy

## 2019-01-04 NOTE — Telephone Encounter (Signed)
Noted thanks °

## 2019-01-04 NOTE — Telephone Encounter (Signed)
This encounter was created in error - please disregard.  This encounter was created in error - please disregard.

## 2019-01-05 NOTE — Assessment & Plan Note (Addendum)
#  Coronary artery disease- remain off aspirin until next week.  Hopeful to restart aspirin at that time.  Hopefully GI bleeding risk will be reduced by being on proton pump inhibitor-Protonix 40 mg though this will not help his epistaxis was the actual cause.  Patient remains chest pain free -He has not seen cardiology in years per his preference-we will reoffer next visit with CABG in 2009

## 2019-01-05 NOTE — Assessment & Plan Note (Signed)
#  Diabetes- well-controlled during hospitalization-had reduction of NPH/R to 12 units in the morning and 6 units in the evening.  Patient remains on metformin as well-we will repeat A1c in 3 to 4 months likely. Lab Results  Component Value Date   HGBA1C 6.8 (H) 01/02/2019

## 2019-01-05 NOTE — Assessment & Plan Note (Signed)
#  Prostate cancer-patient continues without treatment since 2008 other than in and out caths.  He receives his supplies through [facility] I believe.

## 2019-01-05 NOTE — Assessment & Plan Note (Addendum)
#  Anemia/epistaxis-fortunately anemia has improved further to 9.7 up from 8.5 on discharge.  I still want to hold aspirin for a few additional days and recheck hemoglobin next week before restarting the aspirin.  Once again GI was consulted and despite heme positive stools-they did not think this was a GI bleed but instead significant epistaxis

## 2019-01-05 NOTE — Assessment & Plan Note (Signed)
#  Hypertension-ACE inhibitor was held due to acute kidney injury.  Blood pressure is controlled today without ACE inhibitor.  Creatinine has improved some to 1.26.  We wanted to recheck blood pressure next week before restarting ACE inhibitor.  He is also off hydrochlorothiazide-depending on blood pressure at follow-up may only reintroduce benazepril to start.

## 2019-01-07 ENCOUNTER — Ambulatory Visit: Payer: Self-pay

## 2019-01-07 NOTE — Telephone Encounter (Signed)
Pt. was triaged at 2:42 PM, today; documentation was  delayed.  Pt's niece called on his behalf.  Reported at approx. 2:20 PM today, the pt. had to clear throat, and spit out a phlegm with bright blood mixed in.  Reported the episode occurred x 1.  Unable to estimate the amt.  Stated the pt. Put kleenex in his nostrils, to check for nose bleed.  Niece stated when the kleenex was removed by pt., it appeared slightly discolored, but did not have bright blood on it.  Stated there was no further signs of active nose bleed.  Stated the pt. Denied feeling light-headed or dizzy.  Denied picking at his nostrils or blowing his nose.  Stated he has continued to hold his ASA, as was directed.  Pt. has appt. on 3/19 to f/u with Dr. Yong Channel.  Rescheduled the f/u appt. to 01/08/19 @ 2:20 PM.  Advised to go to ER if recurrence of active nose bleed that doesn't subside, increased weakness, or dizziness.  Niece verb. understanding.  Agrees with plan.

## 2019-01-07 NOTE — Telephone Encounter (Signed)
  Reason for Disposition . [1] Mild-moderate nosebleed AND [2] bleeding stopped now  Answer Assessment - Initial Assessment Questions 1. AMOUNT OF BLEEDING: "How bad is the bleeding?" "How much blood was lost?" "Has the bleeding stopped?"   - MILD: needed a couple tissues   - MODERATE: needed many tissues   - SEVERE: large blood clots, soaked many tissues, lasted more than 30 minutes      mild 2. ONSET: "When did the nosebleed start?"      Had some bloody drainage in throat today x 1   3. FREQUENCY: "How many nosebleeds have you had in the last 24 hours?"     One time occurance 4. RECURRENT SYMPTOMS: "Have there been other recent nosebleeds?" If so, ask: "How long did it take you to stop the bleeding?" "What worked best?"      Yes; recently in hospital for nose bleed 5. CAUSE: "What do you think caused this nosebleed?"     *No Answer* 6. LOCAL FACTORS: "Do you have any cold symptoms?", "Have you been rubbing or picking at your nose?"     No  7. SYSTEMIC FACTORS: "Do you have high blood pressure or any bleeding problems?"     Recent anemia, weakness and nose bleed that was hospitalized for 12/31/18 8. BLOOD THINNERS: "Do you take any blood thinners?" (e.g., coumadin, heparin, aspirin, Plavix)     Aspirin on hold 9. OTHER SYMPTOMS: "Do you have any other symptoms?" (e.g., lightheadedness)     Denied lightheadedness or dizziness, denied pain. Denied picking at the nose or blowing the nose  10. PREGNANCY: "Is there any chance you are pregnant?" "When was your last menstrual period?"       N/a  Protocols used: [id number]

## 2019-01-08 ENCOUNTER — Ambulatory Visit: Payer: Medicare HMO | Admitting: Family Medicine

## 2019-01-08 ENCOUNTER — Encounter: Payer: Self-pay | Admitting: Family Medicine

## 2019-01-08 ENCOUNTER — Ambulatory Visit (INDEPENDENT_AMBULATORY_CARE_PROVIDER_SITE_OTHER): Payer: Medicare HMO | Admitting: Family Medicine

## 2019-01-08 ENCOUNTER — Other Ambulatory Visit: Payer: Self-pay

## 2019-01-08 VITALS — BP 130/60 | HR 79 | Temp 98.2°F | Ht 68.0 in | Wt 221.5 lb

## 2019-01-08 DIAGNOSIS — E1169 Type 2 diabetes mellitus with other specified complication: Secondary | ICD-10-CM | POA: Diagnosis not present

## 2019-01-08 DIAGNOSIS — E785 Hyperlipidemia, unspecified: Secondary | ICD-10-CM | POA: Diagnosis not present

## 2019-01-08 DIAGNOSIS — E1159 Type 2 diabetes mellitus with other circulatory complications: Secondary | ICD-10-CM | POA: Diagnosis not present

## 2019-01-08 DIAGNOSIS — I10 Essential (primary) hypertension: Secondary | ICD-10-CM

## 2019-01-08 DIAGNOSIS — D500 Iron deficiency anemia secondary to blood loss (chronic): Secondary | ICD-10-CM

## 2019-01-08 DIAGNOSIS — R040 Epistaxis: Secondary | ICD-10-CM

## 2019-01-08 LAB — CBC
HCT: 30.1 % — ABNORMAL LOW (ref 39.0–52.0)
Hemoglobin: 10.1 g/dL — ABNORMAL LOW (ref 13.0–17.0)
MCHC: 33.6 g/dL (ref 30.0–36.0)
MCV: 93.6 fl (ref 78.0–100.0)
Platelets: 479 10*3/uL — ABNORMAL HIGH (ref 150.0–400.0)
RBC: 3.22 Mil/uL — ABNORMAL LOW (ref 4.22–5.81)
RDW: 15.3 % (ref 11.5–15.5)
WBC: 8.6 10*3/uL (ref 4.0–10.5)

## 2019-01-08 MED ORDER — BENAZEPRIL HCL 40 MG PO TABS: 40.0000 mg | ORAL_TABLET | Freq: Every day | ORAL | 3 refills | Status: DC

## 2019-01-08 NOTE — Patient Instructions (Addendum)
I want to hold off on your aspirin until you have not had a nosebleed for at least a full week. Make a calendar so we can track that and then you can restart. Let me know if you have any more nosebleeds (may need to refer you to ear nose and throat doctors).   Restart lovastatin  Let's restart benazepril 40mg  tablet. Hold off on the hydrochlorothiazide for now. Let me know in a week how your blood pressures are doing- have your niece give [place] a call   stop by lab  We will try to work things out by phone for now if we can- give me an update in a week with blood pressure and if you have been able to restart aspirin

## 2019-01-08 NOTE — Progress Notes (Signed)
Phone [phone]   Subjective:  [name] is a 85 y.o. year old very pleasant male patient who presents for/with See problem oriented charting ROS-slight nosebleed yesterday's, still some fatigue.  Stable edema.  No chest pain or shortness of breath reported.  Past Medical History-  Patient Active Problem List   Diagnosis Date Noted  . Epistaxis, recurrent 12/31/2018    Priority: High  . Diastolic dysfunction 10/26/7251    Priority: High  . Prostate cancer ([place]) 08/21/2008    Priority: High  . Diabetes mellitus type II, controlled ([place]) 06/04/2007    Priority: High  . CAD (coronary artery disease) 06/04/2007    Priority: High  . Insomnia 07/06/2017    Priority: Medium  . Hyperlipidemia associated with type 2 diabetes mellitus ([place]) 06/04/2007    Priority: Medium  . Hypertension associated with diabetes ([place]) 06/04/2007    Priority: Medium  . Constipation 06/16/2010    Priority: Low  . GERD 08/04/2009    Priority: Low  . Umbilical hernia 66/44/0347    Priority: Low  . Hiatal hernia 02/13/2008    Priority: Low  . DEGENERATIVE DISC DISEASE, CERVICAL SPINE 02/13/2008    Priority: Low  . Hematemesis 01/01/2019  . Cellulitis of left eyelid 01/01/2019  . Acute kidney injury ([place]) 01/01/2019  . Heme positive stool     Medications- reviewed and updated Current Outpatient Medications  Medication Sig Dispense Refill  . ACCU-CHEK AVIVA PLUS test strip TEST BLOOD SUGAR TWICE DAILY 200 each 3  . ACCU-CHEK SOFTCLIX LANCETS lancets USE  TO  TEST TWICE DAILY 200 each 3  . Alcohol Swabs (B-D SINGLE USE SWABS REGULAR) PADS     . ferrous sulfate 325 (65 FE) MG tablet Take 1 tablet (325 mg total) by mouth 2 (two) times daily with a meal. 60 tablet 3  . insulin NPH-regular Human (NOVOLIN 70/30) (70-30) 100 UNIT/ML injection Inject 12 units into the skin each morning and 6 units into the skin each evening. 10 mL 5  . Insulin Pen Needle (BD PEN NEEDLE NANO U/F) 32G X 4 MM MISC  USE TWICE DAILY. DX: E11.9 90 each 11  . lovastatin (MEVACOR) 40 MG tablet Take 1 tablet (40 mg total) by mouth at bedtime. 90 tablet 0  . metFORMIN (GLUCOPHAGE) 500 MG tablet TAKE 1 TABLET TWICE DAILY WITH A MEAL (Patient taking differently: Take 500 mg by mouth 2 (two) times daily with a meal. ) 180 tablet 3  . pantoprazole (PROTONIX) 40 MG tablet Take 1 tablet (40 mg total) by mouth daily at 6 (six) AM. 30 tablet 0  . tamsulosin (FLOMAX) 0.4 MG CAPS capsule Take 1 capsule (0.4 mg total) by mouth daily. 30 capsule 0  . vitamin B-12 (CYANOCOBALAMIN) 100 MCG tablet Take 50 mcg by mouth daily.      . vitamin C (ASCORBIC ACID) 500 MG tablet Take 500 mg by mouth daily.      . benazepril (LOTENSIN) 40 MG tablet Take 1 tablet (40 mg total) by mouth daily. 90 tablet 3   No current facility-administered medications for this visit.      Objective:  BP 130/60 (BP Location: Left Arm, Patient Position: Sitting, Cuff Size: Normal)   Pulse 79   Temp 98.2 F (36.8 C) (Oral)   Ht 5\' 8"  (1.727 m)   Wt 221 lb 8 oz (100.5 kg)   SpO2 98%   BMI 33.68 kg/m  Gen: NAD, resting comfortably Slightly dry mucous membranes-no obvious source of bleeding CV:  RRR no murmurs rubs or gallops Lungs: CTAB no crackles, wheeze, rhonchi Abdomen: soft/nontender/nondistended/normal bowel sounds.  Ext: 1+ edema Skin: warm, dry Neuro: Deaf/hard of hearing    Assessment and Plan    #Epistaxis/anemia S: Patient was seen last week for hospital follow-up after epistaxis led to significant anemia and subsequent hospitalization.  He noted a slight nosebleed yesterday-this seemed to resolve quickly.  He has remained off his aspirin for now in hopes to allow nasal passages to heal.  We confirmed today that he is not on nasal steroids like Flonase .  A/P:From AVS "  I want to hold off on your aspirin until you have not had a nosebleed for at least a full week. Make a calendar so we can track that and then you can restart. Let me  know if you have any more nosebleeds (may need to refer you to ear nose and throat doctors). "  -Really want to restart aspirin with CAD history  #hypertension S: controlled on no medication-off hydrochlorothiazide and benazepril BP Readings from Last 3 Encounters:  01/08/19 130/60  01/03/19 122/70  01/02/19 129/66  A/P:  Stable in office but some poor control at home off all medication From AVS:  "Let's restart benazepril 40mg  tablet. Hold off on the hydrochlorothiazide for now. Let me know in a week how your blood pressures are doing- have your niece give [place] a call  ""  #hyperlipidemia S: mild poorly controlled on last check- for some reason has stopped lovastatin Lab Results  Component Value Date   CHOL 121 08/13/2018   HDL 42.10 08/13/2018   LDLCALC 46 08/13/2018   LDLDIRECT 72.0 03/23/2017   TRIG 163.0 (H) 08/13/2018   CHOLHDL 3 08/13/2018   A/P: Suspect mild poor control of lovastatin- encouraged him to restart this immediately.  Lab/Order associations: Epistaxis - Plan: CBC  Anemia, blood loss - Plan: CBC  Hypertension associated with diabetes ([place])  Hyperlipidemia associated with type 2 diabetes mellitus ([place])  Meds ordered this encounter  Medications  . benazepril (LOTENSIN) 40 MG tablet    Sig: Take 1 tablet (40 mg total) by mouth daily.    Dispense:  90 tablet    Refill:  3    Return precautions advised.  [name], MD

## 2019-01-09 ENCOUNTER — Telehealth: Payer: Self-pay | Admitting: Family Medicine

## 2019-01-09 ENCOUNTER — Encounter: Payer: Self-pay | Admitting: Family Medicine

## 2019-01-09 NOTE — Assessment & Plan Note (Signed)
S: mild poorly controlled on last check- for some reason has stopped lovastatin Lab Results  Component Value Date   CHOL 121 08/13/2018   HDL 42.10 08/13/2018   LDLCALC 46 08/13/2018   LDLDIRECT 72.0 03/23/2017   TRIG 163.0 (H) 08/13/2018   CHOLHDL 3 08/13/2018   A/P: Suspect mild poor control of lovastatin- encouraged him to restart this immediately.

## 2019-01-09 NOTE — Telephone Encounter (Signed)
Pt given lab results per notes of Dr Yong Channel on 01/08/2019. Pt verbalized understanding.

## 2019-01-09 NOTE — Assessment & Plan Note (Signed)
S: controlled on no medication-off hydrochlorothiazide and benazepril BP Readings from Last 3 Encounters:  01/08/19 130/60  01/03/19 122/70  01/02/19 129/66  A/P:  Stable in office but some poor control at home off all medication From AVS:  "Let's restart benazepril 40mg  tablet. Hold off on the hydrochlorothiazide for now. Let me know in a week how your blood pressures are doing- have your niece give [place] a call  ""

## 2019-01-09 NOTE — Telephone Encounter (Signed)
See note

## 2019-01-09 NOTE — Telephone Encounter (Signed)
Noted  

## 2019-01-10 ENCOUNTER — Ambulatory Visit: Payer: Medicare HMO | Admitting: Family Medicine

## 2019-01-14 ENCOUNTER — Ambulatory Visit: Payer: Medicare HMO | Admitting: Family Medicine

## 2019-01-22 ENCOUNTER — Ambulatory Visit: Payer: Self-pay | Admitting: Family Medicine

## 2019-01-22 NOTE — Telephone Encounter (Signed)
Lets have him cut that in half to 20mg  and monitor blood pressure over next week and let us know what the #s are and if he feels better.

## 2019-01-22 NOTE — Telephone Encounter (Signed)
Pt called in saying his BP is running low.   He stopped taking his BP medication yesterday because of his low BP.   See below for readings  I let him know someone from the office will call him back due to the coronavirus pandemic going on now.    I verified his phone #.   He was agreeable to this plan.  I sent these notes to Dr. Ansel Bong office.    Reason for Disposition . [8] Systolic BP < 90 AND [3] NOT dizzy, lightheaded or weak  Answer Assessment - Initial Assessment Questions 1. BLOOD PRESSURE: "What is the blood pressure?" "Did you take at least two measurements 5 minutes apart?"     I'm doing my BP 4 different times 106/50 - 110/50 this morning. 2. ONSET: "When did you take your blood pressure?"     This morning.   I'm usually 130/70. 3. HOW: "How did you obtain the blood pressure?" (e.g., visiting nurse, automatic home BP monitor)     Bp machine like in the office. 4. HISTORY: "Do you have a history of low blood pressure?" "What is your blood pressure normally?"     No.  See above. 5. MEDICATIONS: "Are you taking any medications for blood pressure?" If yes: "Have they been changed recently?"     I stopped taking it because my BP is so low yesterday. 6. PULSE RATE: "Do you know what your pulse rate is?"      Not asked 7. OTHER SYMPTOMS: "Have you been sick recently?" "Have you had a recent injury?"     No. 8. PREGNANCY: "Is there any chance you are pregnant?" "When was your last menstrual period?"     N/A  Protocols used: LOW BLOOD PRESSURE-A-AH

## 2019-01-22 NOTE — Telephone Encounter (Signed)
Please advise.  Pt or [name] does not have a smart phoneSpoke to pt and he stated that his phone has been running low. 115/63 pulse 81. Pt stated he feels better about his Bp but his energy level seems low. Called [name] back to cancel the trip to pt house.

## 2019-01-22 NOTE — Telephone Encounter (Signed)
I was going to schedule a webex however,I spoke with B[place] to ensure a webex would be appropriate and the best option/if an option as the patient does not have an email. Please advise

## 2019-01-22 NOTE — Telephone Encounter (Signed)
Please verify for me- What current blood pressure medicines is he taking?

## 2019-01-22 NOTE — Telephone Encounter (Signed)
Pt did not answer. Called pt niece and she stated that when she was taking pt Bp the ranges were 120/130-70/80. She was informed that pt gave new readings today with lower numbers. Guerry Minors was advised that I called pt serveral times with no answer. Guerry Minors stated that pt is def and has been having issues with his phone that shows text about the nature of calls. I asked if Guerry Minors could go to the pt house to help answer questions so that we can better assess pt. Guerry Minors agreed and will call back when she is at pt house.

## 2019-01-30 ENCOUNTER — Other Ambulatory Visit: Payer: Self-pay

## 2019-01-30 MED ORDER — TAMSULOSIN HCL 0.4 MG PO CAPS: 0.4000 mg | ORAL_CAPSULE | Freq: Every day | ORAL | 0 refills | Status: DC

## 2019-01-30 MED ORDER — TAMSULOSIN HCL 0.4 MG PO CAPS: 0.4000 mg | ORAL_CAPSULE | Freq: Every day | ORAL | 0 refills | Status: AC

## 2019-01-30 MED ORDER — LOVASTATIN 40 MG PO TABS: 40.0000 mg | ORAL_TABLET | Freq: Every day | ORAL | 0 refills | Status: DC

## 2019-01-30 NOTE — Telephone Encounter (Signed)
Pharmacy request [employer] Lovastatin 40mg   #90/0 Tamsulosin 0.4mg  #30/0

## 2019-02-05 ENCOUNTER — Telehealth: Payer: Self-pay

## 2019-02-05 ENCOUNTER — Other Ambulatory Visit: Payer: Self-pay | Admitting: Family Medicine

## 2019-02-05 NOTE — Telephone Encounter (Signed)
Overall average on these blood pressures look great- continue current medications.

## 2019-02-05 NOTE — Telephone Encounter (Signed)
Pt mailed in his 7 day Bp readings.  4-1 130/72 78 4-2 124/66 77 4-3 120/68 84  4-4 130/68 77 4-5 133/70 73 4-6 136/73 71 4-7 148/78 77

## 2019-02-07 ENCOUNTER — Other Ambulatory Visit: Payer: Self-pay | Admitting: Family Medicine

## 2019-02-07 ENCOUNTER — Telehealth: Payer: Self-pay | Admitting: Family Medicine

## 2019-02-07 MED ORDER — LOVASTATIN 40 MG PO TABS: 40.0000 mg | ORAL_TABLET | Freq: Every day | ORAL | 0 refills | Status: DC

## 2019-02-07 NOTE — Telephone Encounter (Signed)
I called [name] twice with no answer or vm.  I called [name] to inform her of [name] message.   [name] verbalized understanding and said that she will relay message to [name].

## 2019-02-07 NOTE — Telephone Encounter (Signed)
See note, unable to attach to 4/16 8:47am note from [facility].   Copied from [place] [id number]. Topic: General - Other >> Feb 07, 2019  8:51 AM Virl Axe D wrote: Reason for CRM: Pt stated he received a call from the office but does not know why. Please reach out to patient if needed.

## 2019-02-07 NOTE — Telephone Encounter (Signed)
Requested Prescriptions  Pending Prescriptions Disp Refills  . lovastatin (MEVACOR) 40 MG tablet 90 tablet 0    Sig: Take 1 tablet (40 mg total) by mouth at bedtime.     Cardiovascular:  Antilipid - Statins Failed - 02/07/2019 10:35 AM      Failed - Triglycerides in normal range and within 360 days    Triglycerides  Date Value Ref Range Status  08/13/2018 163.0 (H) 0.0 - 149.0 mg/dL Final    Comment:    Normal:  <150 mg/dLBorderline High:  150 - 199 mg/dL         Passed - Total Cholesterol in normal range and within 360 days    Cholesterol  Date Value Ref Range Status  08/13/2018 121 0 - 200 mg/dL Final    Comment:    ATP III Classification       Desirable:  < 200 mg/dL               Borderline High:  200 - 239 mg/dL          High:  > = 240 mg/dL         Passed - LDL in normal range and within 360 days    LDL Cholesterol  Date Value Ref Range Status  08/13/2018 46 0 - 99 mg/dL Final         Passed - HDL in normal range and within 360 days    HDL  Date Value Ref Range Status  08/13/2018 42.10 >39.00 mg/dL Final         Passed - Patient is not pregnant      Passed - Valid encounter within last 12 months    Recent Outpatient Visits          1 month ago Epistaxis   [place] [facility], Brayton Mars, MD   1 month ago Epistaxis, recurrent   [place] [facility], Brayton Mars, MD   1 month ago Controlled type 2 diabetes mellitus with other circulatory complication, with long-term current use of insulin [facility])   [place] [facility], [place], [place]   5 months ago Preventative health care   [place] [facility], Brayton Mars, MD   10 months ago Controlled type 2 diabetes mellitus without complication, with long-term current use of insulin [facility])   [facility] [place], Brayton Mars, MD           noted Lovastatin refill on 01/30/19 was "printed"; no confirmation in the Rx that  pharmacy received; resent Rx electronically at this time.

## 2019-04-08 ENCOUNTER — Other Ambulatory Visit: Payer: Self-pay | Admitting: Family Medicine

## 2019-05-01 ENCOUNTER — Other Ambulatory Visit: Payer: Self-pay | Admitting: Family Medicine

## 2019-05-02 ENCOUNTER — Telehealth: Payer: Self-pay | Admitting: Cardiology

## 2019-05-02 NOTE — Telephone Encounter (Signed)
LVM for patient to schedule a followup with Dr. Harrell Gave.

## 2019-05-27 ENCOUNTER — Telehealth: Payer: Self-pay

## 2019-05-27 NOTE — Telephone Encounter (Signed)
Call received requesting problem list and current medications/allergies. Pt current in chair at dentists office.   Called niece, Ovid Curd (on [place]), and received verbal authorization to fax requested information.   Information has been faxed.

## 2019-05-28 NOTE — Telephone Encounter (Signed)
Forwarding to Dr. Yong Channel to advise regarding pre-meds.

## 2019-05-28 NOTE — Telephone Encounter (Signed)
Not sure what kind of premedication is being requested- patient is not on blood thinner so no blood thinner needs to be stopped.  No clear indication for antibiotics based on brief review of chart.  Should be able to proceed with extraction

## 2019-05-28 NOTE — Telephone Encounter (Signed)
[name], from Dr.Adornetto's office, is stating patient is now needing an extraction and they are wondering if he needs to have any pre medications before the extraction on Thursday. Please advise. States it is okay to either call or fax response. Fax is [phone]. Call back is [phone]. If fax is sent please attention to [name].

## 2019-05-28 NOTE — Telephone Encounter (Signed)
See note

## 2019-05-28 NOTE — Telephone Encounter (Signed)
Caller name: [place] Relation to pt:  Dr. Arlyss Queen Call back number: [phone]    Reason for call:  Requesting if he needs pre medication and will PCP clear for extraction please fax only [phone] , requesting a response by the end of the day or tomorrow morning, please advise

## 2019-05-29 NOTE — Telephone Encounter (Signed)
Providers office called back in to follow up on fax per conversation. Office says that they have to have fax for clearance by tomorrow morning due to pt being seen then.    Please assist.

## 2019-05-29 NOTE — Telephone Encounter (Signed)
Called to confirm reason for request, was advised d/t hx of bypass surgery. Faxed note staying no pre-meds required prior to tooth extraction.

## 2019-05-30 NOTE — Telephone Encounter (Signed)
Called [name] and she stated everything was needed for pt. She stated someone faxed PPW yesterday afternoon. No further action needed!

## 2019-06-07 ENCOUNTER — Telehealth: Payer: Self-pay | Admitting: *Deleted

## 2019-06-07 NOTE — Telephone Encounter (Signed)
Patient called to schedule an appointment with Dr. Yong Channel for generalized weakness.[name] He reports he has been feeling weak and "off" for about 2-3 weeks. He denies any HA, dizziness, cough, SOB, and chest pain/tightness. Pt scheduled for OV 8/17 with Dr. Yong Channel.

## 2019-06-12 ENCOUNTER — Encounter: Payer: Self-pay | Admitting: Family Medicine

## 2019-06-12 ENCOUNTER — Other Ambulatory Visit: Payer: Self-pay

## 2019-06-12 ENCOUNTER — Ambulatory Visit (INDEPENDENT_AMBULATORY_CARE_PROVIDER_SITE_OTHER): Payer: Medicare HMO | Admitting: Family Medicine

## 2019-06-12 VITALS — BP 122/72 | HR 77 | Temp 98.0°F | Ht 68.0 in | Wt 204.8 lb

## 2019-06-12 DIAGNOSIS — E1169 Type 2 diabetes mellitus with other specified complication: Secondary | ICD-10-CM

## 2019-06-12 DIAGNOSIS — R5383 Other fatigue: Secondary | ICD-10-CM | POA: Diagnosis not present

## 2019-06-12 DIAGNOSIS — C61 Malignant neoplasm of prostate: Secondary | ICD-10-CM | POA: Diagnosis not present

## 2019-06-12 DIAGNOSIS — E1159 Type 2 diabetes mellitus with other circulatory complications: Secondary | ICD-10-CM | POA: Diagnosis not present

## 2019-06-12 DIAGNOSIS — E785 Hyperlipidemia, unspecified: Secondary | ICD-10-CM

## 2019-06-12 DIAGNOSIS — I5189 Other ill-defined heart diseases: Secondary | ICD-10-CM

## 2019-06-12 DIAGNOSIS — Z23 Encounter for immunization: Secondary | ICD-10-CM

## 2019-06-12 DIAGNOSIS — Z794 Long term (current) use of insulin: Secondary | ICD-10-CM

## 2019-06-12 DIAGNOSIS — I2510 Atherosclerotic heart disease of native coronary artery without angina pectoris: Secondary | ICD-10-CM

## 2019-06-12 DIAGNOSIS — I10 Essential (primary) hypertension: Secondary | ICD-10-CM | POA: Diagnosis not present

## 2019-06-12 LAB — HEMOGLOBIN A1C: Hgb A1c MFr Bld: 7.4 % — ABNORMAL HIGH (ref 4.6–6.5)

## 2019-06-12 LAB — COMPREHENSIVE METABOLIC PANEL
ALT: 7 U/L (ref 0–53)
AST: 9 U/L (ref 0–37)
Albumin: 4 g/dL (ref 3.5–5.2)
Alkaline Phosphatase: 45 U/L (ref 39–117)
BUN: 27 mg/dL — ABNORMAL HIGH (ref 6–23)
CO2: 26 mEq/L (ref 19–32)
Calcium: 9.6 mg/dL (ref 8.4–10.5)
Chloride: 106 mEq/L (ref 96–112)
Creatinine, Ser: 1.42 mg/dL (ref 0.40–1.50)
GFR: 55.86 mL/min — ABNORMAL LOW (ref >60.00)
Glucose, Bld: 58 mg/dL — ABNORMAL LOW (ref 70–99)
Potassium: 3.8 mEq/L (ref 3.5–5.1)
Sodium: 141 mEq/L (ref 135–145)
Total Bilirubin: 0.4 mg/dL (ref 0.2–1.2)
Total Protein: 6.8 g/dL (ref 6.0–8.3)

## 2019-06-12 LAB — TSH: TSH: 1.98 u[IU]/mL (ref 0.35–4.50)

## 2019-06-12 LAB — CBC
HCT: 32.7 % — ABNORMAL LOW (ref 39.0–52.0)
Hemoglobin: 10.3 g/dL — ABNORMAL LOW (ref 13.0–17.0)
MCHC: 31.5 g/dL (ref 30.0–36.0)
MCV: 89.4 fl (ref 78.0–100.0)
Platelets: 415 10*3/uL — ABNORMAL HIGH (ref 150.0–400.0)
RBC: 3.65 Mil/uL — ABNORMAL LOW (ref 4.22–5.81)
RDW: 19.6 % — ABNORMAL HIGH (ref 11.5–15.5)
WBC: 13.7 10*3/uL — ABNORMAL HIGH (ref 4.0–10.5)

## 2019-06-12 LAB — PSA: PSA: 16.85 ng/mL — ABNORMAL HIGH (ref 0.10–4.00)

## 2019-06-12 NOTE — Assessment & Plan Note (Signed)
S: Well controlled on benazepril 40 mg.  Hydrochlorothiazide in the past but do not see on current medication list - we stopped in march 2020 and Bps remain controlled A/P:  Stable. Continue current medications.

## 2019-06-12 NOTE — Assessment & Plan Note (Signed)
S: Reasonably controlled in the past on NPH/R 20 units in the morning and 18 units in the evening along with metformin 500 mg twice a day in the past- now down to 12 units in AM and 6 units at night.  No hypoglycemia has been reported. 112 this AM A/P: Update A1c today- target A1c under 8.5.  I think not eating can be contributing ot fatigue though he denies having many actual lows- does feel more fatigued if doesn't eat so encouraged regular eating habits as above

## 2019-06-12 NOTE — Progress Notes (Signed)
Phone [phone]   Subjective:  [name] is a 85 y.o. year old very pleasant male patient who presents for/with See problem oriented charting Chief Complaint  Patient presents with   Fatigue   ROS- no chest pain, shortness of breath, headache, blurry vision. No trouble swallowing. Does have low appetite. No fever or chills.  Denies body aches/pains  Past Medical History-  Patient Active Problem List   Diagnosis Date Noted   Epistaxis, recurrent 12/31/2018    Priority: High   Diastolic dysfunction 44/31/5400    Priority: High   Prostate cancer ([place]) 08/21/2008    Priority: High   Diabetes mellitus type II, controlled ([place]) 06/04/2007    Priority: High   CAD (coronary artery disease) 06/04/2007    Priority: High   Insomnia 07/06/2017    Priority: Medium   Hyperlipidemia associated with type 2 diabetes mellitus ([place]) 06/04/2007    Priority: Medium   Hypertension associated with diabetes ([place]) 06/04/2007    Priority: Medium   Constipation 06/16/2010    Priority: Low   GERD 08/04/2009    Priority: Low   Umbilical hernia 86/76/1948    Priority: Low   Hiatal hernia 02/13/2008    Priority: Low   DEGENERATIVE DISC DISEASE, CERVICAL SPINE 02/13/2008    Priority: Low   Hematemesis 01/01/2019   Cellulitis of left eyelid 01/01/2019   Acute kidney injury ([place]) 01/01/2019   Heme positive stool     Medications- reviewed and updated Current Outpatient Medications  Medication Sig Dispense Refill   ACCU-CHEK AVIVA PLUS test strip TEST BLOOD SUGAR TWICE DAILY 200 each 3   ACCU-CHEK SOFTCLIX LANCETS lancets USE  TO  TEST TWICE DAILY 200 each 3   Alcohol Swabs (B-D SINGLE USE SWABS REGULAR) PADS      benazepril (LOTENSIN) 40 MG tablet TAKE 1 TABLET EVERY DAY 90 tablet 0   ferrous sulfate 325 (65 FE) MG tablet Take 1 tablet (325 mg total) by mouth 2 (two) times daily with a meal. 60 tablet 3   insulin NPH-regular Human (NOVOLIN 70/30) (70-30) 100  UNIT/ML injection Inject 12 units into the skin each morning and 6 units into the skin each evening. 10 mL 5   Insulin Pen Needle (BD PEN NEEDLE NANO U/F) 32G X 4 MM MISC USE TWICE DAILY. DX: E11.9 90 each 11   lovastatin (MEVACOR) 40 MG tablet Take 1 tablet (40 mg total) by mouth at bedtime. 90 tablet 0   metFORMIN (GLUCOPHAGE) 500 MG tablet TAKE 1 TABLET TWICE DAILY WITH A MEAL (Patient taking differently: Take 500 mg by mouth 2 (two) times daily with a meal. ) 180 tablet 3   pantoprazole (PROTONIX) 40 MG tablet Take 1 tablet (40 mg total) by mouth daily at 6 (six) AM. 30 tablet 0   tamsulosin (FLOMAX) 0.4 MG CAPS capsule Take 1 capsule (0.4 mg total) by mouth daily. 30 capsule 0   vitamin B-12 (CYANOCOBALAMIN) 100 MCG tablet Take 50 mcg by mouth daily.       vitamin C (ASCORBIC ACID) 500 MG tablet Take 500 mg by mouth daily.       No current facility-administered medications for this visit.      Objective:  BP 122/72 (BP Location: Left Arm, Patient Position: Sitting, Cuff Size: Normal)    Pulse 77    Temp 98 F (36.7 C) (Oral)    Ht 5\' 8"  (1.727 m)    Wt 204 lb 12.8 oz (92.9 kg)    SpO2 98%  BMI 31.14 kg/m  Gen: NAD, resting comfortably CV: RRR no murmurs rubs or gallops Lungs: CTAB no crackles, wheeze, rhonchi Abdomen: soft/nontender/nondistended/normal bowel sounds.  Ext: stable 1+ edema under compression stockings Skin: warm, dry Neuro: deaf    Assessment and Plan   # Generalized Weakness/Fatigue, unspecified type S:Sx x 2 months. Denies chest pain, SOB, HA, blood in the stool. No dietary changes but does report very low appetite. Feels like balance is not the best- using his cane. when we really got down to it (we have to type things out for him then he can respond verbally) seems patient is tired if he skips meals/doesnt eat- A/P: Fatigue sounds related to low appetite and not eating consistent meals (feels better when he eats)- encouraged 3x a day meals- if he is not  improving at 1-2 month follow up will have to see urology back- I still have some concern for malignancy with 17 lb weight loss and if doesn't stabilize investigate further (he declines urolgoy visit for now). Encouraged him to monitor for low cbgs when has these symptoms  Controlled type 2 diabetes mellitus with other circulatory complication, with long-term current use of insulin (HCC) S: Reasonably controlled in the past on NPH/R 20 units in the morning and 18 units in the evening along with metformin 500 mg twice a day in the past- now down to 12 units in AM and 6 units at night.  No hypoglycemia has been reported. 112 this AM A/P: Update A1c today- target A1c under 8.5.  I think not eating can be contributing ot fatigue though he denies having many actual lows- does feel more fatigued if doesn't eat so encouraged regular eating habits as above  Coronary artery disease involving native coronary artery of native heart without angina pectoris  S: Patient with known coronary artery disease with CABG 2009.  Cardiology left him a voicemail on July 9 to schedule follow-up with Dr. Harrell Gave.  No chest pain or shortness of breath reported A/P: CAD could certainly contribute to worsening fatigue but I still think not eating consistently/possible lower cbgs (though few ture lows) are primary cause-encouraged cardiology follow-up regardless as they reached out to him last month but he has not called them back-we will also check labs for any other obvious causes  Prostate cancer [facility]) S: Patient continues to self catheterize.  Prostate cancer diagnosed 2008- patient opted for no treatment at that time.  Patient remains on Flomax Lab Results  Component Value Date   PSA 17.10 06/01/2017   PSA 8.14 (H) 07/29/2010   A/P: Prostate cancer could certainly be contributing to fatigue issues-potential for metastasis certainly present.  Discussed following up with urology for further evaluation- he declines for now  but agrees to consider if continued weight loss with 3 meals a day at 1-2 month follow up   Diastolic dysfunction  S: Patient was on Lasix in the past.  No recent weight gain-in fact patient has lost 17 pounds over the last 5 months A/P: weight down and does not appear to have worsening fluid overload- doubt chmf as caus eof symptoms   Hypertension associated with diabetes ([place]) -  S: Well controlled on benazepril 40 mg.  Hydrochlorothiazide in the past but do not see on current medication list - we stopped in march 2020 and Bps remain controlled A/P:  Stable. Continue current medications.    Hyperlipidemia associated with type 2 diabetes mellitus (HCC)  S: Reasonably controlled on lovastatin 40 mg Lab Results  Component Value Date   CHOL 121 08/13/2018   HDL 42.10 08/13/2018   LDLCALC 46 08/13/2018   LDLDIRECT 72.0 03/23/2017   TRIG 163.0 (H) 08/13/2018   CHOLHDL 3 08/13/2018  A/P:  Stable. Continue current medications.    Recommended follow up: 1-2 months  Lab/Order associations:   ICD-10-CM   1. Fatigue, unspecified type  R53.83 TSH    Ambulatory referral to Cardiology  2. Controlled type 2 diabetes mellitus with other circulatory complication, with long-term current use of insulin (HCC)  E11.59 CBC   Z79.4 Comprehensive metabolic panel    Hemoglobin A1c  3. Coronary artery disease involving native coronary artery of native heart without angina pectoris  I25.10 Ambulatory referral to Cardiology  4. Prostate cancer ([place])  C61 PSA    CANCELED: Ambulatory referral to Urology  5. Hypertension associated with diabetes ([place])  E11.59    I10   6. Hyperlipidemia associated with type 2 diabetes mellitus (HCC)  E11.69    E78.5   7. Diastolic dysfunction  F02.77   8. Need for influenza vaccination  Z23 Flu Vaccine QUAD High Dose(Fluad)   Return precautions advised.  [name], MD

## 2019-06-12 NOTE — Patient Instructions (Addendum)
Health Maintenance Due  Topic Date Due  . OPHTHALMOLOGY EXAM  -[facility] 01/05/2019  . INFLUENZA VACCINE-today high dose 05/25/2019   We are going to look into causes of fatigue with blood work today.  We will call you within two weeks about your referral to cardiology. If you do not hear within 3 weeks, give [place] a call.   You agreed to eat 3 meals a day everyday even if you dont feel like it! Follow up with me in 1-2 months- if not feeling better at that point then we will ge tyou back to urology  Please stop by lab before you go If you do not have mychart- we will call you about results within 5 business days of [place] receiving them.  If you have mychart- we will send your results within 3 business days of [place] receiving them.  If abnormal or we want to clarify a result, we will call or mychart you to make sure you receive the message.  If you have questions or concerns or don't hear within 5-7 days, please send [place] a message or call us.

## 2019-06-26 NOTE — Progress Notes (Deleted)
Cardiology Office Note    Date:  06/26/2019   ID:  [name], DOB 17-Sep-1921, MRN [id number]  PCP:  [name], MD  Cardiologist: Buford Dresser, MD EPS: None  No chief complaint on file.   History of Present Illness:  [name] is a 85 y.o. male with history of CAD status post CABG in 2003, hypertension, DM, echo in 2014 with normal LVEF 55 to 65% with grade 1 DD, mild AS and MR.  Patient was lost to follow-up for several years and last office visit was in 04/2017 at which time he had some lower extremity edema and 2D echo was done LVEF 60 to 65% mild to moderate aortic regurgitation, grade 1 DD.  Patient saw Dr. Rushie Chestnut 06/12/2019 with generalized weakness and fatigue but could be related to low appetite although there was a concern for malignancy with 17 pound  weight loss and history of prostate cancer but he declined urology visit.  Past Medical History:  Diagnosis Date  . Arthritis   . CAD (coronary artery disease)    post diaphragjatic wall infarction and subsequent coronary artery bypass graft surgery in 2003, now stable  . Diabetes mellitus   . Diastolic CHF ([place]) [id number]   02/2013 Echo Grade I diastoilc dysfunction. Off lasix for several months 40mg - 04/14/16. Continue to monitor  . Elevated PSA   . GERD (gastroesophageal reflux disease)   . Hemorrhoids   . Hx of adenomatous colonic polyps   . Hyperlipidemia   . Hypertension   . Prostate cancer ([place])   . Urinary retention     Past Surgical History:  Procedure Laterality Date  . CORONARY ARTERY BYPASS GRAFT      Current Medications: No outpatient medications have been marked as taking for the 07/09/19 encounter (Appointment) with Imogene Burn, PA-C.     Allergies:   Patient has no known allergies.   Social History   Socioeconomic History  . Marital status: Divorced    Spouse name: Not on file  . Number of children: Not on file  . Years of education: Not on file  .  Highest education level: Not on file  Occupational History  . Occupation: Retired    Fish farm manager: [employer]  Social Needs  . Financial resource strain: Not on file  . Food insecurity    Worry: Not on file    Inability: Not on file  . Transportation needs    Medical: Not on file    Non-medical: Not on file  Tobacco Use  . Smoking status: Former Smoker    Quit date: 10/25/1948    Years since quitting: 70.7  . Smokeless tobacco: Never Used  Substance and Sexual Activity  . Alcohol use: No  . Drug use: No  . Sexual activity: Not Currently  Lifestyle  . Physical activity    Days per week: Not on file    Minutes per session: Not on file  . Stress: Not on file  Relationships  . Social Herbalist on phone: Not on file    Gets together: Not on file    Attends religious service: Not on file    Active member of club or organization: Not on file    Attends meetings of clubs or organizations: Not on file    Relationship status: Not on file  Other Topics Concern  . Not on file  Social History Narrative   Lives with autistic son (patient cares for  him)   Divorced. 4 children. 7 grandkids. 2 greatgrandkids.       Retired from working at [employer] in [employer]: go to gym 3 days a week, tv and sports     Family History:  The patient's ***family history includes Diabetes in his mother; Heart disease in his mother.   ROS:   Please see the history of present illness.    ROS All other systems reviewed and are negative.   PHYSICAL EXAM:   VS:  There were no vitals taken for this visit.  Physical Exam  GEN: Well nourished, well developed, in no acute distress  HEENT: normal  Neck: no JVD, carotid bruits, or masses Cardiac:RRR; no murmurs, rubs, or gallops  Respiratory:  clear to auscultation bilaterally, normal work of breathing GI: soft, nontender, nondistended, + BS Ext: without cyanosis, clubbing, or edema, Good distal pulses bilaterally  MS: no deformity or atrophy  Skin: warm and dry, no rash Neuro:  Alert and Oriented x 3, Strength and sensation are intact Psych: euthymic mood, full affect  Wt Readings from Last 3 Encounters:  06/12/19 204 lb 12.8 oz (92.9 kg)  01/08/19 221 lb 8 oz (100.5 kg)  01/03/19 223 lb 6.4 oz (101.3 kg)      Studies/Labs Reviewed:   EKG:  EKG is*** ordered today.  The ekg ordered today demonstrates ***  Recent Labs: 06/12/2019: ALT 7; BUN 27; Creatinine, Ser 1.42; Hemoglobin 10.3; Platelets 415.0; Potassium 3.8; Sodium 141; TSH 1.98   Lipid Panel    Component Value Date/Time   CHOL 121 08/13/2018 1001   TRIG 163.0 (H) 08/13/2018 1001   HDL 42.10 08/13/2018 1001   CHOLHDL 3 08/13/2018 1001   VLDL 32.6 08/13/2018 1001   LDLCALC 46 08/13/2018 1001   LDLDIRECT 72.0 03/23/2017 1105    Additional studies/ records that were reviewed today include:  2D echo 2018------------------------------------------------------------------- Study Conclusions   - Left ventricle: The cavity size was normal. There was moderate   concentric hypertrophy. Systolic function was normal. The   estimated ejection fraction was in the range of 60% to 65%. Wall   motion was normal; there were no regional wall motion   abnormalities. There was an increased relative contribution of   atrial contraction to ventricular filling. Doppler parameters are   consistent with abnormal left ventricular relaxation (grade 1   diastolic dysfunction). - Aortic valve: Trileaflet; normal thickness, mildly calcified   leaflets. There was mild to moderate regurgitation. - Pulmonic valve: There was mild regurgitation. - Pulmonary arteries: PA peak pressure: 33 mm Hg (S).     ASSESSMENT:    No diagnosis found.   PLAN:  In order of problems listed above:  Fatigue with 17 pound weight loss and decreased appetite  CAD status post CABG in 2003  Lower extremity edema with grade 1 DD on 2D echo 2018 normal LVEF  Mild to  moderate aortic insufficiency on echo in 2018  Essential hypertension  Hyperlipidemia    Medication Adjustments/Labs and Tests Ordered: Current medicines are reviewed at length with the patient today.  Concerns regarding medicines are outlined above.  Medication changes, Labs and Tests ordered today are listed in the Patient Instructions below. There are no Patient Instructions on file for this visit.   Sumner Boast, PA-C  06/26/2019 3:59 PM    [facility] [address], [place], [place]  [address] Phone: [phone]; Fax: [phone]

## 2019-07-04 ENCOUNTER — Other Ambulatory Visit: Payer: Self-pay | Admitting: Family Medicine

## 2019-07-09 ENCOUNTER — Ambulatory Visit: Payer: Medicare HMO | Admitting: Physician Assistant

## 2019-07-22 NOTE — Progress Notes (Signed)
Phone [phone]   Subjective:  [name] is a 85 y.o. year old very pleasant male patient who presents for/with See problem oriented charting Chief Complaint  Patient presents with  . Follow-up  . Fatigue  . Diabetes  . Hyperlipidemia  . Hypertension   ROS- No chest pain or shortness of breath. No headache or blurry vision.     Past Medical History-  Patient Active Problem List   Diagnosis Date Noted  . Epistaxis, recurrent 12/31/2018    Priority: High  . Diastolic dysfunction [id number]    Priority: High  . Prostate cancer ([place]) 08/21/2008    Priority: High  . Diabetes mellitus type II, controlled ([place]) 06/04/2007    Priority: High  . CAD (coronary artery disease) 06/04/2007    Priority: High  . Insomnia 07/06/2017    Priority: Medium  . Hyperlipidemia associated with type 2 diabetes mellitus ([place]) 06/04/2007    Priority: Medium  . Hypertension associated with diabetes ([place]) 06/04/2007    Priority: Medium  . Constipation 06/16/2010    Priority: Low  . GERD 08/04/2009    Priority: Low  . Umbilical hernia 99991111    Priority: Low  . Hiatal hernia 02/13/2008    Priority: Low  . DEGENERATIVE DISC DISEASE, CERVICAL SPINE 02/13/2008    Priority: Low  . Hematemesis 01/01/2019  . Cellulitis of left eyelid 01/01/2019  . Acute kidney injury ([place]) 01/01/2019  . Heme positive stool     Medications- reviewed and updated Current Outpatient Medications  Medication Sig Dispense Refill  . ACCU-CHEK AVIVA PLUS test strip TEST BLOOD SUGAR TWICE DAILY 200 strip 0  . ACCU-CHEK SOFTCLIX LANCETS lancets USE  TO  TEST TWICE DAILY 200 each 3  . Alcohol Swabs (B-D SINGLE USE SWABS REGULAR) PADS     . benazepril (LOTENSIN) 40 MG tablet TAKE 1 TABLET EVERY DAY 90 tablet 0  . ferrous sulfate 325 (65 FE) MG tablet Take 1 tablet (325 mg total) by mouth 2 (two) times daily with a meal. 60 tablet 3  . insulin NPH-regular Human (NOVOLIN 70/30) (70-30) 100 UNIT/ML injection  Inject 12 units into the skin each morning and 6 units into the skin each evening. 10 mL 5  . Insulin Pen Needle (BD PEN NEEDLE NANO U/F) 32G X 4 MM MISC USE TWICE DAILY. DX: E11.9 90 each 11  . lovastatin (MEVACOR) 40 MG tablet TAKE 1 TABLET (40 MG TOTAL) BY MOUTH AT BEDTIME. 90 tablet 0  . metFORMIN (GLUCOPHAGE) 500 MG tablet TAKE 1 TABLET TWICE DAILY WITH A MEAL (Patient taking differently: Take 500 mg by mouth 2 (two) times daily with a meal. ) 180 tablet 3  . pantoprazole (PROTONIX) 40 MG tablet Take 1 tablet (40 mg total) by mouth daily at 6 (six) AM. 30 tablet 0  . tamsulosin (FLOMAX) 0.4 MG CAPS capsule Take 1 capsule (0.4 mg total) by mouth daily. 30 capsule 0  . vitamin B-12 (CYANOCOBALAMIN) 100 MCG tablet Take 50 mcg by mouth daily.      . vitamin C (ASCORBIC ACID) 500 MG tablet Take 500 mg by mouth daily.       No current facility-administered medications for this visit.      Objective:  BP 136/78   Pulse 76   Temp 99.8 F (37.7 C)   Ht 5\' 8"  (1.727 m)   Wt 206 lb 12.8 oz (93.8 kg)   SpO2 97%   BMI 31.44 kg/m  Gen: NAD, resting comfortably CV: RRR no  murmurs rubs or gallops Lungs: CTAB no crackles, wheeze, rhonchi Abdomen: soft/nontender/nondistended/normal bowel sounds. No rebound or guarding.  Ext: no edema Skin: warm, dry     Assessment and Plan   #Generalized weakness/fatigue #Diabetes #CAD S: At last visit 6 weeks ago patient had been experiencing symptoms for about 2 months.  No chest pain or shortness of breath.  He has been experiencing a low appetite.  Felt like balance was not the best.  He noted in particular fatigue seem to worsen if he skipped meals-he was noted to have a low CBG on blood work at that time which went along with the theory that hypoglycemia could be causing his symptoms.  We opted to target an A1c under 8.5.  PSA was also very elevated but not increased from 2 years ago-patient with known history of prostate cancer but he has decided not  to treat.  We did not decrease insulin from 12 units in the morning and 6 units in the evening but were planning to do that if symptoms did not improve   Has coronary artery disease but we did not think that was the likely cause of symptoms but I did encourage follow-up with cardiology.  Has history of diastolic dysfunction but did not think CHF as cause of symptoms  Today--he reports blood sugar this morning was 110 and he is eating 3 meals a day and with this feels much better.  Did have several CBC abnormalities-opted to repeat today Lab Results  Component Value Date   WBC 13.7 (H) 06/12/2019   HGB 10.3 (L) 06/12/2019   HCT 32.7 (L) 06/12/2019   MCV 89.4 06/12/2019   PLT 415.0 (H) 06/12/2019  A/P: Patient's fatigue is much improved with eating regular meals- suspect periods of weakness/fatigue were related to hypoglycemia.  Continue NPH/R insulin 12 units in the morning and 6 units in the evening along with metformin as long as eating 3 meals a day  See discussion above.  Due to CBC abnormalities we will repeat- if fatigue worsens or WBC elevation does not improve-would consider hematology consult  Did note mild temperature elevation today- patient will monitor for signs of illness  Prior some low-level concern about CAD but given resolution of fatigue and still with no chest pain or shortness of breath-we will continue to monitor only.  Continue current medications- earlier this year was off aspirin given history of severe epistasis March 2020 along with baseline anemia.  After visit noted patient did not schedule cardiology follow-up-we will need to continue to encourage - We will need to add aspirin back on medication list next visit as he was to restart after 1 week with no nosebleeds  #hypertension S: controlled on Benazepril 40mg  on recheck today. Last home check was 125/78 BP Readings from Last 3 Encounters:  07/29/19 136/78  06/12/19 122/72  01/08/19 130/60  A/P:  Stable.  Continue current medications.    Recommended follow up: 4 to 6 months follow-up recommended  Lab/Order associations:   ICD-10-CM   1. Fatigue, unspecified type  R53.83 CBC with Differential/Platelet    Comprehensive metabolic panel  2. Controlled type 2 diabetes mellitus with other circulatory complication, with long-term current use of insulin (HCC)  E11.59    Z79.4   3. Hypertension associated with diabetes ([place])  E11.59    I10   4. Coronary artery disease involving native coronary artery of native heart without angina pectoris  I25.10     Return precautions advised.  [name], MD

## 2019-07-22 NOTE — Patient Instructions (Addendum)
Health Maintenance Due  Topic Date Due  . OPHTHALMOLOGY EXAM -need to schedule 01/05/2019    Please stop by lab before you go If you do not have mychart- we will call you about results within 5 business days of [place] receiving them.  If you have mychart- we will send your results within 3 business days of [place] receiving them.  If abnormal or we want to clarify a result, we will call or mychart you to make sure you receive the message.  If you have questions or concerns or don't hear within 5-7 days, please send [place] a message or call us.    4-6 month follow up or sooner if you have fatigue again like you did

## 2019-07-29 ENCOUNTER — Ambulatory Visit (INDEPENDENT_AMBULATORY_CARE_PROVIDER_SITE_OTHER): Payer: Medicare HMO | Admitting: Family Medicine

## 2019-07-29 ENCOUNTER — Encounter: Payer: Self-pay | Admitting: Family Medicine

## 2019-07-29 VITALS — BP 136/78 | HR 76 | Temp 99.8°F | Ht 68.0 in | Wt 206.8 lb

## 2019-07-29 DIAGNOSIS — R5383 Other fatigue: Secondary | ICD-10-CM | POA: Diagnosis not present

## 2019-07-29 DIAGNOSIS — I152 Hypertension secondary to endocrine disorders: Secondary | ICD-10-CM

## 2019-07-29 DIAGNOSIS — E1159 Type 2 diabetes mellitus with other circulatory complications: Secondary | ICD-10-CM | POA: Diagnosis not present

## 2019-07-29 DIAGNOSIS — I2510 Atherosclerotic heart disease of native coronary artery without angina pectoris: Secondary | ICD-10-CM

## 2019-07-29 DIAGNOSIS — Z794 Long term (current) use of insulin: Secondary | ICD-10-CM

## 2019-07-29 DIAGNOSIS — I10 Essential (primary) hypertension: Secondary | ICD-10-CM | POA: Diagnosis not present

## 2019-07-29 LAB — CBC WITH DIFFERENTIAL/PLATELET
Basophils Absolute: 0 10*3/uL (ref 0.0–0.1)
Basophils Relative: 0.4 % (ref 0.0–3.0)
Eosinophils Absolute: 0.1 10*3/uL (ref 0.0–0.7)
Eosinophils Relative: 1.1 % (ref 0.0–5.0)
HCT: 33.3 % — ABNORMAL LOW (ref 39.0–52.0)
Hemoglobin: 10.8 g/dL — ABNORMAL LOW (ref 13.0–17.0)
Lymphocytes Relative: 26.4 % (ref 12.0–46.0)
Lymphs Abs: 2 10*3/uL (ref 0.7–4.0)
MCHC: 32.5 g/dL (ref 30.0–36.0)
MCV: 90.8 fl (ref 78.0–100.0)
Monocytes Absolute: 0.7 10*3/uL (ref 0.1–1.0)
Monocytes Relative: 9.4 % (ref 3.0–12.0)
Neutro Abs: 4.7 10*3/uL (ref 1.4–7.7)
Neutrophils Relative %: 62.7 % (ref 43.0–77.0)
Platelets: 350 10*3/uL (ref 150.0–400.0)
RBC: 3.66 Mil/uL — ABNORMAL LOW (ref 4.22–5.81)
RDW: 15.6 % — ABNORMAL HIGH (ref 11.5–15.5)
WBC: 7.5 10*3/uL (ref 4.0–10.5)

## 2019-07-29 LAB — COMPREHENSIVE METABOLIC PANEL
ALT: 7 U/L (ref 0–53)
AST: 9 U/L (ref 0–37)
Albumin: 3.8 g/dL (ref 3.5–5.2)
Alkaline Phosphatase: 46 U/L (ref 39–117)
BUN: 26 mg/dL — ABNORMAL HIGH (ref 6–23)
CO2: 26 mEq/L (ref 19–32)
Calcium: 9.6 mg/dL (ref 8.4–10.5)
Chloride: 106 mEq/L (ref 96–112)
Creatinine, Ser: 1.32 mg/dL (ref 0.40–1.50)
GFR: 60.76 mL/min (ref >60.00)
Glucose, Bld: 155 mg/dL — ABNORMAL HIGH (ref 70–99)
Potassium: 4.9 mEq/L (ref 3.5–5.1)
Sodium: 138 mEq/L (ref 135–145)
Total Bilirubin: 0.4 mg/dL (ref 0.2–1.2)
Total Protein: 6.6 g/dL (ref 6.0–8.3)

## 2019-08-29 ENCOUNTER — Other Ambulatory Visit: Payer: Self-pay | Admitting: Family Medicine

## 2019-08-30 NOTE — Telephone Encounter (Signed)
Discontinued. Per last OV note only taking Benazepril.

## 2019-08-31 ENCOUNTER — Other Ambulatory Visit: Payer: Self-pay | Admitting: Family Medicine

## 2019-09-04 DIAGNOSIS — H52223 Regular astigmatism, bilateral: Secondary | ICD-10-CM | POA: Diagnosis not present

## 2019-09-04 DIAGNOSIS — H524 Presbyopia: Secondary | ICD-10-CM | POA: Diagnosis not present

## 2019-09-04 DIAGNOSIS — E119 Type 2 diabetes mellitus without complications: Secondary | ICD-10-CM | POA: Diagnosis not present

## 2019-09-04 DIAGNOSIS — Z7984 Long term (current) use of oral hypoglycemic drugs: Secondary | ICD-10-CM | POA: Diagnosis not present

## 2019-09-04 DIAGNOSIS — Z0100 Encounter for examination of eyes and vision without abnormal findings: Secondary | ICD-10-CM | POA: Diagnosis not present

## 2019-09-04 DIAGNOSIS — I10 Essential (primary) hypertension: Secondary | ICD-10-CM | POA: Diagnosis not present

## 2019-09-04 DIAGNOSIS — H25813 Combined forms of age-related cataract, bilateral: Secondary | ICD-10-CM | POA: Diagnosis not present

## 2019-09-04 DIAGNOSIS — H5203 Hypermetropia, bilateral: Secondary | ICD-10-CM | POA: Diagnosis not present

## 2019-09-04 DIAGNOSIS — Z794 Long term (current) use of insulin: Secondary | ICD-10-CM | POA: Diagnosis not present

## 2019-09-04 LAB — HM DIABETES EYE EXAM

## 2019-09-30 ENCOUNTER — Other Ambulatory Visit: Payer: Self-pay | Admitting: Family Medicine

## 2019-11-28 ENCOUNTER — Other Ambulatory Visit: Payer: Self-pay | Admitting: Family Medicine

## 2019-12-03 ENCOUNTER — Other Ambulatory Visit: Payer: Self-pay | Admitting: Family Medicine

## 2019-12-03 IMAGING — DX DG CHEST 2V
2 series · 2 of 2 positions shown · non-contrast
Comparison: 03/27/2016.  09/01/2009.

CLINICAL DATA: Right-sided chest pain.

EXAM:
CHEST - 2 VIEW

[chest pa]
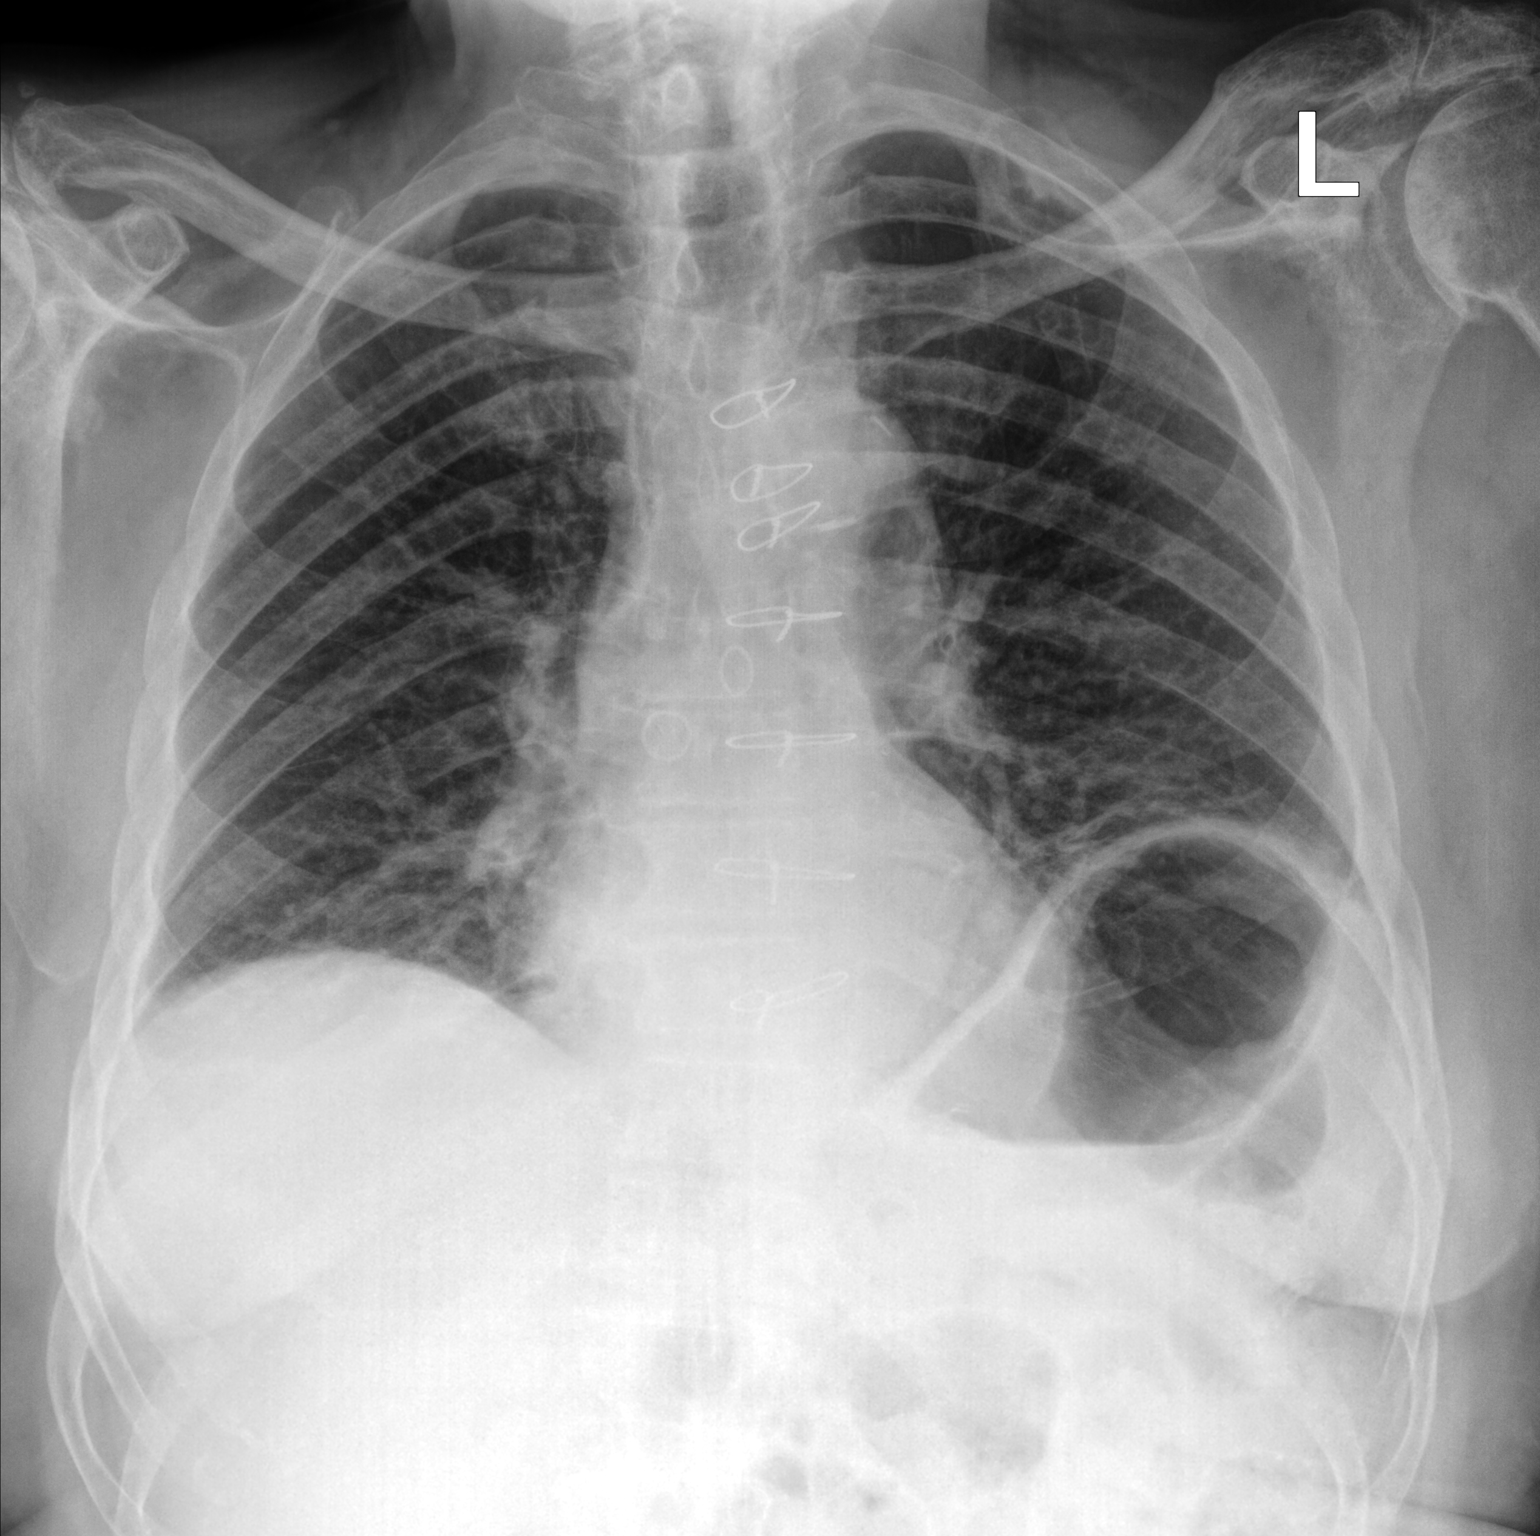

[chest lat]
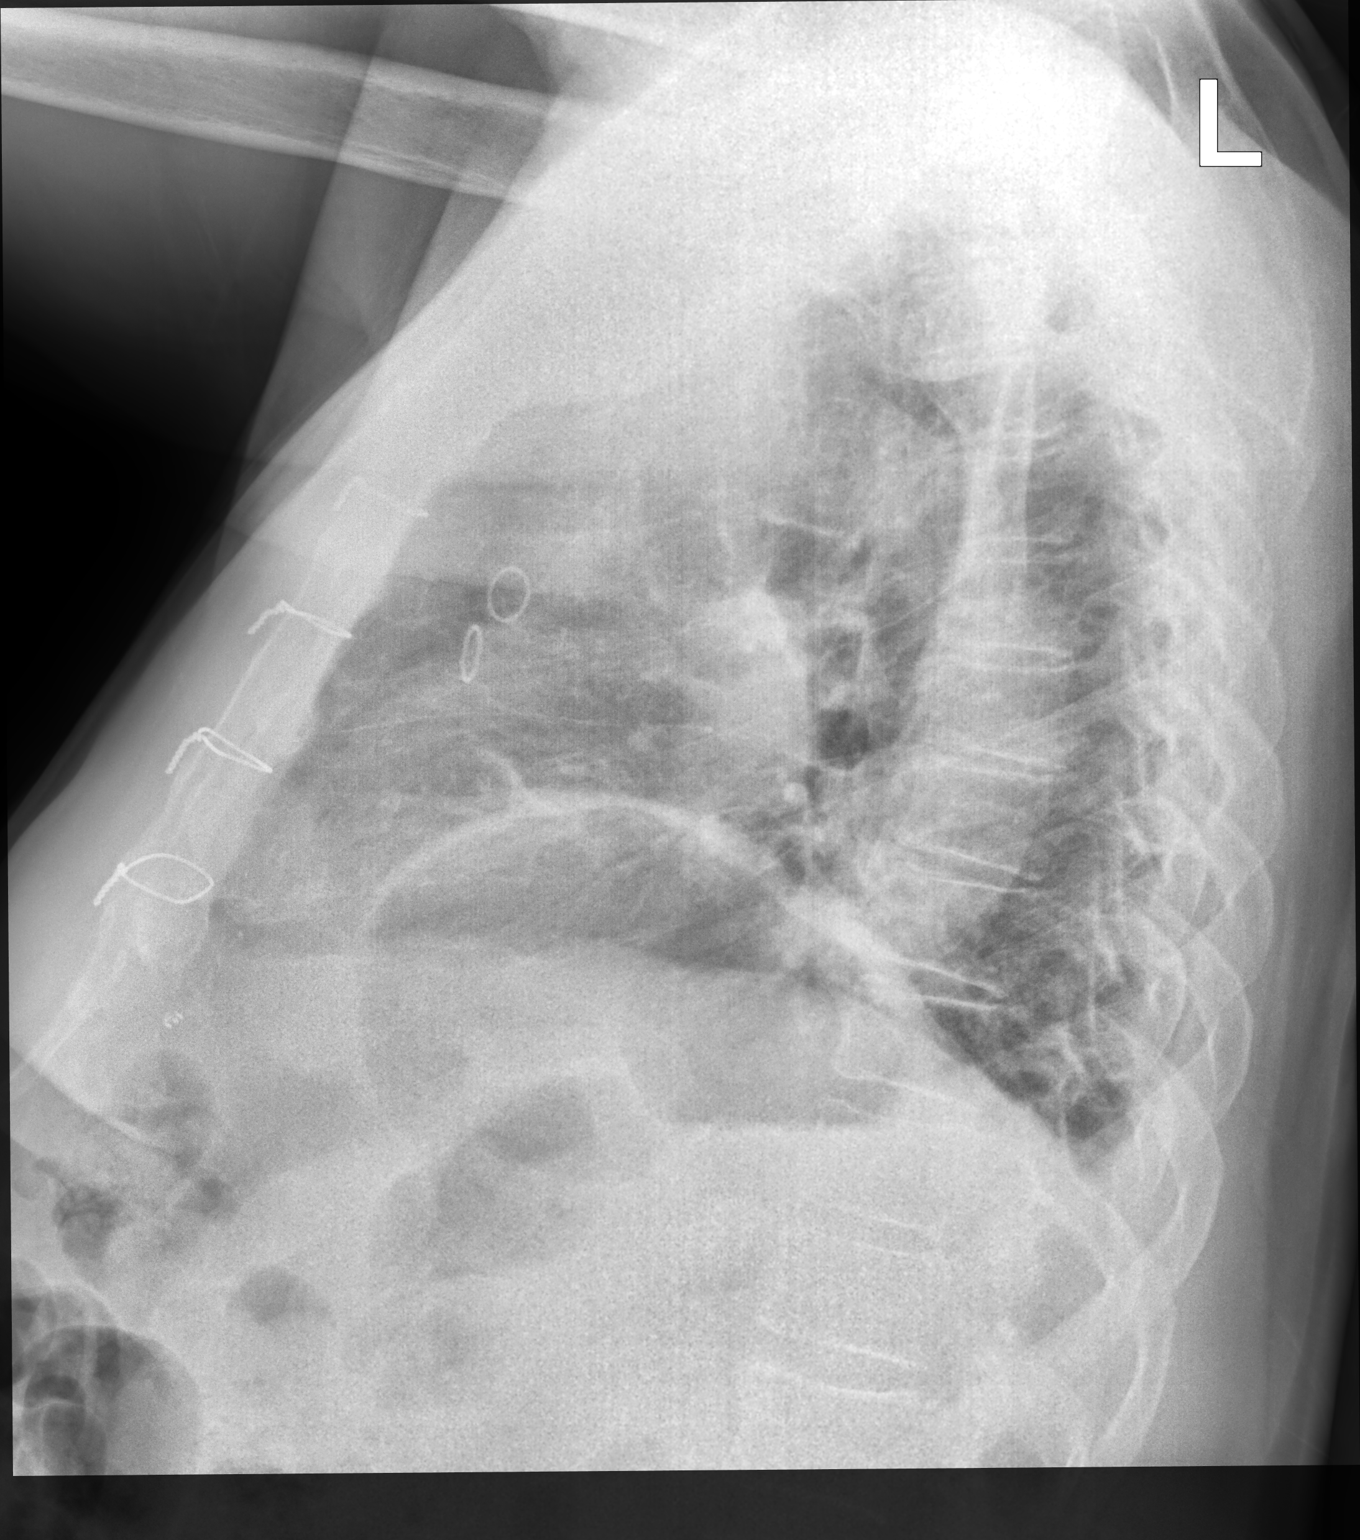

[2 of 2 positions shown; findings below may reference images not displayed]

FINDINGS: Prior CABG. Heart size normal. Low lung volumes with mild bibasilar
atelectasis. Stable elevation left hemidiaphragm. No pneumothorax.
Degenerative change thoracic spine. Degenerative changes both
shoulders.
IMPRESSION: 1.  Prior CABG.  Heart size normal.

2. Low lung volumes with mild bibasilar atelectasis. Stable
elevation left hemidiaphragm.

## 2019-12-19 ENCOUNTER — Encounter: Payer: Self-pay | Admitting: Family Medicine

## 2019-12-19 ENCOUNTER — Ambulatory Visit (INDEPENDENT_AMBULATORY_CARE_PROVIDER_SITE_OTHER): Payer: Medicare HMO

## 2019-12-19 ENCOUNTER — Other Ambulatory Visit: Payer: Medicare HMO

## 2019-12-19 ENCOUNTER — Telehealth: Payer: Self-pay | Admitting: Family Medicine

## 2019-12-19 ENCOUNTER — Other Ambulatory Visit: Payer: Self-pay

## 2019-12-19 ENCOUNTER — Ambulatory Visit (INDEPENDENT_AMBULATORY_CARE_PROVIDER_SITE_OTHER): Payer: Medicare HMO | Admitting: Family Medicine

## 2019-12-19 VITALS — BP 140/100 | HR 91 | Temp 98.5°F | Ht 68.0 in | Wt 214.0 lb

## 2019-12-19 DIAGNOSIS — R0781 Pleurodynia: Secondary | ICD-10-CM

## 2019-12-19 DIAGNOSIS — S299XXA Unspecified injury of thorax, initial encounter: Secondary | ICD-10-CM | POA: Diagnosis not present

## 2019-12-19 DIAGNOSIS — R0789 Other chest pain: Secondary | ICD-10-CM

## 2019-12-19 MED ORDER — TRAMADOL HCL 50 MG PO TABS: 50.0000 mg | ORAL_TABLET | Freq: Three times a day (TID) | ORAL | 0 refills | Status: AC | PRN

## 2019-12-19 NOTE — Telephone Encounter (Signed)
Noted, see office visit note.

## 2019-12-19 NOTE — Patient Instructions (Addendum)
1) can take tylenol 500mg  every 6 hours as needed for pain  2) tramadol is a narcotic drug. Only take for severe pain and be careful as this can make you drowsy.   3) get over the counter voltaren gel and can rub on ribs up to four times a day  4) make sure and do deep breathing to keep those lungs opened  5) heating pad   6) let dr. Yong Channel know if you feel like it's not getting better, but can be sore for a few weeks.   Nice to meet you!  Dr. Rogers Blocker    Rib Contusion A rib contusion is a deep bruise on your rib area. Contusions are the result of a blunt trauma that causes bleeding and injury to the tissues under the skin. A rib contusion may involve bruising of the ribs and of the skin and muscles in the area. The skin over the contusion may turn blue, purple, or yellow. Minor injuries will give you a painless contusion. More severe contusions may stay painful and swollen for a few weeks. What are the causes? This condition is usually caused by a blow, trauma, or direct force to an area of the body. This often occurs while playing contact sports. What are the signs or symptoms? Symptoms of this condition include:  Swelling and redness of the injured area.  Discoloration of the injured area.  Tenderness and soreness of the injured area.  Pain with or without movement. How is this diagnosed? This condition may be diagnosed based on:  Your symptoms and medical history.  A physical exam.  Imaging tests-such as an X-ray, CT scan, or MRI-to determine if there were internal injuries or broken bones (fractures). How is this treated? This condition may be treated with:  Rest. This is often the best treatment for a rib contusion.  Icing. This reduces swelling and inflammation.  Deep-breathing exercises. These may be recommended to reduce the risk for lung collapse and pneumonia.  Medicines. Over-the-counter or prescription medicines may be given to control pain.  Injection of a  numbing medicine around the nerve near your injury (nerve block). Follow these instructions at home:     Medicines  Take over-the-counter and prescription medicines only as told by your health care provider.  Do not drive or use heavy machinery while taking prescription pain medicine.  If you are taking prescription pain medicine, take actions to prevent or treat constipation. Your health care provider may recommend that you: ? Drink enough fluid to keep your urine pale yellow. ? Eat foods that are high in fiber, such as fresh fruits and vegetables, whole grains, and beans. ? Limit foods that are high in fat and processed sugars, such as fried or sweet foods. ? Take an over-the-counter or prescription medicine for constipation. Managing pain, stiffness, and swelling  If directed, put ice on the injured area: ? Put ice in a plastic bag. ? Place a towel between your skin and the bag. ? Leave the ice on for 20 minutes, 2-3 times a day.  Rest the injured area. Avoid strenuous activity and any activities or movements that cause pain. Be careful during activities and avoid bumping the injured area.  Do not lift anything that is heavier than 5 lb (2.3 kg), or the limit that you are told, until your health care provider says that it is safe. General instructions  Do not use any products that contain nicotine or tobacco, such as cigarettes and e-cigarettes. These can  delay healing. If you need help quitting, ask your health care provider.  Do deep-breathing exercises as told by your health care provider.  If you were given an incentive spirometer, use it every 1-2 hours while you are awake, or as recommended by your health care provider. This device measures how well you are filling your lungs with each breath.  Keep all follow-up visits as told by your health care provider. This is important. Contact a health care provider if you have:  Increased bruising or swelling.  Pain that is not  controlled with treatment.  A fever. Get help right away if you:  Have difficulty breathing or shortness of breath.  Develop a continual cough or you cough up thick or bloody sputum.  Feel nauseous or you vomit.  Have pain in your abdomen. Summary  A rib contusion is a deep bruise on your rib area. Contusions are the result of a blunt trauma that causes bleeding and injury to the tissues under the skin.  The skin overlying the contusion may turn blue, purple, or yellow. Minor injuries may give you a painless contusion. More severe contusions may stay painful and swollen for a few weeks.  Rest the injured area. Avoid strenuous activity and any activities or movements that cause pain. This information is not intended to replace advice given to you by your health care provider. Make sure you discuss any questions you have with your health care provider. Document Revised: 11/08/2017 Document Reviewed: 11/08/2017 Elsevier Patient Education  2020 [employer].

## 2019-12-19 NOTE — Progress Notes (Signed)
Patient: [name] MRN: [id number] DOB: 04/30/1921 PCP: [name], MD     Subjective:  Chief Complaint  Patient presents with  . Fall    Pt complains of pain on the right side. It says that it hurts to bend down.   Patient is deaf. Appointment with me, physician, writing down all questions.   HPI: [name] who presents today for Pain on his right side due to a fall. He went to the grocery store on Monday and when he came into his house he tripped and staggered and fell. He hit his table with his right side of his ribs. He states he had pain after this happens and it hurts him to bend over or down on the right side. Doesn't hurt to breath and he is not short of breath. He has no cough. He only has pain if he bends down or twists side to side. Rates pain as 10/10 if he does these movements. Denies any radiation. He has only taken a bayer aspirin and this did not help at all. He is on no chronic anticoagulation. Did not hit his head.   Review of Systems  Constitutional: Negative for chills and fever.  Respiratory: Negative for cough and shortness of breath.   Cardiovascular: Negative for chest pain and palpitations.  Gastrointestinal: Negative for abdominal pain, diarrhea, nausea and vomiting.  Musculoskeletal: Positive for arthralgias (right sided rib pain s/p fall ).    Allergies Patient has No Known Allergies.  Past Medical History Patient  has a past medical history of Arthritis, CAD (coronary artery disease), Diabetes mellitus, Diastolic CHF ([place]) ([id number]), Elevated PSA, GERD (gastroesophageal reflux disease), Hemorrhoids, adenomatous colonic polyps, Hyperlipidemia, Hypertension, Prostate cancer ([place]), and Urinary retention.  Surgical History Patient  has a past surgical history that includes Coronary artery bypass graft.  Family History Pateint's family history includes Diabetes in his mother; Heart disease in his mother.  Social  History Patient  reports that he quit smoking about 71 years ago. He has never used smokeless tobacco. He reports that he does not drink alcohol or use drugs.    Objective: Vitals:   12/19/19 1037  BP: (!) 140/100  Pulse: 91  Temp: 98.5 F (36.9 C)  TempSrc: Temporal  SpO2: 97%  Weight: 214 lb (97.1 kg)  Height: 5\' 8"  (1.727 m)    Body mass index is 32.54 kg/m.  Physical Exam Vitals reviewed.  Constitutional:      Appearance: Normal appearance. He is obese.     Comments: Deaf, can not hear   HENT:     Head: Normocephalic and atraumatic.  Cardiovascular:     Rate and Rhythm: Normal rate and regular rhythm.     Heart sounds: Normal heart sounds.  Pulmonary:     Effort: Pulmonary effort is normal.     Breath sounds: Normal breath sounds.  Abdominal:     General: Bowel sounds are normal.     Palpations: Abdomen is soft.  Musculoskeletal:     Comments: TTp over right lateral rib cage T4-T6.   Skin:    Comments: Bruising along right lateral rib cage.   Neurological:     General: No focal deficit present.     Mental Status: He is alert.  Psychiatric:        Mood and Affect: Mood normal.        Behavior: Behavior normal.    Rib xray: no acute fracture seen. Official read pending.  Assessment/plan: 1. Rib pain on right side Contusion of ribs from fall. Conservative treatment with heat, tylenol and tramadol for severe pain only. Wrote down and discussed via writing this is a narcotic and can cause drowsiness. He is only to use for severe pain. Also recommended voltaren gel prn. Discussed deep breathing as well. If not getting better he is to let us know, but discussed can take a few weeks to resolve.  - DG Ribs Unilateral Right; Future  Total time of encounter: 35 minutes total time of encounter, including 30 minutes spent in face-to-face patient care. This time includes coordination of care and counseling regarding writing entire appointment and discussing  diagnosis/medication/plan of care. Remainder of non-face-to-face time involved reviewing chart documents/testing relevant to the patient encounter and documentation in the medical record.   This visit occurred during the SARS-CoV-2 public health emergency.  Safety protocols were in place, including screening questions prior to the visit, additional usage of staff PPE, and extensive cleaning of exam room while observing appropriate contact time as indicated for disinfecting solutions.     Return if symptoms worsen or fail to improve.   Orma Flaming, MD [place]   12/19/2019

## 2019-12-19 NOTE — Telephone Encounter (Signed)
Patient is seeing Dr. Rogers Blocker 12/19/19 at 11am.     Chief Complaint CHEST PAIN (>=21 years) - pain, pressure, heaviness or tightness Reason for Call Symptomatic / Request for [name], thinks he has a rib fracture, and needing triaged before scheduling. Translation No Nurse Assessment Nurse: [name] Gibney, RN, [name] Date/Time (Eastern Time): 12/19/2019 9:32:59 AM Confirm and document reason for call. If symptomatic, describe symptoms. ---Caller states he fell on Monday on his right side. Has pain on his lower right side on his rib cage. Has pain when he coughs. No SOB. no fever. Has the patient had close contact with a person known or suspected to have the novel coronavirus illness OR traveled / lives in area with major community spread (including international travel) in the last 14 days from the onset of symptoms? * If Asymptomatic, screen for exposure and travel within the last 14 days. ---No Does the patient have any new or worsening symptoms? ---Yes Will a triage be completed? ---Yes Related visit to physician within the last 2 weeks? ---No Does the PT have any chronic conditions? (i.e. diabetes, asthma, this includes High risk factors for pregnancy, etc.) ---No Is this a behavioral health or substance abuse call? ---No Guidelines Guideline Title Affirmed Question Affirmed Notes Nurse Date/Time (Eastern Time) Chest Injury [1] High-risk adult (e.g., age > 17, osteoporosis, chronic steroid use) AND [2] still hurts [name], Therapist, sports, [place] 12/19/2019 9:35:56 AM Disp. Time [name] Time) Disposition Final User 12/19/2019 9:31:02 AM Send to Urgent [name], [name] PLEASE NOTE: All timestamps contained within this report are represented as [place] Standard Time. CONFIDENTIALTY NOTICE: This fax transmission is intended only for the addressee. It contains information that is legally privileged, confidential or otherwise protected  from use or disclosure. If you are not the intended recipient, you are strictly prohibited from reviewing, disclosing, copying using or disseminating any of this information or taking any action in reliance on or regarding this information. If you have received this fax in error, please notify us immediately by telephone so that we can arrange for its return to [place]. Phone: [phone], Toll-Free: [phone], Fax: [phone] Page: 2 of 2 Call Id: [id number] 12/19/2019 9:46:27 AM See PCP within 24 Hours Yes [name], Therapist, sports, [name]

## 2020-01-17 ENCOUNTER — Ambulatory Visit: Payer: Medicare HMO | Attending: Internal Medicine

## 2020-01-17 DIAGNOSIS — Z23 Encounter for immunization: Secondary | ICD-10-CM

## 2020-01-17 NOTE — Progress Notes (Signed)
   Covid-19 Vaccination Clinic  Name:  [name]    MRN: [id number] DOB: 1920-11-15  01/17/2020  [name] was observed post Covid-19 immunization for 15 minutes without incident. He was provided with Vaccine Information Sheet and instruction to access the V-Safe system.   [name] was instructed to call 911 with any severe reactions post vaccine: Marland Kitchen Difficulty breathing  . Swelling of face and throat  . A fast heartbeat  . A bad rash all over body  . Dizziness and weakness   Immunizations Administered    Name Date Dose VIS Date Route   Pfizer COVID-19 Vaccine 01/17/2020  3:46 PM 0.3 mL 10/04/2019 Intramuscular   Manufacturer: [place]   Lot: [id number]   [place]: [id number]

## 2020-02-07 ENCOUNTER — Other Ambulatory Visit: Payer: Self-pay | Admitting: Family Medicine

## 2020-02-10 ENCOUNTER — Telehealth: Payer: Self-pay

## 2020-02-10 NOTE — Telephone Encounter (Signed)
Yes thanks-should receive second vaccine.  If has a nosebleed that lasts more than 30 minutes with holding pressure should be seen in the office or urgent care

## 2020-02-10 NOTE — Telephone Encounter (Signed)
Patient niece would like to know would it be okay for patient to receive his second covid vaccine. Patient rcvd 1st pfizer on 01/17/20 a few days later patient experienced  a nose bleed and is unsure if pt should rcvd 2nd dose.

## 2020-02-11 ENCOUNTER — Ambulatory Visit: Payer: Medicare HMO | Attending: Internal Medicine

## 2020-02-11 DIAGNOSIS — Z23 Encounter for immunization: Secondary | ICD-10-CM

## 2020-02-11 NOTE — Progress Notes (Signed)
   Covid-19 Vaccination Clinic  Name:  [name]    MRN: [id number] DOB: 1921-07-27  02/11/2020  [name] was observed post Covid-19 immunization for 15 minutes without incident. He was provided with Vaccine Information Sheet and instruction to access the V-Safe system.   [name] was instructed to call 911 with any severe reactions post vaccine: Marland Kitchen Difficulty breathing  . Swelling of face and throat  . A fast heartbeat  . A bad rash all over body  . Dizziness and weakness   Immunizations Administered    Name Date Dose VIS Date Route   Pfizer COVID-19 Vaccine 02/11/2020  3:42 PM 0.3 mL 12/18/2018 Intramuscular   Manufacturer: [place]   Lot: [id number]   [place]: [id number]

## 2020-02-11 NOTE — Telephone Encounter (Signed)
Called and spoke with pt daughter and gave the below information.

## 2020-02-25 ENCOUNTER — Other Ambulatory Visit: Payer: Self-pay | Admitting: Family Medicine

## 2020-03-09 ENCOUNTER — Other Ambulatory Visit: Payer: Self-pay | Admitting: Family Medicine

## 2020-03-25 ENCOUNTER — Other Ambulatory Visit: Payer: Self-pay | Admitting: Family Medicine

## 2020-04-20 ENCOUNTER — Other Ambulatory Visit: Payer: Self-pay | Admitting: Family Medicine

## 2020-04-25 ENCOUNTER — Emergency Department (HOSPITAL_COMMUNITY)
Admission: EM | Admit: 2020-04-25 | Discharge: 2020-04-26 | Disposition: A | Discharge status: Home / Self Care | Payer: Medicare HMO | Attending: Emergency Medicine | Admitting: Emergency Medicine

## 2020-04-25 ENCOUNTER — Other Ambulatory Visit: Payer: Self-pay

## 2020-04-25 ENCOUNTER — Encounter (HOSPITAL_COMMUNITY): Payer: Self-pay | Admitting: Emergency Medicine

## 2020-04-25 DIAGNOSIS — E119 Type 2 diabetes mellitus without complications: Secondary | ICD-10-CM | POA: Diagnosis not present

## 2020-04-25 DIAGNOSIS — Z79899 Other long term (current) drug therapy: Secondary | ICD-10-CM | POA: Insufficient documentation

## 2020-04-25 DIAGNOSIS — Z87891 Personal history of nicotine dependence: Secondary | ICD-10-CM | POA: Insufficient documentation

## 2020-04-25 DIAGNOSIS — I5032 Chronic diastolic (congestive) heart failure: Secondary | ICD-10-CM | POA: Insufficient documentation

## 2020-04-25 DIAGNOSIS — I110 Hypertensive heart disease with heart failure: Secondary | ICD-10-CM | POA: Diagnosis not present

## 2020-04-25 DIAGNOSIS — R040 Epistaxis: Secondary | ICD-10-CM | POA: Diagnosis not present

## 2020-04-25 DIAGNOSIS — I2510 Atherosclerotic heart disease of native coronary artery without angina pectoris: Secondary | ICD-10-CM | POA: Diagnosis not present

## 2020-04-25 DIAGNOSIS — Z794 Long term (current) use of insulin: Secondary | ICD-10-CM | POA: Insufficient documentation

## 2020-04-25 LAB — CBG MONITORING, ED
Glucose-Capillary: 59 mg/dL — ABNORMAL LOW (ref 70–99)
Glucose-Capillary: 83 mg/dL (ref 70–99)

## 2020-04-25 NOTE — ED Notes (Signed)
CBG 83, triage nurse aware. Will check again in 1 hour.

## 2020-04-25 NOTE — ED Triage Notes (Signed)
Pt having nose bleed that started today, pt is unable to tell us if he is on blood thinner.

## 2020-04-25 NOTE — ED Notes (Signed)
CBG 59, triage nurse aware, orange juice given.

## 2020-04-26 LAB — CBG MONITORING, ED: Glucose-Capillary: 130 mg/dL — ABNORMAL HIGH (ref 70–99)

## 2020-04-26 MED ORDER — OXYMETAZOLINE HCL 0.05 % NA SOLN
1.0000 | Freq: Once | NASAL | Status: AC
  Administered 2020-04-26: 1 via NASAL
  Filled 2020-04-26: qty 30

## 2020-04-26 NOTE — ED Notes (Signed)
Patient verbalizes understanding of discharge instructions. Opportunity for questioning and answers were provided. Armband removed by staff, pt discharged from ED. Pt. ambulatory and discharged home.  

## 2020-04-26 NOTE — ED Provider Notes (Signed)
[facility] EMERGENCY DEPARTMENT Provider Note   CSN: [id number] Arrival date & time: 04/25/20  2130     History Chief Complaint  Patient presents with  . Epistaxis    [name] is a 86 y.o. male.  The history is provided by the patient.  Epistaxis Location:  R nare Severity:  Moderate Timing:  Constant Progression:  Improving Chronicity:  New Relieved by:  Nothing Worsened by:  Nothing Associated symptoms: no fever   Patient with history of CAD, diabetes presents with nosebleed Patient reports that his nose started bleeding spontaneously.  It is since improved.  No other acute complaints  patient is hard of hearing which limits exam     Past Medical History:  Diagnosis Date  . Arthritis   . CAD (coronary artery disease)    post diaphragjatic wall infarction and subsequent coronary artery bypass graft surgery in 2003, now stable  . Diabetes mellitus   . Diastolic CHF ([place]) 4/81/8563   02/2013 Echo Grade I diastoilc dysfunction. Off lasix for several months 40mg - 04/14/16. Continue to monitor  . Elevated PSA   . GERD (gastroesophageal reflux disease)   . Hemorrhoids   . Hx of adenomatous colonic polyps   . Hyperlipidemia   . Hypertension   . Prostate cancer ([place])   . Urinary retention     Patient Active Problem List   Diagnosis Date Noted  . Hematemesis 01/01/2019  . Cellulitis of left eyelid 01/01/2019  . Acute kidney injury ([place]) 01/01/2019  . Heme positive stool   . Epistaxis, recurrent 12/31/2018  . Insomnia 07/06/2017  . Diastolic dysfunction 14/97/0263  . Constipation 06/16/2010  . GERD 08/04/2009  . Prostate cancer ([place]) 08/21/2008  . Umbilical hernia 78/58/8502  . Hiatal hernia 02/13/2008  . DEGENERATIVE DISC DISEASE, CERVICAL SPINE 02/13/2008  . Diabetes mellitus type II, controlled ([place]) 06/04/2007  . Hyperlipidemia associated with type 2 diabetes mellitus ([place]) 06/04/2007  . Hypertension associated with diabetes ([place])  06/04/2007  . CAD (coronary artery disease) 06/04/2007    Past Surgical History:  Procedure Laterality Date  . CORONARY ARTERY BYPASS GRAFT         Family History  Problem Relation Age of Onset  . Heart disease Mother   . Diabetes Mother     Social History   Tobacco Use  . Smoking status: Former Smoker    Quit date: 10/25/1948    Years since quitting: 71.5  . Smokeless tobacco: Never Used  Substance Use Topics  . Alcohol use: No  . Drug use: No    Home Medications Prior to Admission medications   Medication Sig Start Date End Date Taking? Authorizing Provider  ACCU-CHEK AVIVA PLUS test strip TEST BLOOD SUGAR TWICE DAILY 02/27/20   [name], MD  ACCU-CHEK SOFTCLIX LANCETS lancets USE  TO  TEST TWICE DAILY 07/17/18   [name], MD  Alcohol Swabs (B-D SINGLE USE SWABS REGULAR) PADS  08/03/17   [provider]  benazepril (LOTENSIN) 40 MG tablet TAKE 1 TABLET EVERY DAY 04/20/20   [name], MD  ferrous sulfate 325 (65 FE) MG tablet Take 1 tablet (325 mg total) by mouth 2 (two) times daily with a meal. 01/02/19   Regalado, Belkys A, MD  hydrochlorothiazide (HYDRODIURIL) 25 MG tablet Take 1 tablet by mouth once daily 03/09/20   [name], MD  insulin NPH-regular Human (NOVOLIN 70/30) (70-30) 100 UNIT/ML injection Inject 12 units into the skin each morning and 6  units into the skin each evening. 01/02/19   Regalado, Belkys A, MD  Insulin Pen Needle (BD PEN NEEDLE NANO U/F) 32G X 4 MM MISC USE TWICE DAILY. DX: E11.9 01/05/17   [name], MD  Insulin Syringe-Needle U-100 (MONOJECT INS SYR 1CC/25G) 25G X 5/8" 1 ML MISC USE 1  TWICE DAILY AS NEEDED FOR  RELION  INSULIN  70/30 03/25/20   [name], MD  lovastatin (MEVACOR) 40 MG tablet TAKE 1 TABLET AT BEDTIME. 04/20/20   [name], MD  metFORMIN (GLUCOPHAGE) 500 MG tablet TAKE 1 TABLET TWICE DAILY WITH A MEAL 04/20/20   [name], MD  pantoprazole (PROTONIX) 40 MG tablet Take  1 tablet (40 mg total) by mouth daily at 6 (six) AM. 01/03/19   Regalado, Belkys A, MD  tamsulosin (FLOMAX) 0.4 MG CAPS capsule Take 1 capsule (0.4 mg total) by mouth daily. 01/30/19   [name], MD  vitamin B-12 (CYANOCOBALAMIN) 100 MCG tablet Take 50 mcg by mouth daily.      [provider]  vitamin C (ASCORBIC ACID) 500 MG tablet Take 500 mg by mouth daily.      [provider]    Allergies    Patient has no known allergies.  Review of Systems   Review of Systems  Constitutional: Negative for fever.  HENT: Positive for nosebleeds.     Physical Exam Updated Vital Signs BP (!) 148/82   Pulse 74   Temp 98.7 F (37.1 C) (Oral)   Resp 16   SpO2 100%   Physical Exam CONSTITUTIONAL: Elderly, no acute distress HEAD: Normocephalic/atraumatic EYES: EOMI ENMT: Mucous membranes moist, dried blood noted in right nare.  No active bleeding.  No other signs of bleeding NECK: supple no meningeal signs CV: S1/S2 noted, no murmurs/rubs/gallops noted LUNGS: Lungs are clear to auscultation bilaterally, no apparent distress ABDOMEN: soft NEURO: Pt is awake/alert/appropriate, moves all extremitiesx4.  No facial droop.  EXTREMITIES:full ROM SKIN: warm, color normal PSYCH: no abnormalities of mood noted, alert and oriented to situation  ED Results / Procedures / Treatments   Labs (all labs ordered are listed, but only abnormal results are displayed) Labs Reviewed  CBG MONITORING, ED - Abnormal; Notable for the following components:      Result Value   Glucose-Capillary 59 (*)    All other components within normal limits  CBG MONITORING, ED - Abnormal; Notable for the following components:   Glucose-Capillary 130 (*)    All other components within normal limits  CBG MONITORING, ED    EKG None  Radiology No results found.  Procedures Procedures   Medications Ordered in ED Medications  oxymetazoline (AFRIN) 0.05 % nasal spray 1 spray (1 spray Right Nare  Given 04/26/20 0424)    ED Course  I have reviewed the triage vital signs and the nursing notes.  Pertinent labs  results that were available during my care of the patient were reviewed by me and considered in my medical decision making (see chart for details).    MDM Rules/Calculators/A&P                          Patient's epistaxis has since resolved.  Will give Afrin.  Discussed his case with his niece via phone.  Patient is deaf but does not use sign language.  He does not have a legal guardian.  He lives with a son, but the niece helps him a lot.  She will come pick him up. Final Clinical Impression(s) / ED Diagnoses Final diagnoses:  Epistaxis    Rx / DC Orders ED Discharge Orders    None       Ripley Fraise, MD 04/26/20 [phone]

## 2020-04-26 NOTE — Discharge Instructions (Addendum)
You had a nosebleed.  If it bleeds again, please hold pressure for 15 minutes.  You can use the spray we gave you as needed to help stop the bleeding

## 2020-05-04 ENCOUNTER — Other Ambulatory Visit: Payer: Self-pay | Admitting: Family Medicine

## 2020-05-17 ENCOUNTER — Emergency Department (HOSPITAL_COMMUNITY)
Admission: EM | Admit: 2020-05-17 | Discharge: 2020-05-18 | Disposition: A | Discharge status: Home / Self Care | Payer: Medicare HMO | Attending: Emergency Medicine | Admitting: Emergency Medicine

## 2020-05-17 ENCOUNTER — Other Ambulatory Visit: Payer: Self-pay

## 2020-05-17 ENCOUNTER — Encounter (HOSPITAL_COMMUNITY): Payer: Self-pay | Admitting: Emergency Medicine

## 2020-05-17 DIAGNOSIS — I2510 Atherosclerotic heart disease of native coronary artery without angina pectoris: Secondary | ICD-10-CM | POA: Diagnosis not present

## 2020-05-17 DIAGNOSIS — Z794 Long term (current) use of insulin: Secondary | ICD-10-CM | POA: Insufficient documentation

## 2020-05-17 DIAGNOSIS — I110 Hypertensive heart disease with heart failure: Secondary | ICD-10-CM | POA: Diagnosis not present

## 2020-05-17 DIAGNOSIS — I5032 Chronic diastolic (congestive) heart failure: Secondary | ICD-10-CM | POA: Diagnosis not present

## 2020-05-17 DIAGNOSIS — R040 Epistaxis: Secondary | ICD-10-CM | POA: Insufficient documentation

## 2020-05-17 DIAGNOSIS — Z79899 Other long term (current) drug therapy: Secondary | ICD-10-CM | POA: Insufficient documentation

## 2020-05-17 DIAGNOSIS — E119 Type 2 diabetes mellitus without complications: Secondary | ICD-10-CM | POA: Diagnosis not present

## 2020-05-17 DIAGNOSIS — Z87891 Personal history of nicotine dependence: Secondary | ICD-10-CM | POA: Diagnosis not present

## 2020-05-17 MED ORDER — OXYMETAZOLINE HCL 0.05 % NA SOLN
1.0000 | Freq: Once | NASAL | Status: AC
  Administered 2020-05-18: 1 via NASAL
  Filled 2020-05-17: qty 30

## 2020-05-17 NOTE — ED Triage Notes (Signed)
Pt reports nosebleed since 2pm today.  No blood thinners.  Hard of hearing.

## 2020-05-18 NOTE — ED Notes (Signed)
No answer when called for a room 3 times

## 2020-05-18 NOTE — Discharge Instructions (Addendum)
You have been seen here for a nose bleed.  Your exam and vital signs look reassuring.  I recommend that if you have another nosebleed you can use the Afrin to help with the nosebleed.  Do not consistently use this medication as it can cause worsening nosebleeds.  Only use it if you have a nosebleed.  Then I want you to place your fingers around her nose and squeeze it tight and hold it for 10 to 15 minutes.  This should stop the bleeding.  If you held your nose close for 15 minutes and it is still bleeding, I want you to hold it for another 15 minutes.  If after that time it is still bleeding I want you to come to the emergency room to be evaluated.  I want you to follow-up with your primary care doctor in 1 week's time if symptoms continue as I feel you may need further evaluation management of your nosebleed.  I want you to come back to the emergency room if you have uncontrolled nosebleeding, increased weakness, shortness of breath, chest pain, nausea, vomiting, diarrhea, fever as these symptoms require further evaluation and management.

## 2020-05-18 NOTE — ED Provider Notes (Signed)
[facility] Provider Note   CSN: [id number] Arrival date & time: 05/17/20  1809     History Chief Complaint  Patient presents with  . Epistaxis    [name] is a 86 y.o. male.  HPI   Patient presents to the emergency department with chief complaint of nosebleed that started yesterday afternoon around 230pm.  Patient explains he was bleeding from both nostrils and placed cotton balls in his nostrils to help stop the bleeding.  He said he had to change cotton balls multiple times because it would not stop bleeding.  He states in the past he has had nosebleeds and they would stop  but this time he was unable to get the bleeding to stop.  patient denies recent trauma to his nose, denies falling, being on anticoags, shortness of breath, chest pain, fatigued.  He denies alleviating or aggravating factors.  He has significant medical history of CAD, diabetes, diastolic congestive heart failure, hypertension, prostate cancer.  He denies headache, fever, chills, shortness of breath, chest pain, nausea, vomiting, diarrhea, worsening pedal edema.  Past Medical History:  Diagnosis Date  . Arthritis   . CAD (coronary artery disease)    post diaphragjatic wall infarction and subsequent coronary artery bypass graft surgery in 2003, now stable  . Diabetes mellitus   . Diastolic CHF ([place]) 8/33/8250   02/2013 Echo Grade I diastoilc dysfunction. Off lasix for several months 40mg - 04/14/16. Continue to monitor  . Elevated PSA   . GERD (gastroesophageal reflux disease)   . Hemorrhoids   . Hx of adenomatous colonic polyps   . Hyperlipidemia   . Hypertension   . Prostate cancer ([place])   . Urinary retention     Patient Active Problem List   Diagnosis Date Noted  . Hematemesis 01/01/2019  . Cellulitis of left eyelid 01/01/2019  . Acute kidney injury ([place]) 01/01/2019  . Heme positive stool   . Epistaxis, recurrent 12/31/2018  . Insomnia 07/06/2017  .  Diastolic dysfunction 53/97/6734  . Constipation 06/16/2010  . GERD 08/04/2009  . Prostate cancer ([place]) 08/21/2008  . Umbilical hernia 19/37/9024  . Hiatal hernia 02/13/2008  . DEGENERATIVE DISC DISEASE, CERVICAL SPINE 02/13/2008  . Diabetes mellitus type II, controlled ([place]) 06/04/2007  . Hyperlipidemia associated with type 2 diabetes mellitus ([place]) 06/04/2007  . Hypertension associated with diabetes ([place]) 06/04/2007  . CAD (coronary artery disease) 06/04/2007    Past Surgical History:  Procedure Laterality Date  . CORONARY ARTERY BYPASS GRAFT         Family History  Problem Relation Age of Onset  . Heart disease Mother   . Diabetes Mother     Social History   Tobacco Use  . Smoking status: Former Smoker    Quit date: 10/25/1948    Years since quitting: 71.6  . Smokeless tobacco: Never Used  Substance Use Topics  . Alcohol use: No  . Drug use: No    Home Medications Prior to Admission medications   Medication Sig Start Date End Date Taking? Authorizing Provider  ACCU-CHEK AVIVA PLUS test strip TEST BLOOD SUGAR TWICE DAILY 05/04/20   [name], MD  ACCU-CHEK SOFTCLIX LANCETS lancets USE  TO  TEST TWICE DAILY 07/17/18   [name], MD  Alcohol Swabs (B-D SINGLE USE SWABS REGULAR) PADS  08/03/17   [provider]  benazepril (LOTENSIN) 40 MG tablet TAKE 1 TABLET EVERY DAY 04/20/20   [name], MD  ferrous sulfate 325 (  65 FE) MG tablet Take 1 tablet (325 mg total) by mouth 2 (two) times daily with a meal. 01/02/19   Regalado, Belkys A, MD  hydrochlorothiazide (HYDRODIURIL) 25 MG tablet Take 1 tablet by mouth once daily 03/09/20   [name], MD  insulin NPH-regular Human (NOVOLIN 70/30) (70-30) 100 UNIT/ML injection Inject 12 units into the skin each morning and 6 units into the skin each evening. 01/02/19   Regalado, Belkys A, MD  Insulin Pen Needle (BD PEN NEEDLE NANO U/F) 32G X 4 MM MISC USE TWICE DAILY. DX: E11.9 01/05/17   [name], MD  Insulin Syringe-Needle U-100 (MONOJECT INS SYR 1CC/25G) 25G X 5/8" 1 ML MISC USE 1  TWICE DAILY AS NEEDED FOR  RELION  INSULIN  70/30 03/25/20   [name], MD  lovastatin (MEVACOR) 40 MG tablet TAKE 1 TABLET AT BEDTIME. 04/20/20   [name], MD  metFORMIN (GLUCOPHAGE) 500 MG tablet TAKE 1 TABLET TWICE DAILY WITH A MEAL 04/20/20   [name], MD  pantoprazole (PROTONIX) 40 MG tablet Take 1 tablet (40 mg total) by mouth daily at 6 (six) AM. 01/03/19   Regalado, Belkys A, MD  tamsulosin (FLOMAX) 0.4 MG CAPS capsule Take 1 capsule (0.4 mg total) by mouth daily. 01/30/19   [name], MD  vitamin B-12 (CYANOCOBALAMIN) 100 MCG tablet Take 50 mcg by mouth daily.      [provider]  vitamin C (ASCORBIC ACID) 500 MG tablet Take 500 mg by mouth daily.      [provider]    Allergies    Patient has no known allergies.  Review of Systems   Review of Systems  Constitutional: Negative for chills and fever.  HENT: Positive for nosebleeds. Negative for congestion, facial swelling and trouble swallowing.   Eyes: Negative for pain.  Respiratory: Negative for cough and shortness of breath.   Cardiovascular: Negative for chest pain.  Gastrointestinal: Negative for abdominal pain, constipation, diarrhea, nausea and vomiting.  Genitourinary: Negative for enuresis, hematuria and scrotal swelling.  Musculoskeletal: Negative for back pain and myalgias.  Skin: Negative for rash.  Neurological: Negative for dizziness, weakness, light-headedness and headaches.  Hematological: Does not bruise/bleed easily.    Physical Exam Updated Vital Signs BP (!) 175/73 (BP Location: Right Arm)   Pulse 70   Temp 97.9 F (36.6 C) (Oral)   Resp 18   SpO2 96%   Physical Exam Vitals and nursing note reviewed.  Constitutional:      General: He is not in acute distress.    Appearance: He is not ill-appearing.  HENT:     Head: Normocephalic and atraumatic.      Nose: No congestion or rhinorrhea.     Comments: Nose was visualized, it was not actively bleeding on examination, turbinates were nonerythematous, not swollen, there is no trauma noted within the nasal canal, both were patent breathing without difficulty.    Mouth/Throat:     Mouth: Mucous membranes are moist.     Pharynx: Oropharynx is clear.  Eyes:     General: No scleral icterus. Cardiovascular:     Rate and Rhythm: Normal rate and regular rhythm.     Pulses: Normal pulses.     Heart sounds: No murmur heard.  No friction rub. No gallop.   Pulmonary:     Effort: No respiratory distress.     Breath sounds: No wheezing, rhonchi or rales.  Abdominal:     General: There  is no distension.     Palpations: Abdomen is soft.     Tenderness: There is no abdominal tenderness. There is no guarding.     Hernia: A hernia is present.     Comments: Patient had a ventral hernia that was reducible, abdomen was nondistended, normoactive bowel sounds, nontender to palpation, no signs of acute abdomen.  Musculoskeletal:        General: No swelling.     Right lower leg: Edema present.     Left lower leg: Edema present.     Comments: Patient had 2+ pedal edema up to the shins.  No ulcers or skin breakdown noted, no gross abnormalities noted.  He had good pedal pulses, no signs of hypoperfusion.  Skin:    General: Skin is warm and dry.     Capillary Refill: Capillary refill takes less than 2 seconds.     Findings: No rash.  Neurological:     Mental Status: He is alert.  Psychiatric:        Mood and Affect: Mood normal.     ED Results / Procedures / Treatments   Labs (all labs ordered are listed, but only abnormal results are displayed) Labs Reviewed - No data to display  EKG None  Radiology No results found.  Procedures Procedures (including critical care time)  Medications Ordered in ED Medications  oxymetazoline (AFRIN) 0.05 % nasal spray 1 spray (has no administration in time  range)    ED Course  I have reviewed the triage vital signs and the nursing notes.  Pertinent labs & imaging results that were available during my care of the patient were reviewed by me and considered in my medical decision making (see chart for details).    MDM Rules/Calculators/A&P                          I have personally reviewed all imaging, labs and have interpreted them.  I have low suspicion that patient is suffering from anemia as he denies chest pain, shortness of breath, dizziness, fatigue, physical exam was benign he was not pale, lung sounds are clear bilaterally, able to move upper and lower extremity without difficulty.  Unlikely patient suffered any trauma to his head or nose as exam did not show battle signs, raccoon eyes, no crepitus felt upon palpation, internal nares were visualized no trauma noted not actively bleeding.  Low suspicion for systemic infection as patient is nontoxic-appearing, vital signs reassuring, he has no complaints at this time.  Likely patient had a spontaneous bleed in his nose and was able to control it with pressure.  Due to patient's nontoxic appearance, stable vital signs, reassuring physical exam further lab work and imaging were not warranted.  Patient appears to be resting comfortably in bed showing no acute signs stress.  Vital signs have remained stable does not meet criteria to be admitted to the hospital.  Likely patient suffered spontaneous bleeding in his nose and was controlled with pressure.  Recommend if bleeding recurs to apply pressure and use Afrin.  Patient was discussed with attending who agrees with assessment and plan.  Patient was given at home care as well as strict return precautions.  Patient's niece who was at bedside was also explained the plan and she agrees.  Patient verbalized that he understood and agreed with said plan. Final Clinical Impression(s) / ED Diagnoses Final diagnoses:  Epistaxis    Rx / DC Orders ED  Discharge  Orders    None       Aron Baba 05/18/20 0981    Gareth Morgan, MD 05/18/20 2250

## 2020-05-21 ENCOUNTER — Telehealth: Payer: Self-pay

## 2020-05-21 NOTE — Telephone Encounter (Signed)
Pt was in ED on Monday for a nose bleed. He was told he needed a follow up with Dr. Yong Channel. There are no openings on the schedule until mid October. Please advise

## 2020-05-21 NOTE — Telephone Encounter (Signed)
Patient is scheduled for Tuesday at 920 am.

## 2020-05-21 NOTE — Telephone Encounter (Signed)
Ok to use same day?

## 2020-05-25 ENCOUNTER — Telehealth: Payer: Self-pay | Admitting: Family Medicine

## 2020-05-25 NOTE — Progress Notes (Signed)
  Chronic Care Management   Outreach Note  05/25/2020 Name: MACEN JOSLIN MRN: [id number] DOB: 10-28-1920  Referred by: [name], MD Reason for referral : Chronic Care Management (INITIAL CCM OUTREACH)   An unsuccessful telephone outreach was attempted today. The patient was referred to the pharmacist for assistance with care management and care coordination.   Follow Up Plan:   Earney [place] Upstream Scheduler

## 2020-05-26 ENCOUNTER — Ambulatory Visit (INDEPENDENT_AMBULATORY_CARE_PROVIDER_SITE_OTHER): Payer: Medicare HMO | Admitting: Family Medicine

## 2020-05-26 ENCOUNTER — Encounter: Payer: Self-pay | Admitting: Family Medicine

## 2020-05-26 ENCOUNTER — Other Ambulatory Visit: Payer: Self-pay

## 2020-05-26 VITALS — BP 136/72 | HR 82 | Temp 98.6°F | Ht 68.0 in | Wt 208.0 lb

## 2020-05-26 DIAGNOSIS — I10 Essential (primary) hypertension: Secondary | ICD-10-CM | POA: Diagnosis not present

## 2020-05-26 DIAGNOSIS — Z794 Long term (current) use of insulin: Secondary | ICD-10-CM

## 2020-05-26 DIAGNOSIS — E1159 Type 2 diabetes mellitus with other circulatory complications: Secondary | ICD-10-CM

## 2020-05-26 DIAGNOSIS — E1169 Type 2 diabetes mellitus with other specified complication: Secondary | ICD-10-CM

## 2020-05-26 DIAGNOSIS — R040 Epistaxis: Secondary | ICD-10-CM

## 2020-05-26 DIAGNOSIS — D500 Iron deficiency anemia secondary to blood loss (chronic): Secondary | ICD-10-CM

## 2020-05-26 DIAGNOSIS — E785 Hyperlipidemia, unspecified: Secondary | ICD-10-CM

## 2020-05-26 DIAGNOSIS — I152 Hypertension secondary to endocrine disorders: Secondary | ICD-10-CM

## 2020-05-26 DIAGNOSIS — I2510 Atherosclerotic heart disease of native coronary artery without angina pectoris: Secondary | ICD-10-CM | POA: Diagnosis not present

## 2020-05-26 DIAGNOSIS — C61 Malignant neoplasm of prostate: Secondary | ICD-10-CM

## 2020-05-26 NOTE — Patient Instructions (Addendum)
Health Maintenance Due  Topic Date Due  . FOOT EXAM  08/14/2019  . HEMOGLOBIN A1C will do today  12/13/2019  . INFLUENZA VACCINE do not have in office yet  05/24/2020   Nose bleed: Happy you are doing better. If you get nose bleed that continues after trying to stop with Dr. Ronney Lion instructions  make sure you get looked at. We are going to send referral to ear nose and throat to see if they can see what the reason for the nose bleeds are.   I would hold for 15 minutes consecutively (do not remove your fingers to see if bleding) to start and if still bleeding after that would do 30 minutes. If still bleeding after that do 45 minutes- and ER if still bleeding after that  Restart Asprin as long as no more nose bleeds.   Diabetes: continue medications we will check to day to see how your blood sugar is doing.    Blood pressure: thank you for check your blood pressure at home. IT looks good in office today. If you have readings that stay over 140/90 let our office know.   You may want to look into life alert or something similar. It would be helpful if you have a fall to get help.   If you have any questions or concerns before your next scheduled appointment please give our office a call.   We will call you within two weeks about your referral to Cardiology and ENT. If you do not hear within 3 weeks, give [place] a call.     Recommended follow up: No follow-ups on file.

## 2020-05-26 NOTE — Progress Notes (Signed)
Phone [phone] In person visit   Subjective:   [name] is a 86 y.o. year old very pleasant male patient who presents for/with See problem oriented charting Chief Complaint  Patient presents with  . Epistaxis    seen in ED    This visit occurred during the SARS-CoV-2 public health emergency.  Safety protocols were in place, including screening questions prior to the visit, additional usage of staff PPE, and extensive cleaning of exam room while observing appropriate contact time as indicated for disinfecting solutions.   Past Medical History-  Patient Active Problem List   Diagnosis Date Noted  . Epistaxis, recurrent 12/31/2018    Priority: High  . Diastolic dysfunction 39/12/90    Priority: High  . Prostate cancer ([place]) 08/21/2008    Priority: High  . Diabetes mellitus type II, controlled ([place]) 06/04/2007    Priority: High  . CAD (coronary artery disease) 06/04/2007    Priority: High  . Insomnia 07/06/2017    Priority: Medium  . Hyperlipidemia associated with type 2 diabetes mellitus ([place]) 06/04/2007    Priority: Medium  . Hypertension associated with diabetes ([place]) 06/04/2007    Priority: Medium  . Constipation 06/16/2010    Priority: Low  . GERD 08/04/2009    Priority: Low  . Umbilical hernia 33/00/7622    Priority: Low  . Hiatal hernia 02/13/2008    Priority: Low  . DEGENERATIVE DISC DISEASE, CERVICAL SPINE 02/13/2008    Priority: Low  . Hematemesis 01/01/2019  . Cellulitis of left eyelid 01/01/2019  . Heme positive stool     Medications- reviewed and updated Current Outpatient Medications  Medication Sig Dispense Refill  . ACCU-CHEK AVIVA PLUS test strip TEST BLOOD SUGAR TWICE DAILY 200 strip 0  . ACCU-CHEK SOFTCLIX LANCETS lancets USE  TO  TEST TWICE DAILY 200 each 3  . Alcohol Swabs (B-D SINGLE USE SWABS REGULAR) PADS     . benazepril (LOTENSIN) 40 MG tablet TAKE 1 TABLET EVERY DAY 90 tablet 0  . hydrochlorothiazide (HYDRODIURIL) 25 MG  tablet Take 1 tablet by mouth once daily 90 tablet 0  . insulin NPH-regular Human (NOVOLIN 70/30) (70-30) 100 UNIT/ML injection Inject 12 units into the skin each morning and 6 units into the skin each evening. 10 mL 5  . Insulin Pen Needle (BD PEN NEEDLE NANO U/F) 32G X 4 MM MISC USE TWICE DAILY. DX: E11.9 90 each 11  . Insulin Syringe-Needle U-100 (MONOJECT INS SYR 1CC/25G) 25G X 5/8" 1 ML MISC USE 1  TWICE DAILY AS NEEDED FOR  RELION  INSULIN  70/30 100 each 0  . lovastatin (MEVACOR) 40 MG tablet TAKE 1 TABLET AT BEDTIME. 90 tablet 0  . metFORMIN (GLUCOPHAGE) 500 MG tablet TAKE 1 TABLET TWICE DAILY WITH A MEAL 180 tablet 0  . pantoprazole (PROTONIX) 40 MG tablet Take 1 tablet (40 mg total) by mouth daily at 6 (six) AM. 30 tablet 0  . tamsulosin (FLOMAX) 0.4 MG CAPS capsule Take 1 capsule (0.4 mg total) by mouth daily. 30 capsule 0  . vitamin B-12 (CYANOCOBALAMIN) 100 MCG tablet Take 50 mcg by mouth daily.      . vitamin C (ASCORBIC ACID) 500 MG tablet Take 500 mg by mouth daily.       No current facility-administered medications for this visit.     Objective:  BP 136/72   Pulse 82   Temp 98.6 F (37 C) (Temporal)   Ht 5\' 8"  (1.727 m)   Wt 208 lb (  94.3 kg)   SpO2 100%   BMI 31.63 kg/m  Gen: NAD, resting comfortably Did not do his exam today-formal referral to ENT placed instead CV: RRR no murmurs rubs or gallops Lungs: CTAB no crackles, wheeze, rhonchi Ext: no edema Skin: warm, dry Neuro: Death    Assessment and Plan   #Emergency room follow-up for epistaxis S: Patient presented on July 25 with a nosebleed from both nostrils-he placed a cotton ball in both nostrils but had to change the cottonball several times.  Has had these in the past but typically they would stop other than did have a severe nosebleed March 2020 leading to hospitalization-with severe note there was concern for GI bleed no known trauma.  Emergency room PA recommended pressure and use of Afrin in the future-I  discussed with patient my concern about Afrin raising his blood pressure.  No blood work was completed as he was nontoxic-appearing  Patient does take not take iron related to prior possible GI bleed which may have ultimately been related to epistaxis  A/P: Const holding pressure if he has recurrent nosebleeds-I am not a big fan of Afrin considering blood pressure in the emergency room was over 170 during time of bleed.  I would rather him be supervised if Afrin was used.  See after visit summary plan for holding bridge of nose-would avoid cotton balls. -Since this is the second trip to the hospital in the last 2 years related nosebleeds-we will go ahead and refer to ENT -Since nosebleeds have not recurred and he takes his aspirin intermittently and he has coronary artery disease-asked him to take his aspirin consistently-we may have to reconsider if recurrent issues even with ENT help -Retry iron deficiency not currently on iron-we will check iron stores as well as CBC  # Diabetes S: Medication:NPH insulin 12 units in the morning and 6 units in the evening, Metformin 500 mg twice daily CBGs-did not report any lows  A/P: I would consider goal of 8 reasonable-update A1c with labs today-likely continue current medications   #hyperlipidemia/CAD with history of CABG S: Medication: Compliant with statin-lovastatin 40 mg and aspirin  Patient denies symptoms such as chest pain or shortness of breath.    A/P: For coronary artery disease-patient is asymptomatic but has not seen cardiology in several years-opted to refer back.  He lives alone I also asked him to consider a life alert type system   For hyperlipidemia-needs updated lipid panel-ordered today  #hypertension S: medication: Benazepril 40 mg Home readings #s: 129/82 this morning A/P: Poor control on initial check-was even more significantly increased in emergency room.  Well-controlled on repeat-continue benazepril alone for now   #Prostate  cancer-chronically elevated PSA-patient self caths 2-3 times a day.  Refused treatment in 2008 when discovered.  Recommended follow up: 7-month follow-up recommended or sooner if needed Future Appointments  Date Time Provider [place]  06/17/2020 10:30 AM [facility] CCM PHARMACIST [facility]  10/02/2020 11:20 AM Yong Channel Brayton Mars, MD [facility]    Lab/Order associations:   ICD-10-CM   1. Controlled type 2 diabetes mellitus with other circulatory complication, with long-term current use of insulin (HCC)  E11.59 CBC with Differential/Platelet   Z79.4 Hemoglobin A1c    Comprehensive metabolic panel    Lipid panel  2. Coronary artery disease involving native coronary artery of native heart without angina pectoris  I25.10 Ambulatory referral to Cardiology  3. Epistaxis, recurrent  R04.0 Ambulatory referral to ENT  4. Anemia, blood loss  D50.0  Iron, TIBC and Ferritin Panel  5. Hypertension associated with diabetes ([place])  E11.59    I10   6. Prostate cancer ([place])  C61   7. Hyperlipidemia associated with type 2 diabetes mellitus ([place])  E11.69    E78.5    Return precautions advised.  [name], MD

## 2020-05-27 LAB — IRON,TIBC AND FERRITIN PANEL
%SAT: 21 % (calc) (ref 20–48)
Ferritin: 10 ng/mL — ABNORMAL LOW (ref 24–380)
Iron: 60 ug/dL (ref 50–180)
TIBC: 284 mcg/dL (calc) (ref 250–425)

## 2020-05-27 LAB — CBC WITH DIFFERENTIAL/PLATELET
Absolute Monocytes: 743 cells/uL (ref 200–950)
Basophils Absolute: 32 cells/uL (ref 0–200)
Basophils Relative: 0.4 %
Eosinophils Absolute: 134 cells/uL (ref 15–500)
Eosinophils Relative: 1.7 %
HCT: 33 % — ABNORMAL LOW (ref 38.5–50.0)
Hemoglobin: 10.5 g/dL — ABNORMAL LOW (ref 13.2–17.1)
Lymphs Abs: 2141 cells/uL (ref 850–3900)
MCH: 30.3 pg (ref 27.0–33.0)
MCHC: 31.8 g/dL — ABNORMAL LOW (ref 32.0–36.0)
MCV: 95.1 fL (ref 80.0–100.0)
MPV: 10.4 fL (ref 7.5–12.5)
Monocytes Relative: 9.4 %
Neutro Abs: 4851 cells/uL (ref 1500–7800)
Neutrophils Relative %: 61.4 %
Platelets: 399 10*3/uL (ref 140–400)
RBC: 3.47 10*6/uL — ABNORMAL LOW (ref 4.20–5.80)
RDW: 13.2 % (ref 11.0–15.0)
Total Lymphocyte: 27.1 %
WBC: 7.9 10*3/uL (ref 3.8–10.8)

## 2020-05-27 LAB — COMPREHENSIVE METABOLIC PANEL
AG Ratio: 1.2 (calc) (ref 1.0–2.5)
ALT: 6 U/L — ABNORMAL LOW (ref 9–46)
AST: 11 U/L (ref 10–35)
Albumin: 3.7 g/dL (ref 3.6–5.1)
Alkaline phosphatase (APISO): 50 U/L (ref 35–144)
BUN/Creatinine Ratio: 15 (calc) (ref 6–22)
BUN: 19 mg/dL (ref 7–25)
CO2: 27 mmol/L (ref 20–32)
Calcium: 9.5 mg/dL (ref 8.6–10.3)
Chloride: 106 mmol/L (ref 98–110)
Creat: 1.31 mg/dL — ABNORMAL HIGH (ref 0.70–1.11)
Globulin: 3 g/dL (calc) (ref 1.9–3.7)
Glucose, Bld: 126 mg/dL — ABNORMAL HIGH (ref 65–99)
Potassium: 4.9 mmol/L (ref 3.5–5.3)
Sodium: 139 mmol/L (ref 135–146)
Total Bilirubin: 0.5 mg/dL (ref 0.2–1.2)
Total Protein: 6.7 g/dL (ref 6.1–8.1)

## 2020-05-27 LAB — HEMOGLOBIN A1C
Hgb A1c MFr Bld: 7.4 % of total Hgb — ABNORMAL HIGH (ref <5.7)
Mean Plasma Glucose: 166 (calc)
eAG (mmol/L): 9.2 (calc)

## 2020-05-27 LAB — LIPID PANEL
Cholesterol: 153 mg/dL (ref <200)
HDL: 46 mg/dL (ref >=40)
LDL Cholesterol (Calc): 84 mg/dL (calc)
Non-HDL Cholesterol (Calc): 107 mg/dL (calc) (ref <130)
Total CHOL/HDL Ratio: 3.3 (calc) (ref <5.0)
Triglycerides: 131 mg/dL (ref <150)

## 2020-06-10 ENCOUNTER — Telehealth: Payer: Self-pay

## 2020-06-10 NOTE — Telephone Encounter (Signed)
.. °  LAST APPOINTMENT DATE: 05/26/2020   NEXT APPOINTMENT DATE:@8 /25/2021  MEDICATION:lovastatin (MEVACOR) 40 MG tablet    PHARMACY: [facility]

## 2020-06-11 ENCOUNTER — Other Ambulatory Visit: Payer: Self-pay

## 2020-06-11 MED ORDER — LOVASTATIN 40 MG PO TABS: 40.0000 mg | ORAL_TABLET | Freq: Every day | ORAL | 3 refills | Status: DC

## 2020-06-11 NOTE — Telephone Encounter (Signed)
Refill sent in

## 2020-06-15 ENCOUNTER — Telehealth: Payer: Self-pay | Admitting: Family Medicine

## 2020-06-15 ENCOUNTER — Other Ambulatory Visit: Payer: Self-pay

## 2020-06-15 MED ORDER — ROSUVASTATIN CALCIUM 10 MG PO TABS: 10.0000 mg | ORAL_TABLET | Freq: Every day | ORAL | 3 refills | Status: AC

## 2020-06-15 NOTE — Telephone Encounter (Signed)
Please ensure the above are on pt DPR, if they are, would be best to set up virtual visit so they can discuss with Dr. Yong Channel.

## 2020-06-15 NOTE — Telephone Encounter (Signed)
Patients family is concerned for well being of patient. [name] patients niece states her wears clothes for days, doesn't wash clothes or shower for days. Patient also is driving still and continues to drive by his church almost twice daily but doesn't get out of his car and drives away when someone approaches him. So family is just concerned regarding is mental state, and requested a call back to talk about patient and see if we have any recommendations to help him.

## 2020-06-15 NOTE — Telephone Encounter (Signed)
[name] is on DPR I double triple checked. Patient doesn't want to have an appointment loretta stated he is not happy about him coming in when he feels he doesn't need to.

## 2020-06-16 NOTE — Progress Notes (Signed)
Chronic Care Management Pharmacy  Name: [name]  MRN: [id number] DOB: 09-14-1921  Chief Complaint/ HPI  [name],  86 y.o., male presents for their Initial CCM visit with the clinical pharmacist via telephone due to COVID-19 Pandemic. Patient is hard of hearing, not accompanied by anyone.   Reports to be doing great, no complaints about medications or health. Is currently riding stationary bike at least once per week ~30 minutes.   PCP : [name], MD  Chronic conditions include:  Encounter Diagnoses  Name Primary?  . Controlled type 2 diabetes mellitus with other circulatory complication, with long-term current use of insulin ([place]) Yes  . Hyperlipidemia associated with type 2 diabetes mellitus ([place])   . Hypertension associated with diabetes [facility])     Patient Active Problem List   Diagnosis Date Noted  . Hematemesis 01/01/2019  . Cellulitis of left eyelid 01/01/2019  . Heme positive stool   . Epistaxis, recurrent 12/31/2018  . Insomnia 07/06/2017  . Diastolic dysfunction 61/95/0932  . Constipation 06/16/2010  . GERD 08/04/2009  . Prostate cancer ([place]) 08/21/2008  . Umbilical hernia 67/09/4579  . Hiatal hernia 02/13/2008  . DEGENERATIVE DISC DISEASE, CERVICAL SPINE 02/13/2008  . Diabetes mellitus type II, controlled ([place]) 06/04/2007  . Hyperlipidemia associated with type 2 diabetes mellitus ([place]) 06/04/2007  . Hypertension associated with diabetes ([place]) 06/04/2007  . CAD (coronary artery disease) 06/04/2007   Past Surgical History:  Procedure Laterality Date  . CORONARY ARTERY BYPASS GRAFT     Family History  Problem Relation Age of Onset  . Heart disease Mother   . Diabetes Mother    No Known Allergies Outpatient Encounter Medications as of 06/17/2020  Medication Sig  . ACCU-CHEK AVIVA PLUS test strip TEST BLOOD SUGAR TWICE DAILY  . ACCU-CHEK SOFTCLIX LANCETS lancets USE  TO  TEST TWICE DAILY  . Alcohol Swabs (B-D SINGLE USE SWABS  REGULAR) PADS   . benazepril (LOTENSIN) 40 MG tablet TAKE 1 TABLET EVERY DAY  . insulin NPH-regular Human (NOVOLIN 70/30) (70-30) 100 UNIT/ML injection Inject 12 units into the skin each morning and 6 units into the skin each evening.  . Insulin Pen Needle (BD PEN NEEDLE NANO U/F) 32G X 4 MM MISC USE TWICE DAILY. DX: E11.9  . Insulin Syringe-Needle U-100 (MONOJECT INS SYR 1CC/25G) 25G X 5/8" 1 ML MISC USE 1  TWICE DAILY AS NEEDED FOR  RELION  INSULIN  70/30  . metFORMIN (GLUCOPHAGE) 500 MG tablet TAKE 1 TABLET TWICE DAILY WITH A MEAL  . pantoprazole (PROTONIX) 40 MG tablet Take 1 tablet (40 mg total) by mouth daily at 6 (six) AM.  . rosuvastatin (CRESTOR) 10 MG tablet Take 1 tablet (10 mg total) by mouth daily.  . tamsulosin (FLOMAX) 0.4 MG CAPS capsule Take 1 capsule (0.4 mg total) by mouth daily.  . vitamin B-12 (CYANOCOBALAMIN) 100 MCG tablet Take 50 mcg by mouth daily.    . vitamin C (ASCORBIC ACID) 500 MG tablet Take 500 mg by mouth daily.    . hydrochlorothiazide (HYDRODIURIL) 25 MG tablet Take 1 tablet by mouth once daily   No facility-administered encounter medications on file as of 06/17/2020.   Patient Care Team    Relationship Specialty Notifications Start End  [name], MD PCP - General Family Medicine  08/21/17   Buford Dresser, MD PCP - Cardiology Cardiology Admissions 06/26/19   Madelin Rear, [facility] Pharmacist Pharmacist  05/25/20    Comment: [phone]   Current Diagnosis/Assessment:  Goals Addressed            This Visit's Progress   . PharmD Care Plan       CARE PLAN ENTRY (see longitudinal plan of care for additional care plan information)  Current Barriers:  . Chronic Disease Management support, education, and care coordination needs related to Hypertension, Hyperlipidemia, and Diabetes   Hypertension BP Readings from Last 3 Encounters:  05/26/20 136/72  05/18/20 (!) 175/73  04/26/20 138/75   . Pharmacist Clinical Goal(s): o Over the next 180  days, patient will work with PharmD and providers to maintain BP goal <140/90 . Current regimen:  o Benazepril 40 mg once daily . Interventions: o Counseled on medications, home monitoring  . Patient self care activities - Over the next 180 days, patient will: o Check BP at least once every 1-2 weeks, document, and provide at future appointments o Ensure daily salt intake < 2300 mg/day  Hyperlipidemia Lab Results  Component Value Date/Time   LDLCALC 84 05/26/2020 09:59 AM   LDLDIRECT 72.0 03/23/2017 11:05 AM   . Pharmacist Clinical Goal(s): o Over the next 180 days, patient will work with PharmD and providers to maintain LDL goal < 100 . Current regimen:  o Rosuvastatin 10 mg once daily . Interventions: o Continue current management . Patient self care activities - Over the next 180 days, patient will: o Continue current management  Diabetes Lab Results  Component Value Date/Time   HGBA1C 7.4 (H) 05/26/2020 09:59 AM   HGBA1C 7.4 (H) 06/12/2019 09:29 AM   . Pharmacist Clinical Goal(s): o Over the next 180 days, patient will work with PharmD and providers to maintain A1c goal <8% . Current regimen:  . Novolin 70/30 12 units into the skin each morning and 6 units into the skin each evening  . Metformin 500 mg twice daily  . Interventions: o Discussed low BG  . Patient self care activities - Over the next 180 days, patient will: o Check blood sugar as directed, document, and provide at future appointments o Contact provider with any episodes of hypoglycemia  Medication management . Pharmacist Clinical Goal(s): o Over the next 180 days, patient will work with PharmD and providers to maintain optimal medication adherence . Current pharmacy: [facility] . Interventions o Comprehensive medication review performed. o Continue current medication management strategy . Patient self care activities - Over the next 180 days, patient will: o Take medications as prescribed o Report any  questions or concerns to PharmD and/or provider(s) Initial goal documentation.      Hypertension   BP goal <140/90  BP Readings from Last 3 Encounters:  05/26/20 136/72  05/18/20 (!) 175/73  04/26/20 138/75   Patient checks BP at home infrequently Patient home BP readings are ranging: n/a  Patient is currently at goal on the following medications:  . Lotensin 40 mg once daily   We discussed diet and exercise extensively.  Plan  Continue current medications and control with diet and exercise.   Diabetes   A1c goal < 8%  Lab Results  Component Value Date/Time   HGBA1C 7.4 (H) 05/26/2020 09:59 AM   HGBA1C 7.4 (H) 06/12/2019 09:29 AM   MICROALBUR 0.7 06/05/2007 09:57 AM    Checking BG: Daily. Recent FBG readings: specific readings not provided. Pt reports normal.   Previous medications: Patient is currently controlled on the following medications:  . Novolin 70/30 12 units into the skin each morning and 6 units into the skin each evening  .  Metformin 500 mg twice daily   We discussed: diet and exercise extensively and how to recognize and treat signs of hypoglycemia.  Plan  Continue current medications and control with diet and exercise.  Hyperlipidemia   LDL goal < 100  Lipid Panel     Component Value Date/Time   CHOL 153 05/26/2020 0959   TRIG 131 05/26/2020 0959   HDL 46 05/26/2020 0959   LDLCALC 84 05/26/2020 0959   LDLDIRECT 72.0 03/23/2017 1105    Hepatic Function Latest Ref Rng & Units 05/26/2020 07/29/2019 06/12/2019  Total Protein 6.1 - 8.1 g/dL 6.7 6.6 6.8  Albumin 3.5 - 5.2 g/dL - 3.8 4.0  AST 10 - 35 U/L _0 ALT 9 - 46 U/L 6(L) 7 7  Alk Phosphatase 39 - 117 U/L - 46 45  Total Bilirubin 0.2 - 1.2 mg/dL 0.5 0.4 0.4  Bilirubin, Direct 0.0 - 0.3 mg/dL - - -    The ASCVD Risk score (Newton., et al., 2013) failed to calculate for the following reasons:   The 2013 ASCVD risk score is only valid for ages 32 to 90   Patient is currently at  goal on the following medications:  . Crestor 10 mg daily  We discussed:  diet and exercise extensively. Counseled on Crestor 10 mg.  Plan  Continue current medications and control with diet and exercise.  GERD   Patient denies recent acid reflux.  Currently controlled on: . Pantoprazole 40 mg once daily at 6 AM  We discussed: Avoidance of potential triggers.  Plan   Continue current medications and control with diet and exercise.  Vaccines   Immunization History  Administered Date(s) Administered  . Fluad Quad(high Dose 65+) 06/12/2019  . Influenza Whole 07/27/2007, 08/27/2009, 06/14/2010  . Influenza, High Dose Seasonal PF 07/08/2016, 07/21/2017, 08/13/2018  . Influenza,inj,Quad PF,6+ Mos 06/23/2015  . Influenza-Unspecified 07/10/2013, 07/17/2014  . PFIZER SARS-COV-2 Vaccination 01/17/2020, 02/11/2020  . Pneumococcal Conjugate-13 02/20/2015  . Pneumococcal Polysaccharide-23 10/24/2005  . Td 10/24/2005  . Tdap 08/13/2018   Reviewed and discussed patient's vaccination history.  Due for Shingrix.  Plan  Recommended patient receive Shingrix vaccine in pharmacy.   Medication Management Coordination   Receives prescription medications from:  [place], [place] [address] Phone: [phone] Fax: [phone]  [place] ([place]), [name] [place] ([place]) [place] [address] Phone: [phone] Fax: [phone]   Denies any issues with current medication management.   Plan  Continue current medication management strategy. ___________________________ SDOH (Social Determinants of Health) assessments performed: Yes.  Future Appointments  Date Time Provider [place]  07/07/2020  8:30 AM Lorretta Harp, MD [facility] [place]  08/10/2020 11:00 AM [facility] CCM PHARMACIST [facility]  10/02/2020 11:20 AM Yong Channel  Brayton Mars, MD [facility]   Visit follow-up:  . RPH follow-up: 2 month telephone visit.  Madelin Rear, Pharm.D., BCGP Clinical Pharmacist [facility] [phone]

## 2020-06-17 ENCOUNTER — Ambulatory Visit: Payer: Medicare HMO

## 2020-06-17 DIAGNOSIS — E1169 Type 2 diabetes mellitus with other specified complication: Secondary | ICD-10-CM

## 2020-06-17 DIAGNOSIS — Z794 Long term (current) use of insulin: Secondary | ICD-10-CM

## 2020-06-17 DIAGNOSIS — E1159 Type 2 diabetes mellitus with other circulatory complications: Secondary | ICD-10-CM

## 2020-06-23 ENCOUNTER — Other Ambulatory Visit: Payer: Self-pay | Admitting: Family Medicine

## 2020-06-23 NOTE — Patient Instructions (Signed)
Please review care plan below and call me at [phone] (direct line) with any questions!  Thank you, Edyth Gunnels., Clinical Pharmacist  Goals Addressed            This Visit's Progress   . PharmD Care Plan       CARE PLAN ENTRY (see longitudinal plan of care for additional care plan information)  Current Barriers:  . Chronic Disease Management support, education, and care coordination needs related to Hypertension, Hyperlipidemia, and Diabetes   Hypertension BP Readings from Last 3 Encounters:  05/26/20 136/72  05/18/20 (!) 175/73  04/26/20 138/75   . Pharmacist Clinical Goal(s): o Over the next 180 days, patient will work with PharmD and providers to maintain BP goal <140/90 . Current regimen:  o Benazepril 40 mg once daily . Interventions: o Counseled on medications, home monitoring  . Patient self care activities - Over the next 180 days, patient will: o Check BP at least once every 1-2 weeks, document, and provide at future appointments o Ensure daily salt intake < 2300 mg/day  Hyperlipidemia Lab Results  Component Value Date/Time   LDLCALC 84 05/26/2020 09:59 AM   LDLDIRECT 72.0 03/23/2017 11:05 AM   . Pharmacist Clinical Goal(s): o Over the next 180 days, patient will work with PharmD and providers to maintain LDL goal < 100 . Current regimen:  o Rosuvastatin 10 mg once daily . Interventions: o Continue current management . Patient self care activities - Over the next 180 days, patient will: o Continue current management  Diabetes Lab Results  Component Value Date/Time   HGBA1C 7.4 (H) 05/26/2020 09:59 AM   HGBA1C 7.4 (H) 06/12/2019 09:29 AM   . Pharmacist Clinical Goal(s): o Over the next 180 days, patient will work with PharmD and providers to maintain A1c goal <8% . Current regimen:  . Novolin 70/30 12 units into the skin each morning and 6 units into the skin each evening  . Metformin 500 mg twice daily  . Interventions: o Discussed low BG   . Patient self care activities - Over the next 180 days, patient will: o Check blood sugar as directed, document, and provide at future appointments o Contact provider with any episodes of hypoglycemia  Medication management . Pharmacist Clinical Goal(s): o Over the next 180 days, patient will work with PharmD and providers to maintain optimal medication adherence . Current pharmacy: [facility] . Interventions o Comprehensive medication review performed. o Continue current medication management strategy . Patient self care activities - Over the next 180 days, patient will: o Take medications as prescribed o Report any questions or concerns to PharmD and/or provider(s) Initial goal documentation.      Mr. Cottrill was given information about Chronic Care Management services today including:  1. CCM service includes personalized support from designated clinical staff supervised by his physician, including individualized plan of care and coordination with other care providers 2. 24/7 contact phone numbers for assistance for urgent and routine care needs. 3. Standard insurance, coinsurance, copays and deductibles apply for chronic care management only during months in which we provide at least 20 minutes of these services. Most insurances cover these services at 100%, however patients may be responsible for any copay, coinsurance and/or deductible if applicable. This service may help you avoid the need for more expensive face-to-face services. 4. Only one practitioner may furnish and bill the service in a calendar month. 5. The patient may stop CCM services at any time (effective at the end of the month)  by phone call to the office staff.  Patient agreed to services and verbal consent obtained.   The patient verbalized understanding of instructions provided today and agreed to receive a mailed copy of patient instruction and/or educational materials. Telephone follow up appointment with pharmacy  team member scheduled for: See next appointment with "Care Management Staff" under "What's Next" below.   Madelin Rear, Pharm.D., BCGP Clinical Pharmacist [facility] [phone]  Hypertension, Adult High blood pressure (hypertension) is when the force of blood pumping through the arteries is too strong. The arteries are the blood vessels that carry blood from the heart throughout the body. Hypertension forces the heart to work harder to pump blood and may cause arteries to become narrow or stiff. Untreated or uncontrolled hypertension can cause a heart attack, heart failure, a stroke, kidney disease, and other problems. A blood pressure reading consists of a higher number over a lower number. Ideally, your blood pressure should be below 120/80. The first ("top") number is called the systolic pressure. It is a measure of the pressure in your arteries as your heart beats. The second ("bottom") number is called the diastolic pressure. It is a measure of the pressure in your arteries as the heart relaxes. What are the causes? The exact cause of this condition is not known. There are some conditions that result in or are related to high blood pressure. What increases the risk? Some risk factors for high blood pressure are under your control. The following factors may make you more likely to develop this condition:  Smoking.  Having type 2 diabetes mellitus, high cholesterol, or both.  Not getting enough exercise or physical activity.  Being overweight.  Having too much fat, sugar, calories, or salt (sodium) in your diet.  Drinking too much alcohol. Some risk factors for high blood pressure may be difficult or impossible to change. Some of these factors include:  Having chronic kidney disease.  Having a family history of high blood pressure.  Age. Risk increases with age.  Race. You may be at higher risk if you are African American.  Gender. Men are at higher risk than women  before age 5. After age 65, women are at higher risk than men.  Having obstructive sleep apnea.  Stress. What are the signs or symptoms? High blood pressure may not cause symptoms. Very high blood pressure (hypertensive crisis) may cause:  Headache.  Anxiety.  Shortness of breath.  Nosebleed.  Nausea and vomiting.  Vision changes.  Severe chest pain.  Seizures. How is this diagnosed? This condition is diagnosed by measuring your blood pressure while you are seated, with your arm resting on a flat surface, your legs uncrossed, and your feet flat on the floor. The cuff of the blood pressure monitor will be placed directly against the skin of your upper arm at the level of your heart. It should be measured at least twice using the same arm. Certain conditions can cause a difference in blood pressure between your right and left arms. Certain factors can cause blood pressure readings to be lower or higher than normal for a short period of time:  When your blood pressure is higher when you are in a health care provider's office than when you are at home, this is called white coat hypertension. Most people with this condition do not need medicines.  When your blood pressure is higher at home than when you are in a health care provider's office, this is called masked hypertension.  Most people with this condition may need medicines to control blood pressure. If you have a high blood pressure reading during one visit or you have normal blood pressure with other risk factors, you may be asked to:  Return on a different day to have your blood pressure checked again.  Monitor your blood pressure at home for 1 week or longer. If you are diagnosed with hypertension, you may have other blood or imaging tests to help your health care provider understand your overall risk for other conditions. How is this treated? This condition is treated by making healthy lifestyle changes, such as eating  healthy foods, exercising more, and reducing your alcohol intake. Your health care provider may prescribe medicine if lifestyle changes are not enough to get your blood pressure under control, and if:  Your systolic blood pressure is above 130.  Your diastolic blood pressure is above 80. Your personal target blood pressure may vary depending on your medical conditions, your age, and other factors. Follow these instructions at home: Eating and drinking   Eat a diet that is high in fiber and potassium, and low in sodium, added sugar, and fat. An example eating plan is called the DASH (Dietary Approaches to Stop Hypertension) diet. To eat this way: ? Eat plenty of fresh fruits and vegetables. Try to fill one half of your plate at each meal with fruits and vegetables. ? Eat whole grains, such as whole-wheat pasta, brown rice, or whole-grain bread. Fill about one fourth of your plate with whole grains. ? Eat or drink low-fat dairy products, such as skim milk or low-fat yogurt. ? Avoid fatty cuts of meat, processed or cured meats, and poultry with skin. Fill about one fourth of your plate with lean proteins, such as fish, chicken without skin, beans, eggs, or tofu. ? Avoid pre-made and processed foods. These tend to be higher in sodium, added sugar, and fat.  Reduce your daily sodium intake. Most people with hypertension should eat less than 1,500 mg of sodium a day.  Do not drink alcohol if: ? Your health care provider tells you not to drink. ? You are pregnant, may be pregnant, or are planning to become pregnant.  If you drink alcohol: ? Limit how much you use to:  0-1 drink a day for women.  0-2 drinks a day for men. ? Be aware of how much alcohol is in your drink. In the [place] one drink equals one 12 oz bottle of beer (355 mL), one 5 oz glass of wine (148 mL), or one 1 oz glass of hard liquor (44 mL). Lifestyle   Work with your health care provider to maintain a healthy body weight or  to lose weight. Ask what an ideal weight is for you.  Get at least 30 minutes of exercise most days of the week. Activities may include walking, swimming, or biking.  Include exercise to strengthen your muscles (resistance exercise), such as Pilates or lifting weights, as part of your weekly exercise routine. Try to do these types of exercises for 30 minutes at least 3 days a week.  Do not use any products that contain nicotine or tobacco, such as cigarettes, e-cigarettes, and chewing tobacco. If you need help quitting, ask your health care provider.  Monitor your blood pressure at home as told by your health care provider.  Keep all follow-up visits as told by your health care provider. This is important. Medicines  Take over-the-counter and prescription medicines only as told by  your health care provider. Follow directions carefully. Blood pressure medicines must be taken as prescribed.  Do not skip doses of blood pressure medicine. Doing this puts you at risk for problems and can make the medicine less effective.  Ask your health care provider about side effects or reactions to medicines that you should watch for. Contact a health care provider if you:  Think you are having a reaction to a medicine you are taking.  Have headaches that keep coming back (recurring).  Feel dizzy.  Have swelling in your ankles.  Have trouble with your vision. Get help right away if you:  Develop a severe headache or confusion.  Have unusual weakness or numbness.  Feel faint.  Have severe pain in your chest or abdomen.  Vomit repeatedly.  Have trouble breathing. Summary  Hypertension is when the force of blood pumping through your arteries is too strong. If this condition is not controlled, it may put you at risk for serious complications.  Your personal target blood pressure may vary depending on your medical conditions, your age, and other factors. For most people, a normal blood  pressure is less than 120/80.  Hypertension is treated with lifestyle changes, medicines, or a combination of both. Lifestyle changes include losing weight, eating a healthy, low-sodium diet, exercising more, and limiting alcohol. This information is not intended to replace advice given to you by your health care provider. Make sure you discuss any questions you have with your health care provider. Document Revised: 06/20/2018 Document Reviewed: 06/20/2018 Elsevier Patient Education  2020 [employer].

## 2020-07-07 ENCOUNTER — Ambulatory Visit: Payer: Medicare HMO | Admitting: Cardiovascular Disease

## 2020-07-13 ENCOUNTER — Other Ambulatory Visit: Payer: Self-pay | Admitting: Family Medicine

## 2020-07-28 DIAGNOSIS — C61 Malignant neoplasm of prostate: Secondary | ICD-10-CM | POA: Diagnosis not present

## 2020-08-04 DIAGNOSIS — R339 Retention of urine, unspecified: Secondary | ICD-10-CM | POA: Diagnosis not present

## 2020-08-04 DIAGNOSIS — C61 Malignant neoplasm of prostate: Secondary | ICD-10-CM | POA: Diagnosis not present

## 2020-08-07 DIAGNOSIS — R339 Retention of urine, unspecified: Secondary | ICD-10-CM | POA: Diagnosis not present

## 2020-08-10 ENCOUNTER — Telehealth: Payer: Medicare HMO

## 2020-08-10 ENCOUNTER — Encounter (HOSPITAL_COMMUNITY): Payer: Self-pay

## 2020-08-10 ENCOUNTER — Emergency Department (HOSPITAL_COMMUNITY): Payer: Medicare HMO

## 2020-08-10 ENCOUNTER — Emergency Department (HOSPITAL_COMMUNITY)
Admission: EM | Admit: 2020-08-10 | Discharge: 2020-08-10 | Disposition: A | Discharge status: Home / Self Care | Payer: Medicare HMO | Attending: Emergency Medicine | Admitting: Emergency Medicine

## 2020-08-10 ENCOUNTER — Telehealth: Payer: Self-pay

## 2020-08-10 DIAGNOSIS — Z8546 Personal history of malignant neoplasm of prostate: Secondary | ICD-10-CM | POA: Insufficient documentation

## 2020-08-10 DIAGNOSIS — Z794 Long term (current) use of insulin: Secondary | ICD-10-CM | POA: Diagnosis not present

## 2020-08-10 DIAGNOSIS — Z043 Encounter for examination and observation following other accident: Secondary | ICD-10-CM | POA: Diagnosis not present

## 2020-08-10 DIAGNOSIS — Z951 Presence of aortocoronary bypass graft: Secondary | ICD-10-CM | POA: Diagnosis not present

## 2020-08-10 DIAGNOSIS — I2510 Atherosclerotic heart disease of native coronary artery without angina pectoris: Secondary | ICD-10-CM | POA: Insufficient documentation

## 2020-08-10 DIAGNOSIS — S0003XA Contusion of scalp, initial encounter: Secondary | ICD-10-CM | POA: Insufficient documentation

## 2020-08-10 DIAGNOSIS — R0902 Hypoxemia: Secondary | ICD-10-CM | POA: Diagnosis not present

## 2020-08-10 DIAGNOSIS — E119 Type 2 diabetes mellitus without complications: Secondary | ICD-10-CM | POA: Insufficient documentation

## 2020-08-10 DIAGNOSIS — I959 Hypotension, unspecified: Secondary | ICD-10-CM | POA: Diagnosis not present

## 2020-08-10 DIAGNOSIS — I5030 Unspecified diastolic (congestive) heart failure: Secondary | ICD-10-CM | POA: Insufficient documentation

## 2020-08-10 DIAGNOSIS — I110 Hypertensive heart disease with heart failure: Secondary | ICD-10-CM | POA: Diagnosis not present

## 2020-08-10 DIAGNOSIS — Z7984 Long term (current) use of oral hypoglycemic drugs: Secondary | ICD-10-CM | POA: Diagnosis not present

## 2020-08-10 DIAGNOSIS — R42 Dizziness and giddiness: Secondary | ICD-10-CM | POA: Diagnosis not present

## 2020-08-10 DIAGNOSIS — I10 Essential (primary) hypertension: Secondary | ICD-10-CM | POA: Diagnosis not present

## 2020-08-10 DIAGNOSIS — S0990XA Unspecified injury of head, initial encounter: Secondary | ICD-10-CM | POA: Diagnosis present

## 2020-08-10 DIAGNOSIS — W010XXA Fall on same level from slipping, tripping and stumbling without subsequent striking against object, initial encounter: Secondary | ICD-10-CM | POA: Diagnosis not present

## 2020-08-10 DIAGNOSIS — Z87891 Personal history of nicotine dependence: Secondary | ICD-10-CM | POA: Diagnosis not present

## 2020-08-10 DIAGNOSIS — Z79899 Other long term (current) drug therapy: Secondary | ICD-10-CM | POA: Diagnosis not present

## 2020-08-10 DIAGNOSIS — R531 Weakness: Secondary | ICD-10-CM | POA: Diagnosis not present

## 2020-08-10 DIAGNOSIS — W19XXXA Unspecified fall, initial encounter: Secondary | ICD-10-CM

## 2020-08-10 LAB — COMPREHENSIVE METABOLIC PANEL
ALT: 12 U/L (ref 0–44)
AST: 20 U/L (ref 15–41)
Albumin: 3.6 g/dL (ref 3.5–5.0)
Alkaline Phosphatase: 39 U/L (ref 38–126)
Anion gap: 11 (ref 5–15)
BUN: 23 mg/dL (ref 8–23)
CO2: 23 mmol/L (ref 22–32)
Calcium: 8.9 mg/dL (ref 8.9–10.3)
Chloride: 105 mmol/L (ref 98–111)
Creatinine, Ser: 1.36 mg/dL — ABNORMAL HIGH (ref 0.61–1.24)
GFR, Estimated: 43 mL/min — ABNORMAL LOW (ref >60)
Glucose, Bld: 136 mg/dL — ABNORMAL HIGH (ref 70–99)
Potassium: 3.8 mmol/L (ref 3.5–5.1)
Sodium: 139 mmol/L (ref 135–145)
Total Bilirubin: 0.7 mg/dL (ref 0.3–1.2)
Total Protein: 6.9 g/dL (ref 6.5–8.1)

## 2020-08-10 LAB — CBC WITH DIFFERENTIAL/PLATELET
Abs Immature Granulocytes: 0.04 10*3/uL (ref 0.00–0.07)
Basophils Absolute: 0 10*3/uL (ref 0.0–0.1)
Basophils Relative: 0 %
Eosinophils Absolute: 0.1 10*3/uL (ref 0.0–0.5)
Eosinophils Relative: 1 %
HCT: 30.1 % — ABNORMAL LOW (ref 39.0–52.0)
Hemoglobin: 9.4 g/dL — ABNORMAL LOW (ref 13.0–17.0)
Immature Granulocytes: 0 %
Lymphocytes Relative: 11 %
Lymphs Abs: 1.2 10*3/uL (ref 0.7–4.0)
MCH: 30 pg (ref 26.0–34.0)
MCHC: 31.2 g/dL (ref 30.0–36.0)
MCV: 96.2 fL (ref 80.0–100.0)
Monocytes Absolute: 0.7 10*3/uL (ref 0.1–1.0)
Monocytes Relative: 6 %
Neutro Abs: 8.7 10*3/uL — ABNORMAL HIGH (ref 1.7–7.7)
Neutrophils Relative %: 82 %
Platelets: 434 10*3/uL — ABNORMAL HIGH (ref 150–400)
RBC: 3.13 MIL/uL — ABNORMAL LOW (ref 4.22–5.81)
RDW: 14 % (ref 11.5–15.5)
WBC: 10.7 10*3/uL — ABNORMAL HIGH (ref 4.0–10.5)
nRBC: 0.2 % (ref 0.0–0.2)

## 2020-08-10 LAB — TROPONIN I (HIGH SENSITIVITY)
Troponin I (High Sensitivity): 13 ng/L (ref <18)
Troponin I (High Sensitivity): 17 ng/L (ref <18)

## 2020-08-10 LAB — CBG MONITORING, ED: Glucose-Capillary: 134 mg/dL — ABNORMAL HIGH (ref 70–99)

## 2020-08-10 MED ORDER — SODIUM CHLORIDE 0.9 % IV BOLUS
1000.0000 mL | Freq: Once | INTRAVENOUS | Status: AC
  Administered 2020-08-10: 1000 mL via INTRAVENOUS

## 2020-08-10 NOTE — Discharge Instructions (Signed)
You have a small hematoma in the back of your head but your scans were unremarkable.  See your doctor for follow-up  Return to ER if you have worse headache, passing out, chest pain.

## 2020-08-10 NOTE — Telephone Encounter (Signed)
Called and lm for pt niece Guerry Minors tcb.

## 2020-08-10 NOTE — ED Notes (Signed)
Pt niece to drive pt home

## 2020-08-10 NOTE — Telephone Encounter (Signed)
[name]'s niece is calling in this morning, to let us know that he had a fall this morning and was taken to the hospital. When arriving they noticed his blood sugar was really high and [name] stated that he believed that he may have taken too much of his medication this morning so [name] wants to know what dose he is supposed to be taking. Also stressed a bit of concern for [name]'s health, she says that she feels like he may need to go to a home or need in home assistant as his son is not really able to take care of him.

## 2020-08-10 NOTE — ED Notes (Signed)
Pt niece: [phone]

## 2020-08-10 NOTE — Telephone Encounter (Signed)
Pt has OV scheduled with you for 10/02/20. Sooner OV needed to address this?

## 2020-08-10 NOTE — ED Notes (Signed)
Pt requesting straight catheter to void

## 2020-08-10 NOTE — Telephone Encounter (Signed)
Sugar was not low when I looked at the hospital notes-blood pressure was in the 130s  This is what he should be on for diabetes-# Diabetes Medication:NPH insulin 12 units in the morning and 6 units in the evening, Metformin 500 mg twice daily I do want to know if he is having any lows below 80 or consistent highs about 200  Can try to work him in sooner for emergency department follow-up.  We do not have a Education officer, museum on site in regards to staying at a facility-family would need to research options.  If he did come in for a visit we could look at home health assistance but many people do not have home health aide coverage which is what he would need-we could still try

## 2020-08-10 NOTE — ED Provider Notes (Signed)
[facility] DEPT Provider Note   CSN: [id number] Arrival date & time: 08/10/20  2353     History Chief Complaint  Patient presents with  . Fall    [name] is a 86 y.o. male history of CAD, diabetes, diastolic CHF, hypertension, hyperlipidemia here presenting with fall.  Patient cannot tell me how he fell.  He lives at home with his son.  He is very hard of hearing.  He states that he may have lost consciousness briefly but cannot remember.  Patient denies any hip pain or back pain.  He denies any chest pain.  The history is provided by the patient.       Past Medical History:  Diagnosis Date  . Arthritis   . CAD (coronary artery disease)    post diaphragjatic wall infarction and subsequent coronary artery bypass graft surgery in 2003, now stable  . Diabetes mellitus   . Diastolic CHF ([place]) 04/06/4314   02/2013 Echo Grade I diastoilc dysfunction. Off lasix for several months 40mg - 04/14/16. Continue to monitor  . Elevated PSA   . GERD (gastroesophageal reflux disease)   . Hemorrhoids   . Hx of adenomatous colonic polyps   . Hyperlipidemia   . Hypertension   . Prostate cancer ([place])   . Urinary retention     Patient Active Problem List   Diagnosis Date Noted  . Hematemesis 01/01/2019  . Cellulitis of left eyelid 01/01/2019  . Heme positive stool   . Epistaxis, recurrent 12/31/2018  . Insomnia 07/06/2017  . Diastolic dysfunction 40/05/6760  . Constipation 06/16/2010  . GERD 08/04/2009  . Prostate cancer ([place]) 08/21/2008  . Umbilical hernia 95/06/3266  . Hiatal hernia 02/13/2008  . DEGENERATIVE DISC DISEASE, CERVICAL SPINE 02/13/2008  . Diabetes mellitus type II, controlled ([place]) 06/04/2007  . Hyperlipidemia associated with type 2 diabetes mellitus ([place]) 06/04/2007  . Hypertension associated with diabetes ([place]) 06/04/2007  . CAD (coronary artery disease) 06/04/2007    Past Surgical History:  Procedure Laterality Date  .  CORONARY ARTERY BYPASS GRAFT         Family History  Problem Relation Age of Onset  . Heart disease Mother   . Diabetes Mother     Social History   Tobacco Use  . Smoking status: Former Smoker    Quit date: 10/25/1948    Years since quitting: 71.8  . Smokeless tobacco: Never Used  Substance Use Topics  . Alcohol use: No  . Drug use: No    Home Medications Prior to Admission medications   Medication Sig Start Date End Date Taking? Authorizing Provider  benazepril (LOTENSIN) 40 MG tablet TAKE 1 TABLET EVERY DAY Patient taking differently: Take 40 mg by mouth daily.  07/14/20  Yes [name], MD  hydrochlorothiazide (HYDRODIURIL) 25 MG tablet Take 1 tablet by mouth once daily Patient taking differently: Take 25 mg by mouth daily.  06/24/20  Yes [name], MD  insulin NPH-regular Human (NOVOLIN 70/30) (70-30) 100 UNIT/ML injection Inject 12 units into the skin each morning and 6 units into the skin each evening. 01/02/19  Yes Regalado, Belkys A, MD  metFORMIN (GLUCOPHAGE) 500 MG tablet TAKE 1 TABLET TWICE DAILY WITH MEALS Patient taking differently: Take 500 mg by mouth 2 (two) times daily with a meal.  07/14/20  Yes [name], MD  rosuvastatin (CRESTOR) 10 MG tablet Take 1 tablet (10 mg total) by mouth daily. 06/15/20  Yes [name], MD  vitamin B-12 (  CYANOCOBALAMIN) 100 MCG tablet Take 50 mcg by mouth daily.     Yes [provider]  vitamin C (ASCORBIC ACID) 500 MG tablet Take 500 mg by mouth daily.     Yes [provider]  ACCU-CHEK AVIVA PLUS test strip TEST BLOOD SUGAR TWICE DAILY 07/14/20   [name], MD  ACCU-CHEK SOFTCLIX LANCETS lancets USE  TO  TEST TWICE DAILY 07/17/18   [name], MD  Alcohol Swabs (B-D SINGLE USE SWABS REGULAR) PADS  08/03/17   [provider]  Insulin Pen Needle (BD PEN NEEDLE NANO U/F) 32G X 4 MM MISC USE TWICE DAILY. DX: E11.9 01/05/17   [name], MD  Insulin Syringe-Needle  U-100 (MONOJECT INS SYR 1CC/25G) 25G X 5/8" 1 ML MISC USE 1  TWICE DAILY AS NEEDED FOR  RELION  INSULIN  70/30 03/25/20   [name], MD  pantoprazole (PROTONIX) 40 MG tablet Take 1 tablet (40 mg total) by mouth daily at 6 (six) AM. Patient not taking: Reported on 08/10/2020 01/03/19   Regalado, Jerald Kief A, MD  sulfamethoxazole-trimethoprim (BACTRIM DS) 800-160 MG tablet Take 1 tablet by mouth 2 (two) times daily. 7 day supply 08/07/20   [provider]  tamsulosin (FLOMAX) 0.4 MG CAPS capsule Take 1 capsule (0.4 mg total) by mouth daily. Patient not taking: Reported on 08/10/2020 01/30/19   [name], MD    Allergies    Patient has no known allergies.  Review of Systems   Review of Systems  Neurological: Positive for weakness.  All other systems reviewed and are negative.   Physical Exam Updated Vital Signs BP (!) 187/90 (BP Location: Right Arm)   Pulse 76   Temp 97.7 F (36.5 C) (Oral)   Resp 18   SpO2 99%   Physical Exam Vitals and nursing note reviewed.  HENT:     Head: Normocephalic.     Comments: Small posterior scalp hematoma    Nose: Nose normal.     Mouth/Throat:     Mouth: Mucous membranes are moist.  Eyes:     Extraocular Movements: Extraocular movements intact.     Pupils: Pupils are equal, round, and reactive to light.  Cardiovascular:     Rate and Rhythm: Normal rate and regular rhythm.     Pulses: Normal pulses.     Heart sounds: Normal heart sounds.  Pulmonary:     Effort: Pulmonary effort is normal.  Abdominal:     General: Abdomen is flat.     Palpations: Abdomen is soft.  Musculoskeletal:        General: Normal range of motion.     Cervical back: Normal range of motion.  Skin:    General: Skin is warm.     Capillary Refill: Capillary refill takes less than 2 seconds.  Neurological:     General: No focal deficit present.     Mental Status: He is alert.     Comments: Demented and moving all extremities.  Psychiatric:         Mood and Affect: Mood normal.        Behavior: Behavior normal.     ED Results / Procedures / Treatments   Labs (all labs ordered are listed, but only abnormal results are displayed) Labs Reviewed  CBC WITH DIFFERENTIAL/PLATELET - Abnormal; Notable for the following components:      Result Value   WBC 10.7 (*)    RBC 3.13 (*)    Hemoglobin 9.4 (*)  HCT 30.1 (*)    Platelets 434 (*)    Neutro Abs 8.7 (*)    All other components within normal limits  COMPREHENSIVE METABOLIC PANEL - Abnormal; Notable for the following components:   Glucose, Bld 136 (*)    Creatinine, Ser 1.36 (*)    GFR, Estimated 43 (*)    All other components within normal limits  CBG MONITORING, ED - Abnormal; Notable for the following components:   Glucose-Capillary 134 (*)    All other components within normal limits  TROPONIN I (HIGH SENSITIVITY)  TROPONIN I (HIGH SENSITIVITY)    EKG EKG Interpretation  Date/Time:  Monday August 10 2020 08:59:09 EDT Ventricular Rate:  69 PR Interval:    QRS Duration: 127 QT Interval:  422 QTC Calculation: 453 R Axis:   40 Text Interpretation: Sinus rhythm Right bundle branch block Anteroseptal infarct, age indeterminate No significant change since last tracing Confirmed by Wandra Arthurs [id number]) on 08/10/2020 9:36:05 AM   Radiology DG Chest 1 View  Result Date: 08/10/2020 CLINICAL DATA:  Weakness and dizziness with fall. EXAM: CHEST  1 VIEW COMPARISON:  12/19/2019 FINDINGS: Previous median sternotomy and CABG. Aortic atherosclerotic calcification. Heart size is normal. The lungs are clear. The vascularity is normal. No acute bone finding. Chronic degenerative changes of the right shoulder including a loose body. IMPRESSION: No active disease. Previous CABG. Aortic atherosclerotic calcification. Electronically Signed   By: Nelson Chimes M.D.   On: 08/10/2020 09:08   DG Pelvis 1-2 Views  Result Date: 08/10/2020 CLINICAL DATA:  [place] today. EXAM: PELVIS - 1-2 VIEW  COMPARISON:  None. FINDINGS: There is no evidence of pelvic fracture or diastasis. No pelvic bone lesions are seen. Ordinary mild degenerative change of the hips, sacroiliac joints and lower lumbar spine. IMPRESSION: Negative. Electronically Signed   By: Nelson Chimes M.D.   On: 08/10/2020 09:09   CT Head Wo Contrast  Result Date: 08/10/2020 CLINICAL DATA:  Fall.  Weakness and dizziness. EXAM: CT HEAD WITHOUT CONTRAST CT CERVICAL SPINE WITHOUT CONTRAST TECHNIQUE: Multidetector CT imaging of the head and cervical spine was performed following the standard protocol without intravenous contrast. Multiplanar CT image reconstructions of the cervical spine were also generated. COMPARISON:  Head CT 03/14/2007 FINDINGS: CT HEAD FINDINGS Brain: Streak artifact from the patient's cochlear implant obscures portions of the lateral and posterior left cerebral hemisphere and superolateral aspect of the posterior fossa. Within this limitation, no acute infarct, intracranial hemorrhage, mass, midline shift, or extra-axial fluid collection is identified. Hypodensities in the cerebral white matter bilaterally have progressed from 2008 and are nonspecific but compatible with mild chronic small vessel ischemic disease. Mild cerebral atrophy is within normal limits for age. Vascular: Calcified atherosclerosis at the skull base. No hyperdense vessel. Skull: No fracture or suspicious osseous lesion. Sinuses/Orbits: Chronic left sphenoid sinusitis with unchanged complete sinus opacification. Left mastoidectomy and cochlear implant placement. Unremarkable orbits. Other: None. CT CERVICAL SPINE FINDINGS Alignment: Trace retrolisthesis of C3 on C4 and trace anterolisthesis of C4 on C5 and C5 on C6. Skull base and vertebrae: No acute fracture or suspicious osseous lesion. Soft tissues and spinal canal: No prevertebral fluid or swelling. No visible canal hematoma. Disc levels: Moderately advanced median C1-2 arthropathy. Diffuse cervical  disc degeneration which is most severe at C5-6 and C6-7 with interbody fusion at C5-6. Widespread advanced cervical facet arthrosis. Moderate neural foraminal stenosis at C3-4 and C4-5. Moderate spinal stenosis at C3-4 and C6-7. Upper chest: Clear lung apices. Other: Calcific atherosclerosis  at the right greater than left carotid bifurcations. IMPRESSION: 1. No evidence of acute intracranial abnormality. 2. No acute cervical spine fracture. Advanced disc and facet degeneration. Electronically Signed   By: Logan Bores M.D.   On: 08/10/2020 09:58   CT Cervical Spine Wo Contrast  Result Date: 08/10/2020 CLINICAL DATA:  Fall.  Weakness and dizziness. EXAM: CT HEAD WITHOUT CONTRAST CT CERVICAL SPINE WITHOUT CONTRAST TECHNIQUE: Multidetector CT imaging of the head and cervical spine was performed following the standard protocol without intravenous contrast. Multiplanar CT image reconstructions of the cervical spine were also generated. COMPARISON:  Head CT 03/14/2007 FINDINGS: CT HEAD FINDINGS Brain: Streak artifact from the patient's cochlear implant obscures portions of the lateral and posterior left cerebral hemisphere and superolateral aspect of the posterior fossa. Within this limitation, no acute infarct, intracranial hemorrhage, mass, midline shift, or extra-axial fluid collection is identified. Hypodensities in the cerebral white matter bilaterally have progressed from 2008 and are nonspecific but compatible with mild chronic small vessel ischemic disease. Mild cerebral atrophy is within normal limits for age. Vascular: Calcified atherosclerosis at the skull base. No hyperdense vessel. Skull: No fracture or suspicious osseous lesion. Sinuses/Orbits: Chronic left sphenoid sinusitis with unchanged complete sinus opacification. Left mastoidectomy and cochlear implant placement. Unremarkable orbits. Other: None. CT CERVICAL SPINE FINDINGS Alignment: Trace retrolisthesis of C3 on C4 and trace anterolisthesis of  C4 on C5 and C5 on C6. Skull base and vertebrae: No acute fracture or suspicious osseous lesion. Soft tissues and spinal canal: No prevertebral fluid or swelling. No visible canal hematoma. Disc levels: Moderately advanced median C1-2 arthropathy. Diffuse cervical disc degeneration which is most severe at C5-6 and C6-7 with interbody fusion at C5-6. Widespread advanced cervical facet arthrosis. Moderate neural foraminal stenosis at C3-4 and C4-5. Moderate spinal stenosis at C3-4 and C6-7. Upper chest: Clear lung apices. Other: Calcific atherosclerosis at the right greater than left carotid bifurcations. IMPRESSION: 1. No evidence of acute intracranial abnormality. 2. No acute cervical spine fracture. Advanced disc and facet degeneration. Electronically Signed   By: Logan Bores M.D.   On: 08/10/2020 09:58    Procedures Procedures (including critical care time)  Medications Ordered in ED Medications  sodium chloride 0.9 % bolus 1,000 mL (0 mLs Intravenous Stopped 08/10/20 1000)    ED Course  I have reviewed the triage vital signs and the nursing notes.  Pertinent labs & imaging results that were available during my care of the patient were reviewed by me and considered in my medical decision making (see chart for details).    MDM Rules/Calculators/A&P                         [name] is a 86 y.o. male here with fall. Patient can't tell me how he fell. Has possible scalp hematoma. Will get CT head/neck, xrays. Will get basic labs.   12:11 PM Labs and xrays and CT head/neck unremarkable.  Ambulated in the ED with no issues.  Stable for discharge.   Final Clinical Impression(s) / ED Diagnoses Final diagnoses:  None    Rx / DC Orders ED Discharge Orders    None       Drenda Freeze, MD 08/10/20 1213

## 2020-08-10 NOTE — ED Triage Notes (Signed)
Pt was found on ground by son this morning after hearing a thud. Pt took his insulin this morning. Pt was able to get up with assistance to drink some orange juice and have something to eat. Pt ambulatory on EMS arrival. Pt c/o weakness and dizziness. Pt denies pain. Pt denies blood thinners. Small contusion noted on back of head.

## 2020-08-10 NOTE — ED Notes (Signed)
Pt son phone number: [phone]

## 2020-08-10 NOTE — ED Notes (Signed)
Patient refused help with straight cath stating"he does it himself at home."  Offered help multiple times and patient refused.  He did allow me to help him get sat up for the straight cath.

## 2020-08-12 ENCOUNTER — Telehealth: Payer: Self-pay

## 2020-08-12 NOTE — Progress Notes (Signed)
Chronic Care Management Pharmacy Assistant   Name: [name]  MRN: [id number] DOB: 03-22-1921  Reason for Encounter: Disease State   PCP : [name], MD  Allergies:  No Known Allergies  Medications: Outpatient Encounter Medications as of 08/12/2020  Medication Sig Note  . ACCU-CHEK AVIVA PLUS test strip TEST BLOOD SUGAR TWICE DAILY   . ACCU-CHEK SOFTCLIX LANCETS lancets USE  TO  TEST TWICE DAILY   . Alcohol Swabs (B-D SINGLE USE SWABS REGULAR) PADS    . benazepril (LOTENSIN) 40 MG tablet TAKE 1 TABLET EVERY DAY (Patient taking differently: Take 40 mg by mouth daily. )   . hydrochlorothiazide (HYDRODIURIL) 25 MG tablet Take 1 tablet by mouth once daily (Patient taking differently: Take 25 mg by mouth daily. )   . insulin NPH-regular Human (NOVOLIN 70/30) (70-30) 100 UNIT/ML injection Inject 12 units into the skin each morning and 6 units into the skin each evening.   . Insulin Pen Needle (BD PEN NEEDLE NANO U/F) 32G X 4 MM MISC USE TWICE DAILY. DX: E11.9   . Insulin Syringe-Needle U-100 (MONOJECT INS SYR 1CC/25G) 25G X 5/8" 1 ML MISC USE 1  TWICE DAILY AS NEEDED FOR  RELION  INSULIN  70/30   . metFORMIN (GLUCOPHAGE) 500 MG tablet TAKE 1 TABLET TWICE DAILY WITH MEALS (Patient taking differently: Take 500 mg by mouth 2 (two) times daily with a meal. )   . pantoprazole (PROTONIX) 40 MG tablet Take 1 tablet (40 mg total) by mouth daily at 6 (six) AM. (Patient not taking: Reported on 08/10/2020)   . rosuvastatin (CRESTOR) 10 MG tablet Take 1 tablet (10 mg total) by mouth daily.   Marland Kitchen sulfamethoxazole-trimethoprim (BACTRIM DS) 800-160 MG tablet Take 1 tablet by mouth 2 (two) times daily. 7 day supply 08/10/2020: Pt unsure of this med, I called pharmacy & he has not picked it up yet  . tamsulosin (FLOMAX) 0.4 MG CAPS capsule Take 1 capsule (0.4 mg total) by mouth daily. (Patient not taking: Reported on 08/10/2020)   . vitamin B-12 (CYANOCOBALAMIN) 100 MCG tablet Take 50 mcg by  mouth daily.     . vitamin C (ASCORBIC ACID) 500 MG tablet Take 500 mg by mouth daily.      No facility-administered encounter medications on file as of 08/12/2020.    Current Diagnosis: Patient Active Problem List   Diagnosis Date Noted  . Hematemesis 01/01/2019  . Cellulitis of left eyelid 01/01/2019  . Heme positive stool   . Epistaxis, recurrent 12/31/2018  . Insomnia 07/06/2017  . Diastolic dysfunction 15/40/0867  . Constipation 06/16/2010  . GERD 08/04/2009  . Prostate cancer ([place]) 08/21/2008  . Umbilical hernia 61/95/0932  . Hiatal hernia 02/13/2008  . DEGENERATIVE DISC DISEASE, CERVICAL SPINE 02/13/2008  . Diabetes mellitus type II, controlled ([place]) 06/04/2007  . Hyperlipidemia associated with type 2 diabetes mellitus ([place]) 06/04/2007  . Hypertension associated with diabetes ([place]) 06/04/2007  . CAD (coronary artery disease) 06/04/2007    Reviewed chart prior to disease state call. Spoke with patient regarding BP  Recent Office Vitals: BP Readings from Last 3 Encounters:  08/10/20 (!) 159/74  05/26/20 136/72  05/18/20 (!) 175/73   Pulse Readings from Last 3 Encounters:  08/10/20 89  05/26/20 82  05/18/20 70    Wt Readings from Last 3 Encounters:  05/26/20 208 lb (94.3 kg)  12/19/19 214 lb (97.1 kg)  07/29/19 206 lb 12.8 oz (93.8 kg)     Kidney Function Lab  Results  Component Value Date/Time   CREATININE 1.36 (H) 08/10/2020 08:35 AM   CREATININE 1.31 (H) 05/26/2020 09:59 AM   CREATININE 1.32 07/29/2019 10:21 AM   CREATININE 1.24 12/13/2013 05:20 PM   GFR 60.76 07/29/2019 10:21 AM   GFRNONAA 43 (L) 08/10/2020 08:35 AM   GFRAA 45 (L) 01/02/2019 03:47 AM    BMP Latest Ref Rng & Units 08/10/2020 05/26/2020 07/29/2019  Glucose 70 - 99 mg/dL 136(H) 126(H) 155(H)  BUN 8 - 23 mg/dL 23 19 26(H)  Creatinine 0.61 - 1.24 mg/dL 1.36(H) 1.31(H) 1.32  BUN/Creat Ratio 6 - 22 (calc) - 15 -  Sodium 135 - 145 mmol/L 139 139 138  Potassium 3.5 - 5.1 mmol/L 3.8 4.9 4.9    Chloride 98 - 111 mmol/L 105 106 106  CO2 22 - 32 mmol/L 23 27 26   Calcium 8.9 - 10.3 mg/dL 8.9 9.5 9.6    . Current antihypertensive regimen:  benazepril (LOTENSIN) 40 MG tablet hydrochlorothiazide (HYDRODIURIL) 25 MG tablet . How often are you checking your Blood Pressure? daily . Current home BP readings: N/A . What recent interventions/DTPs have been made by any provider to improve Blood Pressure control since last CPP Visit: None Noted . Any recent hospitalizations or ED visits since last visit with CPP? Yes      08-10-20 ED Visit- Patient was seen in ED for a fall. Patient had possible scalp hematoma. CT head /neck xrays were obtained. Labs and xrays and CT head/neck unremarkable, patient was stable for discharge. . What diet changes have been made to improve Blood Pressure Control?  o Patient states he does not follow a diet. . What exercise is being done to improve your Blood Pressure Control?  Patient states he does not exercise due to his age.  Adherence Review: Is the patient currently on ACE/ARB medication? Yes Does the patient have >5 day gap between last estimated fill dates? No   [facility] with Patient and rescheduled follow up appointment for 09-11-20 at 8:30am.   Georgiana Shore ,[place] Pharmacist Assistant [phone]    Follow-Up:  Pharmacist Review

## 2020-08-20 ENCOUNTER — Other Ambulatory Visit: Payer: Self-pay | Admitting: Family Medicine

## 2020-09-11 ENCOUNTER — Ambulatory Visit: Payer: Medicare HMO

## 2020-09-11 DIAGNOSIS — I152 Hypertension secondary to endocrine disorders: Secondary | ICD-10-CM

## 2020-09-11 DIAGNOSIS — E1159 Type 2 diabetes mellitus with other circulatory complications: Secondary | ICD-10-CM

## 2020-09-11 DIAGNOSIS — Z794 Long term (current) use of insulin: Secondary | ICD-10-CM

## 2020-09-11 NOTE — Progress Notes (Signed)
Chronic Care Management Pharmacy  Name: [name]  MRN: [id number] DOB: 1921/09/15  Chief Complaint/ HPI  [name],  86 y.o., male presents for their Follow-Up CCM visit with the clinical pharmacist via telephone due to COVID-19 Pandemic. Patient is hard of hearing. Visit performed with pt using voice to text feature on home telephone.  PCP : [name], MD  Chronic conditions include:  Encounter Diagnoses  Name Primary?  . Controlled type 2 diabetes mellitus with other circulatory complication, with long-term current use of insulin ([place]) Yes  . Hypertension associated with diabetes ([place])    08/10/2020 (ED): fall, possible scalp hematoma. Labs/xrays/ct unremarkable. Ambulated in ED without issue.    Patient Active Problem List   Diagnosis Date Noted  . Hematemesis 01/01/2019  . Cellulitis of left eyelid 01/01/2019  . Heme positive stool   . Epistaxis, recurrent 12/31/2018  . Insomnia 07/06/2017  . Diastolic dysfunction 87/86/7672  . Constipation 06/16/2010  . GERD 08/04/2009  . Prostate cancer ([place]) 08/21/2008  . Umbilical hernia 09/47/0962  . Hiatal hernia 02/13/2008  . DEGENERATIVE DISC DISEASE, CERVICAL SPINE 02/13/2008  . Diabetes mellitus type II, controlled ([place]) 06/04/2007  . Hyperlipidemia associated with type 2 diabetes mellitus ([place]) 06/04/2007  . Hypertension associated with diabetes ([place]) 06/04/2007  . CAD (coronary artery disease) 06/04/2007   Past Surgical History:  Procedure Laterality Date  . CORONARY ARTERY BYPASS GRAFT     Family History  Problem Relation Age of Onset  . Heart disease Mother   . Diabetes Mother    No Known Allergies Outpatient Encounter Medications as of 09/11/2020  Medication Sig Note  . ACCU-CHEK AVIVA PLUS test strip TEST BLOOD SUGAR TWICE DAILY   . ACCU-CHEK SOFTCLIX LANCETS lancets USE  TO  TEST TWICE DAILY   . Alcohol Swabs (B-D SINGLE USE SWABS REGULAR) PADS    . benazepril (LOTENSIN) 40 MG tablet  TAKE 1 TABLET EVERY DAY (Patient taking differently: Take 40 mg by mouth daily. )   . hydrochlorothiazide (HYDRODIURIL) 25 MG tablet Take 1 tablet by mouth once daily   . insulin NPH-regular Human (NOVOLIN 70/30) (70-30) 100 UNIT/ML injection Inject 12 units into the skin each morning and 6 units into the skin each evening.   . Insulin Pen Needle (BD PEN NEEDLE NANO U/F) 32G X 4 MM MISC USE TWICE DAILY. DX: E11.9   . Insulin Syringe-Needle U-100 (MONOJECT INS SYR 1CC/25G) 25G X 5/8" 1 ML MISC USE 1  TWICE DAILY AS NEEDED FOR  RELION  INSULIN  70/30   . metFORMIN (GLUCOPHAGE) 500 MG tablet TAKE 1 TABLET TWICE DAILY WITH MEALS (Patient taking differently: Take 500 mg by mouth 2 (two) times daily with a meal. )   . pantoprazole (PROTONIX) 40 MG tablet Take 1 tablet (40 mg total) by mouth daily at 6 (six) AM. (Patient not taking: Reported on 08/10/2020)   . rosuvastatin (CRESTOR) 10 MG tablet Take 1 tablet (10 mg total) by mouth daily.   Marland Kitchen sulfamethoxazole-trimethoprim (BACTRIM DS) 800-160 MG tablet Take 1 tablet by mouth 2 (two) times daily. 7 day supply 08/10/2020: Pt unsure of this med, I called pharmacy & he has not picked it up yet  . tamsulosin (FLOMAX) 0.4 MG CAPS capsule Take 1 capsule (0.4 mg total) by mouth daily. (Patient not taking: Reported on 08/10/2020)   . vitamin B-12 (CYANOCOBALAMIN) 100 MCG tablet Take 50 mcg by mouth daily.     . vitamin C (ASCORBIC ACID) 500 MG  tablet Take 500 mg by mouth daily.      No facility-administered encounter medications on file as of 09/11/2020.   Patient Care Team    Relationship Specialty Notifications Start End  [name], MD PCP - General Family Medicine  08/21/17   Buford Dresser, MD PCP - Cardiology Cardiology Admissions 06/26/19   Madelin Rear, [facility] Pharmacist Pharmacist  05/25/20    Comment: [phone]   Current Diagnosis/Assessment: Goals Addressed            This Visit's Progress   . PharmD Care Plan   On track    CARE  PLAN ENTRY (see longitudinal plan of care for additional care plan information)  Current Barriers:  . Chronic Disease Management support, education, and care coordination needs related to Hypertension, Hyperlipidemia, and Diabetes   Hypertension BP Readings from Last 3 Encounters:  05/26/20 136/72  05/18/20 (!) 175/73  04/26/20 138/75   . Pharmacist Clinical Goal(s): o Over the next 180 days, patient will work with PharmD and providers to maintain BP goal <140/90 . Current regimen:  o Benazepril 40 mg once daily . Interventions: o Counseled on medications, home monitoring  . Patient self care activities - Over the next 180 days, patient will: o Check BP at least once every 1-2 weeks, document, and provide at future appointments o Ensure daily salt intake < 2300 mg/day  Hyperlipidemia Lab Results  Component Value Date/Time   LDLCALC 84 05/26/2020 09:59 AM   LDLDIRECT 72.0 03/23/2017 11:05 AM   . Pharmacist Clinical Goal(s): o Over the next 180 days, patient will work with PharmD and providers to maintain LDL goal < 100 . Current regimen:  o Rosuvastatin 10 mg once daily . Interventions: o Continue current management . Patient self care activities - Over the next 180 days, patient will: o Continue current management  Diabetes Lab Results  Component Value Date/Time   HGBA1C 7.4 (H) 05/26/2020 09:59 AM   HGBA1C 7.4 (H) 06/12/2019 09:29 AM   . Pharmacist Clinical Goal(s): o Over the next 180 days, patient will work with PharmD and providers to maintain A1c goal <8% . Current regimen:  . Novolin 70/30 12 units into the skin each morning and 6 units into the skin each evening  . Metformin 500 mg twice daily  . Interventions: o Discussed low BG  . Patient self care activities - Over the next 180 days, patient will: o Check blood sugar as directed, document, and provide at future appointments o Contact provider with any episodes of hypoglycemia  Medication  management . Pharmacist Clinical Goal(s): o Over the next 180 days, patient will work with PharmD and providers to maintain optimal medication adherence . Current pharmacy: [facility] . Interventions o Comprehensive medication review performed. o Continue current medication management strategy . Patient self care activities - Over the next 180 days, patient will: o Take medications as prescribed o Report any questions or concerns to PharmD and/or provider(s) Initial goal documentation.      Hypertension   BP goal <140/90  BP Readings from Last 3 Encounters:  08/10/20 (!) 159/74  05/26/20 136/72  05/18/20 (!) 175/73   Patient checks BP at home daily. 130/70 today at home. Denies dizziness or recent fall after 07/2020 hospitalization. Patient is currently at goal on the following medications:  . Lotensin 40 mg once daily   Plan  Continue current medications and control with diet and exercise.   Diabetes   A1c goal < 8%  Lab Results  Component  Value Date/Time   HGBA1C 7.4 (H) 05/26/2020 09:59 AM   HGBA1C 7.4 (H) 06/12/2019 09:29 AM   MICROALBUR 0.7 06/05/2007 09:57 AM    Checking BG: Daily. Testing daily in the morning.  FBG today is 113, reports typically near this.  Denies any recent lows in 80s or less, cannot recall last time he had low readings.  Patient is currently controlled on the following medications:  . Novolin 70/30 12 units into the skin each morning and 6 units into the skin each evening  . Metformin 500 mg twice daily   We discussed: diet and exercise extensively and how to recognize and treat signs of hypoglycemia.  Plan  Continue current medications.  Medication Management   Receives prescription medications from:  [facility], [place] [address] Phone: [phone] Fax: [phone]  [facility] ([place]), [name] [place] ([place]) [place] [address] Phone: [phone] Fax: [phone]   Denies any issues with current medication management.   Plan  Continue current medication management strategy. ___________________________ SDOH (Social Determinants of Health) assessments performed: Yes.  Future Appointments  Date Time Provider [place]  10/02/2020 11:20 AM [name], MD [facility]   Visit follow-up:  . RPH follow-up: 6 month telephone visit.  Madelin Rear, Pharm.D., BCGP Clinical Pharmacist [facility] [phone]

## 2020-09-11 NOTE — Patient Instructions (Signed)
Please review care plan below and call me at [phone] with any questions!  Thank you, Edyth Gunnels., Clinical Pharmacist  Goals Addressed            This Visit's Progress   . PharmD Care Plan   On track    CARE PLAN ENTRY (see longitudinal plan of care for additional care plan information)  Current Barriers:  . Chronic Disease Management support, education, and care coordination needs related to Hypertension, Hyperlipidemia, and Diabetes   Hypertension BP Readings from Last 3 Encounters:  05/26/20 136/72  05/18/20 (!) 175/73  04/26/20 138/75   . Pharmacist Clinical Goal(s): o Over the next 180 days, patient will work with PharmD and providers to maintain BP goal <140/90 . Current regimen:  o Benazepril 40 mg once daily . Interventions: o Counseled on medications, home monitoring  . Patient self care activities - Over the next 180 days, patient will: o Check BP at least once every 1-2 weeks, document, and provide at future appointments o Ensure daily salt intake < 2300 mg/day  Hyperlipidemia Lab Results  Component Value Date/Time   LDLCALC 84 05/26/2020 09:59 AM   LDLDIRECT 72.0 03/23/2017 11:05 AM   . Pharmacist Clinical Goal(s): o Over the next 180 days, patient will work with PharmD and providers to maintain LDL goal < 100 . Current regimen:  o Rosuvastatin 10 mg once daily . Interventions: o Continue current management . Patient self care activities - Over the next 180 days, patient will: o Continue current management  Diabetes Lab Results  Component Value Date/Time   HGBA1C 7.4 (H) 05/26/2020 09:59 AM   HGBA1C 7.4 (H) 06/12/2019 09:29 AM   . Pharmacist Clinical Goal(s): o Over the next 180 days, patient will work with PharmD and providers to maintain A1c goal <8% . Current regimen:  . Novolin 70/30 12 units into the skin each morning and 6 units into the skin each evening  . Metformin 500 mg twice daily  . Interventions: o Discussed low BG  . Patient  self care activities - Over the next 180 days, patient will: o Check blood sugar as directed, document, and provide at future appointments o Contact provider with any episodes of hypoglycemia  Medication management . Pharmacist Clinical Goal(s): o Over the next 180 days, patient will work with PharmD and providers to maintain optimal medication adherence . Current pharmacy: [facility] . Interventions o Comprehensive medication review performed. o Continue current medication management strategy . Patient self care activities - Over the next 180 days, patient will: o Take medications as prescribed o Report any questions or concerns to PharmD and/or provider(s) Initial goal documentation.      The patient verbalized understanding of instructions provided today and agreed to receive a mailed copy of patient instruction and/or educational materials. Telephone follow up appointment with pharmacy team member scheduled for: See next appointment with "Care Management Staff" under "What's Next" below.   Madelin Rear, Pharm.D., BCGP Clinical Pharmacist [facility] [phone]

## 2020-09-19 ENCOUNTER — Other Ambulatory Visit: Payer: Self-pay | Admitting: Family Medicine

## 2020-10-01 NOTE — Patient Instructions (Addendum)
Please stop by lab before you go If you have mychart- we will send your results within 3 business days of [place] receiving them.  If you do not have mychart- we will call you about results within 5 business days of [place] receiving them.  *please note we are currently using [facility] which has a longer processing time than [facility] typically so labs may not come back as quickly as in the past *please also note that you will see labs on mychart as soon as they post. I will later go in and write notes on them- will say "notes from Dr. Yong Channel"  Health Maintenance Due  Topic Date Due  . COVID-19 Vaccine (3 - Pfizer risk 4-dose series) - I would strongly recommend this 03/10/2020  . OPHTHALMOLOGY EXAM/eye doctor.   Pt cancelled the appointment states his vision is good with or without glasses. He will call and make another appointment.  09/03/2020   Start iron/ferrous sulfate 325 mg twice a week  [facility]- needs new rx for catheters. See handout- have them call us if any issues   Recommended follow up: Return in about 4 months (around 01/31/2021) for physical or sooner if needed.

## 2020-10-01 NOTE — Progress Notes (Signed)
Phone [phone] In person visit   Subjective:   [name] is a 86 y.o. year old very pleasant male patient who presents for/with See problem oriented charting Chief Complaint  Patient presents with  . Hypertension  . Hyperlipidemia  . Diabetes    This visit occurred during the SARS-CoV-2 public health emergency.  Safety protocols were in place, including screening questions prior to the visit, additional usage of staff PPE, and extensive cleaning of exam room while observing appropriate contact time as indicated for disinfecting solutions.   Past Medical History-  Patient Active Problem List   Diagnosis Date Noted  . Epistaxis, recurrent 12/31/2018    Priority: High  . Diastolic dysfunction 07/30/1218    Priority: High  . Prostate cancer ([place]) 08/21/2008    Priority: High  . Diabetes mellitus type II, controlled ([place]) 06/04/2007    Priority: High  . CAD (coronary artery disease) 06/04/2007    Priority: High  . Insomnia 07/06/2017    Priority: Medium  . Hyperlipidemia associated with type 2 diabetes mellitus ([place]) 06/04/2007    Priority: Medium  . Hypertension associated with diabetes ([place]) 06/04/2007    Priority: Medium  . Constipation 06/16/2010    Priority: Low  . GERD 08/04/2009    Priority: Low  . Umbilical hernia 75/88/3254    Priority: Low  . Hiatal hernia 02/13/2008    Priority: Low  . DEGENERATIVE DISC DISEASE, CERVICAL SPINE 02/13/2008    Priority: Low  . Hematemesis 01/01/2019  . Cellulitis of left eyelid 01/01/2019  . Heme positive stool     Medications- reviewed and updated Current Outpatient Medications  Medication Sig Dispense Refill  . benazepril (LOTENSIN) 40 MG tablet TAKE 1 TABLET EVERY DAY 90 tablet 0  . hydrochlorothiazide (HYDRODIURIL) 25 MG tablet Take 1 tablet by mouth once daily 90 tablet 0  . ACCU-CHEK AVIVA PLUS test strip TEST BLOOD SUGAR TWICE DAILY 200 strip 0  . ACCU-CHEK SOFTCLIX LANCETS lancets USE  TO  TEST TWICE  DAILY 200 each 3  . Alcohol Swabs (B-D SINGLE USE SWABS REGULAR) PADS     . [START ON 10/05/2020] ferrous sulfate 325 (65 FE) MG tablet Take 1 tablet (325 mg total) by mouth 2 (two) times a week. 26 tablet 3  . insulin NPH-regular Human (NOVOLIN 70/30) (70-30) 100 UNIT/ML injection Inject 12 units into the skin each morning and 6 units into the skin each evening. 10 mL 5  . Insulin Pen Needle (BD PEN NEEDLE NANO U/F) 32G X 4 MM MISC USE TWICE DAILY. DX: E11.9 90 each 11  . Insulin Syringe-Needle U-100 (MONOJECT INS SYR 1CC/25G) 25G X 5/8" 1 ML MISC USE 1  TWICE DAILY AS NEEDED FOR  RELION  INSULIN  70/30 100 each 0  . metFORMIN (GLUCOPHAGE) 500 MG tablet TAKE 1 TABLET TWICE DAILY WITH MEALS 180 tablet 0  . pantoprazole (PROTONIX) 40 MG tablet Take 1 tablet (40 mg total) by mouth daily at 6 (six) AM. (Patient not taking: Reported on 08/10/2020) 30 tablet 0  . rosuvastatin (CRESTOR) 10 MG tablet Take 1 tablet (10 mg total) by mouth daily. 90 tablet 3  . sulfamethoxazole-trimethoprim (BACTRIM DS) 800-160 MG tablet Take 1 tablet by mouth 2 (two) times daily. 7 day supply    . tamsulosin (FLOMAX) 0.4 MG CAPS capsule Take 1 capsule (0.4 mg total) by mouth daily. (Patient not taking: Reported on 08/10/2020) 30 capsule 0  . vitamin B-12 (CYANOCOBALAMIN) 100 MCG tablet Take 50 mcg by mouth  daily.      . vitamin C (ASCORBIC ACID) 500 MG tablet Take 500 mg by mouth daily.       No current facility-administered medications for this visit.     Objective:  BP 138/72   Pulse 85   Temp 98.5 F (36.9 C) (Temporal)   Ht 5\' 8"  (1.727 m)   Wt 214 lb (97.1 kg)   SpO2 99%   BMI 32.54 kg/m  Gen: NAD, deaf/hard of hearing CV: RRR no murmurs rubs or gallops Lungs: CTAB no crackles, wheeze, rhonchi Abdomen: soft/nontender/nondistended/normal bowel sounds. No rebound or guarding.  Ext: no edema Skin: warm, dry     Assessment and Plan   # low ferritin- 05/27/2020 Iron levels are low-would recommend adding  ferrous sulfate 325 mg over-the-counter twice a week to build the stores up.  Team please add this to medication list. Patient states never took this- reinforced today and sent in script to [facility]. Check stores again today  # Diabetes S: Medication:NPH insulin 12 units in the morning and 6 units in the evening, Metformin 500 mg twice daily CBGs- 120 this AM  Lab Results  Component Value Date   HGBA1C 7.4 (H) 05/26/2020   A/P: a1c goal under 8 reasonable- update a1c with labs and likely continue current meds  #hyperlipidemia/CAD with history of CABG S: Medication: Compliant with statin-rosuvastatin 10 mg (advanced last visit) and aspirin 81 mg  Patient denies symptoms such as chest pain or shortness of breath.   Lab Results  Component Value Date   CHOL 153 05/26/2020   HDL 46 05/26/2020   LDLCALC 84 05/26/2020   LDLDIRECT 72.0 03/23/2017   TRIG 131 05/26/2020   CHOLHDL 3.3 05/26/2020   A/P: hopefully improved- update LDL today with goal under 70. CAD aysmptomatic- continue current meds    #hypertension S: medication: Benazepril 40 mg, hctz 25 mg Home readings #s: 134/74 this am A/P: reasonable control- continue current meds   #Prostate cancer-chronically elevated PSA-patient self caths 2-3 times a day.  Refused treatment in 2008 when discovered.needs new rx  Recommended follow up: Return in about 4 months (around 01/31/2021) for physical or sooner if needed. Future Appointments  Date Time Provider [place]  03/15/2021  1:30 PM [facility] CCM PHARMACIST [facility]   Lab/Order associations:   ICD-10-CM   1. Controlled type 2 diabetes mellitus with other circulatory complication, with long-term current use of insulin (HCC)  E11.59 Hemoglobin A1c   Z79.4 CBC With Differential/Platelet    COMPLETE METABOLIC PANEL WITH GFR  2. Low ferritin  R79.0 Iron, TIBC and Ferritin Panel  3. Hyperlipidemia associated with type 2 diabetes mellitus (HCC)  E11.69 LDL cholesterol, direct    E78.5     Meds ordered this encounter  Medications  . ferrous sulfate 325 (65 FE) MG tablet    Sig: Take 1 tablet (325 mg total) by mouth 2 (two) times a week.    Dispense:  26 tablet    Refill:  3   Return precautions advised.  [name], MD

## 2020-10-02 ENCOUNTER — Other Ambulatory Visit: Payer: Self-pay

## 2020-10-02 ENCOUNTER — Ambulatory Visit (INDEPENDENT_AMBULATORY_CARE_PROVIDER_SITE_OTHER): Payer: Medicare HMO | Admitting: Family Medicine

## 2020-10-02 ENCOUNTER — Encounter: Payer: Self-pay | Admitting: Family Medicine

## 2020-10-02 VITALS — BP 138/72 | HR 85 | Temp 98.5°F | Ht 68.0 in | Wt 214.0 lb

## 2020-10-02 DIAGNOSIS — E1169 Type 2 diabetes mellitus with other specified complication: Secondary | ICD-10-CM | POA: Diagnosis not present

## 2020-10-02 DIAGNOSIS — E785 Hyperlipidemia, unspecified: Secondary | ICD-10-CM

## 2020-10-02 DIAGNOSIS — C61 Malignant neoplasm of prostate: Secondary | ICD-10-CM | POA: Diagnosis not present

## 2020-10-02 DIAGNOSIS — I152 Hypertension secondary to endocrine disorders: Secondary | ICD-10-CM | POA: Diagnosis not present

## 2020-10-02 DIAGNOSIS — Z794 Long term (current) use of insulin: Secondary | ICD-10-CM | POA: Diagnosis not present

## 2020-10-02 DIAGNOSIS — I2510 Atherosclerotic heart disease of native coronary artery without angina pectoris: Secondary | ICD-10-CM

## 2020-10-02 DIAGNOSIS — R790 Abnormal level of blood mineral: Secondary | ICD-10-CM | POA: Diagnosis not present

## 2020-10-02 DIAGNOSIS — E1159 Type 2 diabetes mellitus with other circulatory complications: Secondary | ICD-10-CM

## 2020-10-02 MED ORDER — FERROUS SULFATE 325 (65 FE) MG PO TABS: 325.0000 mg | ORAL_TABLET | ORAL | 3 refills | Status: AC

## 2020-10-03 LAB — CBC WITH DIFFERENTIAL/PLATELET
Absolute Monocytes: 681 cells/uL (ref 200–950)
Basophils Absolute: 37 cells/uL (ref 0–200)
Basophils Relative: 0.5 %
Eosinophils Absolute: 118 cells/uL (ref 15–500)
Eosinophils Relative: 1.6 %
HCT: 27 % — ABNORMAL LOW (ref 38.5–50.0)
Hemoglobin: 8.3 g/dL — ABNORMAL LOW (ref 13.2–17.1)
Lymphs Abs: 1746 cells/uL (ref 850–3900)
MCH: 28.5 pg (ref 27.0–33.0)
MCHC: 30.7 g/dL — ABNORMAL LOW (ref 32.0–36.0)
MCV: 92.8 fL (ref 80.0–100.0)
MPV: 10 fL (ref 7.5–12.5)
Monocytes Relative: 9.2 %
Neutro Abs: 4817 cells/uL (ref 1500–7800)
Neutrophils Relative %: 65.1 %
Platelets: 465 10*3/uL — ABNORMAL HIGH (ref 140–400)
RBC: 2.91 10*6/uL — ABNORMAL LOW (ref 4.20–5.80)
RDW: 14 % (ref 11.0–15.0)
Total Lymphocyte: 23.6 %
WBC: 7.4 10*3/uL (ref 3.8–10.8)

## 2020-10-03 LAB — HEMOGLOBIN A1C
Hgb A1c MFr Bld: 7.3 % of total Hgb — ABNORMAL HIGH (ref <5.7)
Mean Plasma Glucose: 163 mg/dL
eAG (mmol/L): 9 mmol/L

## 2020-10-03 LAB — LDL CHOLESTEROL, DIRECT: Direct LDL: 53 mg/dL (ref <100)

## 2020-10-03 LAB — IRON,TIBC AND FERRITIN PANEL
%SAT: 11 % (calc) — ABNORMAL LOW (ref 20–48)
Ferritin: 8 ng/mL — ABNORMAL LOW (ref 24–380)
Iron: 35 ug/dL — ABNORMAL LOW (ref 50–180)
TIBC: 321 mcg/dL (calc) (ref 250–425)

## 2020-10-03 LAB — COMPLETE METABOLIC PANEL WITH GFR
AG Ratio: 1.3 (calc) (ref 1.0–2.5)
ALT: 11 U/L (ref 9–46)
AST: 13 U/L (ref 10–35)
Albumin: 3.7 g/dL (ref 3.6–5.1)
Alkaline phosphatase (APISO): 48 U/L (ref 35–144)
BUN/Creatinine Ratio: 14 (calc) (ref 6–22)
BUN: 20 mg/dL (ref 7–25)
CO2: 25 mmol/L (ref 20–32)
Calcium: 9.4 mg/dL (ref 8.6–10.3)
Chloride: 110 mmol/L (ref 98–110)
Creat: 1.4 mg/dL — ABNORMAL HIGH (ref 0.70–1.11)
GFR, Est African American: 48 mL/min/{1.73_m2} — ABNORMAL LOW (ref >=60)
GFR, Est Non African American: 42 mL/min/{1.73_m2} — ABNORMAL LOW (ref >=60)
Globulin: 2.9 g/dL (calc) (ref 1.9–3.7)
Glucose, Bld: 62 mg/dL — ABNORMAL LOW (ref 65–99)
Potassium: 4.8 mmol/L (ref 3.5–5.3)
Sodium: 142 mmol/L (ref 135–146)
Total Bilirubin: 0.4 mg/dL (ref 0.2–1.2)
Total Protein: 6.6 g/dL (ref 6.1–8.1)

## 2020-10-06 ENCOUNTER — Other Ambulatory Visit: Payer: Self-pay

## 2020-10-06 DIAGNOSIS — D500 Iron deficiency anemia secondary to blood loss (chronic): Secondary | ICD-10-CM

## 2020-11-05 ENCOUNTER — Telehealth: Payer: Self-pay

## 2020-11-05 NOTE — Chronic Care Management (AMB) (Signed)
Chronic Care Management Pharmacy Assistant   Name: [name]  MRN: [id number] DOB: Jan 04, 1921  Reason for Encounter: Disease State/ Hypertension Adherence Call  PCP : [name], MD  Allergies:  No Known Allergies  Medications: Outpatient Encounter Medications as of 11/05/2020  Medication Sig Note  . ACCU-CHEK AVIVA PLUS test strip TEST BLOOD SUGAR TWICE DAILY   . ACCU-CHEK SOFTCLIX LANCETS lancets USE  TO  TEST TWICE DAILY   . Alcohol Swabs (B-D SINGLE USE SWABS REGULAR) PADS    . benazepril (LOTENSIN) 40 MG tablet TAKE 1 TABLET EVERY DAY   . ferrous sulfate 325 (65 FE) MG tablet Take 1 tablet (325 mg total) by mouth 2 (two) times a week.   . hydrochlorothiazide (HYDRODIURIL) 25 MG tablet Take 1 tablet by mouth once daily   . insulin NPH-regular Human (NOVOLIN 70/30) (70-30) 100 UNIT/ML injection Inject 12 units into the skin each morning and 6 units into the skin each evening.   . Insulin Pen Needle (BD PEN NEEDLE NANO U/F) 32G X 4 MM MISC USE TWICE DAILY. DX: E11.9   . Insulin Syringe-Needle U-100 (MONOJECT INS SYR 1CC/25G) 25G X 5/8" 1 ML MISC USE 1  TWICE DAILY AS NEEDED FOR  RELION  INSULIN  70/30   . metFORMIN (GLUCOPHAGE) 500 MG tablet TAKE 1 TABLET TWICE DAILY WITH MEALS   . pantoprazole (PROTONIX) 40 MG tablet Take 1 tablet (40 mg total) by mouth daily at 6 (six) AM. (Patient not taking: Reported on 08/10/2020)   . rosuvastatin (CRESTOR) 10 MG tablet Take 1 tablet (10 mg total) by mouth daily.   Marland Kitchen sulfamethoxazole-trimethoprim (BACTRIM DS) 800-160 MG tablet Take 1 tablet by mouth 2 (two) times daily. 7 day supply 08/10/2020: Pt unsure of this med, I called pharmacy & he has not picked it up yet  . tamsulosin (FLOMAX) 0.4 MG CAPS capsule Take 1 capsule (0.4 mg total) by mouth daily. (Patient not taking: Reported on 08/10/2020)   . vitamin B-12 (CYANOCOBALAMIN) 100 MCG tablet Take 50 mcg by mouth daily.     . vitamin C (ASCORBIC ACID) 500 MG tablet Take 500 mg  by mouth daily.      No facility-administered encounter medications on file as of 11/05/2020.    Current Diagnosis: Patient Active Problem List   Diagnosis Date Noted  . Hematemesis 01/01/2019  . Cellulitis of left eyelid 01/01/2019  . Heme positive stool   . Epistaxis, recurrent 12/31/2018  . Insomnia 07/06/2017  . Diastolic dysfunction 79/11/4095  . Constipation 06/16/2010  . GERD 08/04/2009  . Prostate cancer ([place]) 08/21/2008  . Umbilical hernia 35/32/9924  . Hiatal hernia 02/13/2008  . DEGENERATIVE DISC DISEASE, CERVICAL SPINE 02/13/2008  . Diabetes mellitus type II, controlled ([place]) 06/04/2007  . Hyperlipidemia associated with type 2 diabetes mellitus ([place]) 06/04/2007  . Hypertension associated with diabetes ([place]) 06/04/2007  . CAD (coronary artery disease) 06/04/2007    Reviewed chart prior to disease state call. Spoke with patient regarding BP  Recent Office Vitals: BP Readings from Last 3 Encounters:  10/02/20 138/72  08/10/20 (!) 159/74  05/26/20 136/72   Pulse Readings from Last 3 Encounters:  10/02/20 85  08/10/20 89  05/26/20 82    Wt Readings from Last 3 Encounters:  10/02/20 214 lb (97.1 kg)  05/26/20 208 lb (94.3 kg)  12/19/19 214 lb (97.1 kg)     Kidney Function Lab Results  Component Value Date/Time   CREATININE 1.40 (H) 10/02/2020 12:28 PM  CREATININE 1.36 (H) 08/10/2020 08:35 AM   CREATININE 1.31 (H) 05/26/2020 09:59 AM   GFR 60.76 07/29/2019 10:21 AM   GFRNONAA 42 (L) 10/02/2020 12:28 PM   GFRAA 48 (L) 10/02/2020 12:28 PM    BMP Latest Ref Rng & Units 10/02/2020 08/10/2020 05/26/2020  Glucose 65 - 99 mg/dL 62(L) 136(H) 126(H)  BUN 7 - 25 mg/dL 20 23 19   Creatinine 0.70 - 1.11 mg/dL 1.40(H) 1.36(H) 1.31(H)  BUN/Creat Ratio 6 - 22 (calc) 14 - 15  Sodium 135 - 146 mmol/L 142 139 139  Potassium 3.5 - 5.3 mmol/L 4.8 3.8 4.9  Chloride 98 - 110 mmol/L 110 105 106  CO2 20 - 32 mmol/L 25 23 27   Calcium 8.6 - 10.3 mg/dL 9.4 8.9 9.5     . Current antihypertensive regimen:  o Benazepril 40 mg tablet once a day o Hydrochlorothiazide 25 mg tablet once a day  . How often are you checking your Blood Pressure?   Patient states he checks his blood pressure twice  daily, once in the morning and once in the evening.   . Current home BP readings: Patient states his blood pressures average at 130/70.  Marland Kitchen What recent interventions/DTPs have been made by any provider to improve Blood Pressure control since last CPP Visit: Patient denies any recent interventions/DTPs at this time since his has CPP visit.  . Any recent hospitalizations or ED visits since last visit with CPP? Patient has not had any recent hospitalizations or ED visits since last visit with CPP.  Marland Kitchen What diet changes have been made to improve Blood Pressure Control?  o Patient states he follows a low sodium diet. Patient states he does not eat a lot of salt or foods that contain a lot of sodium.  . What exercise is being done to improve your Blood Pressure Control?  o Patient states he has a stationary bike in his living room that he tries to get on daily for at least 10-15 minutes.  Adherence Review: Is the patient currently on ACE/ARB medication? Yes Does the patient have >5 day gap between last estimated fill dates? No  Patient states he is overall happy with how well his blood pressure is controlled at this time considering he will turn 87 years old tomorrow on 11/06/2020.   April D Calhoun, [place] Pharmacist Assistant [phone]  Follow-Up:  Pharmacist Review

## 2020-11-05 NOTE — Chronic Care Management (AMB) (Signed)
              Patient Telephone Call    Patient called and left a voicemail stating he did not tell the truth when he spoke with me this morning for his Hypertension Adherence Call about exercising. Patient told me this morning that he exercised daily on his stationary bike 15-20 minutes a day. Patient called and left me a voicemail apologizing stating he does not have the energy to exercise at this time.   April D Calhoun, [place] Pharmacist Assistant [phone]

## 2020-11-20 DIAGNOSIS — Z794 Long term (current) use of insulin: Secondary | ICD-10-CM | POA: Diagnosis not present

## 2020-11-20 DIAGNOSIS — E119 Type 2 diabetes mellitus without complications: Secondary | ICD-10-CM | POA: Diagnosis not present

## 2020-11-20 DIAGNOSIS — Z7984 Long term (current) use of oral hypoglycemic drugs: Secondary | ICD-10-CM | POA: Diagnosis not present

## 2020-11-20 DIAGNOSIS — H524 Presbyopia: Secondary | ICD-10-CM | POA: Diagnosis not present

## 2020-11-20 DIAGNOSIS — H5203 Hypermetropia, bilateral: Secondary | ICD-10-CM | POA: Diagnosis not present

## 2020-11-20 DIAGNOSIS — H25813 Combined forms of age-related cataract, bilateral: Secondary | ICD-10-CM | POA: Diagnosis not present

## 2020-11-20 DIAGNOSIS — I10 Essential (primary) hypertension: Secondary | ICD-10-CM | POA: Diagnosis not present

## 2020-11-20 DIAGNOSIS — H52223 Regular astigmatism, bilateral: Secondary | ICD-10-CM | POA: Diagnosis not present

## 2020-11-20 LAB — HM DIABETES EYE EXAM

## 2020-11-28 ENCOUNTER — Other Ambulatory Visit: Payer: Self-pay | Admitting: Family Medicine

## 2020-12-05 ENCOUNTER — Other Ambulatory Visit: Payer: Self-pay | Admitting: Family Medicine

## 2020-12-07 ENCOUNTER — Telehealth: Payer: Self-pay

## 2020-12-07 MED ORDER — NOVOLIN 70/30 (70-30) 100 UNIT/ML ~~LOC~~ SUSP: SUBCUTANEOUS | 5 refills | Status: DC

## 2020-12-07 MED ORDER — NOVOLIN 70/30 (70-30) 100 UNIT/ML ~~LOC~~ SUSP: SUBCUTANEOUS | 5 refills | Status: AC

## 2020-12-07 NOTE — Addendum Note (Signed)
Addended by: Clyde Lundborg A on: 12/07/2020 02:04 PM   Modules accepted: Orders

## 2020-12-07 NOTE — Telephone Encounter (Signed)
Rx faxed to pharmacy  

## 2020-12-07 NOTE — Telephone Encounter (Signed)
MEDICATION: insulin NPH-regular Human (NOVOLIN 70/30) (70-30) 100 UNIT/ML injection  PHARMACY: [facility] [address]  Comments:   **Let patient know to contact pharmacy at the end of the day to make sure medication is ready. **  ** Please notify patient to allow 48-72 hours to process**  **Encourage patient to contact the pharmacy for refills or they can request refills through [facility]**

## 2020-12-22 ENCOUNTER — Telehealth: Payer: Self-pay

## 2020-12-22 NOTE — Chronic Care Management (AMB) (Signed)
Chronic Care Management Pharmacy Assistant   Name: [name]  MRN: [id number] DOB: [birth year]  Reason for Encounter: Disease State/ Hypertension Adherence Call  PCP : [name], MD  Allergies:  No Known Allergies  Medications: Outpatient Encounter Medications as of 12/22/2020  Medication Sig Note  . ACCU-CHEK AVIVA PLUS test strip TEST BLOOD SUGAR TWICE DAILY   . ACCU-CHEK SOFTCLIX LANCETS lancets USE  TO  TEST TWICE DAILY   . Alcohol Swabs (B-D SINGLE USE SWABS REGULAR) PADS    . benazepril (LOTENSIN) 40 MG tablet Take 1 tablet by mouth once daily   . ferrous sulfate 325 (65 FE) MG tablet Take 1 tablet (325 mg total) by mouth 2 (two) times a week.   . hydrochlorothiazide (HYDRODIURIL) 25 MG tablet Take 1 tablet by mouth once daily   . insulin NPH-regular Human (NOVOLIN 70/30) (70-30) 100 UNIT/ML injection Inject 12 units into the skin each morning and 6 units into the skin each evening.   . Insulin Pen Needle (BD PEN NEEDLE NANO U/F) 32G X 4 MM MISC USE TWICE DAILY. DX: E11.9   . Insulin Syringe-Needle U-100 (MONOJECT INS SYR 1CC/25G) 25G X 5/8" 1 ML MISC USE 1  TWICE DAILY AS NEEDED FOR  RELION  INSULIN  70/30   . metFORMIN (GLUCOPHAGE) 500 MG tablet TAKE 1 TABLET TWICE DAILY WITH MEALS   . pantoprazole (PROTONIX) 40 MG tablet Take 1 tablet (40 mg total) by mouth daily at 6 (six) AM. (Patient not taking: Reported on 08/10/2020)   . rosuvastatin (CRESTOR) 10 MG tablet Take 1 tablet (10 mg total) by mouth daily.   Marland Kitchen sulfamethoxazole-trimethoprim (BACTRIM DS) 800-160 MG tablet Take 1 tablet by mouth 2 (two) times daily. 7 day supply 08/10/2020: Pt unsure of this med, I called pharmacy & he has not picked it up yet  . tamsulosin (FLOMAX) 0.4 MG CAPS capsule Take 1 capsule (0.4 mg total) by mouth daily. (Patient not taking: Reported on 08/10/2020)   . vitamin B-12 (CYANOCOBALAMIN) 100 MCG tablet Take 50 mcg by mouth daily.     . vitamin C (ASCORBIC ACID) 500 MG tablet Take  500 mg by mouth daily.      No facility-administered encounter medications on file as of 12/22/2020.    Current Diagnosis: Patient Active Problem List   Diagnosis Date Noted  . Hematemesis 01/01/2019  . Cellulitis of left eyelid 01/01/2019  . Heme positive stool   . Epistaxis, recurrent 12/31/2018  . Insomnia 07/06/2017  . Diastolic dysfunction 93/81/8299  . Constipation 06/16/2010  . GERD 08/04/2009  . Prostate cancer ([place]) 08/21/2008  . Umbilical hernia 37/16/9678  . Hiatal hernia 02/13/2008  . DEGENERATIVE DISC DISEASE, CERVICAL SPINE 02/13/2008  . Diabetes mellitus type II, controlled ([place]) 06/04/2007  . Hyperlipidemia associated with type 2 diabetes mellitus ([place]) 06/04/2007  . Hypertension associated with diabetes ([place]) 06/04/2007  . CAD (coronary artery disease) 06/04/2007    Reviewed chart prior to disease state call. Spoke with patient regarding BP  Recent Office Vitals: BP Readings from Last 3 Encounters:  10/02/20 138/72  08/10/20 (!) 159/74  05/26/20 136/72   Pulse Readings from Last 3 Encounters:  10/02/20 85  08/10/20 89  05/26/20 82    Wt Readings from Last 3 Encounters:  10/02/20 214 lb (97.1 kg)  05/26/20 208 lb (94.3 kg)  12/19/19 214 lb (97.1 kg)     Kidney Function Lab Results  Component Value Date/Time   CREATININE 1.40 (H) 10/02/2020 12:28  PM   CREATININE 1.36 (H) 08/10/2020 08:35 AM   CREATININE 1.31 (H) 05/26/2020 09:59 AM   GFR 60.76 07/29/2019 10:21 AM   GFRNONAA 42 (L) 10/02/2020 12:28 PM   GFRAA 48 (L) 10/02/2020 12:28 PM    BMP Latest Ref Rng & Units 10/02/2020 08/10/2020 05/26/2020  Glucose 65 - 99 mg/dL 62(L) 136(H) 126(H)  BUN 7 - 25 mg/dL 20 23 19   Creatinine 0.70 - 1.11 mg/dL 1.40(H) 1.36(H) 1.31(H)  BUN/Creat Ratio 6 - 22 (calc) 14 - 15  Sodium 135 - 146 mmol/L 142 139 139  Potassium 3.5 - 5.3 mmol/L 4.8 3.8 4.9  Chloride 98 - 110 mmol/L 110 105 106  CO2 20 - 32 mmol/L 25 23 27   Calcium 8.6 - 10.3 mg/dL 9.4 8.9 9.5     . Current antihypertensive regimen:  o Benazepril 40 mg daily o Hydrochlorothiazide 25 mg daily  . How often are you checking your Blood Pressure? 1-2x per week   . Current home BP readings: 134/70  . What recent interventions/DTPs have been made by any provider to improve Blood Pressure control since last CPP Visit: None medication changes noted. Patient states he is taking his blood pressure medications as instructed.  . Any recent hospitalizations or ED visits since last visit with CPP? No , patient states he has not had any recent hospitalizations or ED visits since his last adherence call.  . What diet changes have been made to improve Blood Pressure Control?  o Patient states he has not had any diet changes. Patient states he does not have much of an appetite. However, he forces himself to eat.  . What exercise is being done to improve your Blood Pressure Control?  o Patient states he has a stationary bike in his home. He states he doesn't feel up to it to get on it.  Adherence Review: Is the patient currently on ACE/ARB medication? Yes Does the patient have >5 day gap between last estimated fill dates? No  Future Appointments  Date Time Provider Lane  02/16/2021  1:20 PM [name], MD [facility]  03/15/2021  1:30 PM [facility] PHARMACIST [facility]    April D Calhoun, [place] Pharmacist Assistant [phone]   Follow-Up:  Pharmacist Review

## 2021-02-06 ENCOUNTER — Other Ambulatory Visit: Payer: Self-pay | Admitting: Family Medicine

## 2021-02-15 NOTE — Patient Instructions (Incomplete)
Please stop by lab before you go If you have mychart- we will send your results within 3 business days of [place] receiving them.  If you do not have mychart- we will call you about results within 5 business days of [place] receiving them.  *please also note that you will see labs on mychart as soon as they post. I will later go in and write notes on them- will say "notes from Dr. Yong Channel"  Health Maintenance Due  Topic Date Due  . COVID-19 Vaccine (3 - Pfizer risk 4-dose series) 03/10/2020   Depression screen [facility] 2/9 10/02/2020 07/29/2019 06/12/2019  Decreased Interest 0 0 0  Down, Depressed, Hopeless 0 0 0  PHQ - 2 Score 0 0 0  Altered sleeping - 0 -  Tired, decreased energy - 0 -  Change in appetite - 0 -  Feeling bad or failure about yourself  - 0 -  Trouble concentrating - 0 -  Moving slowly or fidgety/restless - 0 -  Suicidal thoughts - 0 -  PHQ-9 Score - 0 -  Difficult doing work/chores - Not difficult at all -  Some recent data might be hidden

## 2021-02-15 NOTE — Progress Notes (Deleted)
Phone: [phone]   Subjective:  Patient presents today for their annual physical. Chief complaint-noted.   See problem oriented charting- ROS- full  review of systems was completed and negative  except for: ***  The following were reviewed and entered/updated in epic: Past Medical History:  Diagnosis Date  . Arthritis   . CAD (coronary artery disease)    post diaphragjatic wall infarction and subsequent coronary artery bypass graft surgery in 2003, now stable  . Diabetes mellitus   . Diastolic CHF ([place]) 3/35/4562   02/2013 Echo Grade I diastoilc dysfunction. Off lasix for several months 40mg - 04/14/16. Continue to monitor  . Elevated PSA   . GERD (gastroesophageal reflux disease)   . Hemorrhoids   . Hx of adenomatous colonic polyps   . Hyperlipidemia   . Hypertension   . Prostate cancer ([place])   . Urinary retention    Patient Active Problem List   Diagnosis Date Noted  . Hematemesis 01/01/2019  . Cellulitis of left eyelid 01/01/2019  . Heme positive stool   . Epistaxis, recurrent 12/31/2018  . Insomnia 07/06/2017  . Diastolic dysfunction 56/38/9373  . Constipation 06/16/2010  . GERD 08/04/2009  . Prostate cancer ([place]) 08/21/2008  . Umbilical hernia 42/87/6811  . Hiatal hernia 02/13/2008  . DEGENERATIVE DISC DISEASE, CERVICAL SPINE 02/13/2008  . Diabetes mellitus type II, controlled ([place]) 06/04/2007  . Hyperlipidemia associated with type 2 diabetes mellitus ([place]) 06/04/2007  . Hypertension associated with diabetes ([place]) 06/04/2007  . CAD (coronary artery disease) 06/04/2007   Past Surgical History:  Procedure Laterality Date  . CORONARY ARTERY BYPASS GRAFT      Family History  Problem Relation Age of Onset  . Heart disease Mother   . Diabetes Mother     Medications- reviewed and updated Current Outpatient Medications  Medication Sig Dispense Refill  . ACCU-CHEK AVIVA PLUS test strip TEST BLOOD SUGAR TWICE DAILY 200 strip 0  . ACCU-CHEK SOFTCLIX LANCETS  lancets USE  TO  TEST TWICE DAILY 200 each 3  . Alcohol Swabs (B-D SINGLE USE SWABS REGULAR) PADS     . benazepril (LOTENSIN) 40 MG tablet Take 1 tablet by mouth once daily 90 tablet 0  . ferrous sulfate 325 (65 FE) MG tablet Take 1 tablet (325 mg total) by mouth 2 (two) times a week. 26 tablet 3  . hydrochlorothiazide (HYDRODIURIL) 25 MG tablet Take 1 tablet by mouth once daily 90 tablet 0  . insulin NPH-regular Human (NOVOLIN 70/30) (70-30) 100 UNIT/ML injection Inject 12 units into the skin each morning and 6 units into the skin each evening. 10 mL 5  . Insulin Pen Needle (BD PEN NEEDLE NANO U/F) 32G X 4 MM MISC USE TWICE DAILY. DX: E11.9 90 each 11  . Insulin Syringe-Needle U-100 (MONOJECT INS SYR 1CC/25G) 25G X 5/8" 1 ML MISC USE 1  TWICE DAILY AS NEEDED FOR  RELION  INSULIN  70/30 100 each 0  . metFORMIN (GLUCOPHAGE) 500 MG tablet TAKE 1 TABLET TWICE DAILY WITH MEALS 180 tablet 0  . pantoprazole (PROTONIX) 40 MG tablet Take 1 tablet (40 mg total) by mouth daily at 6 (six) AM. (Patient not taking: Reported on 08/10/2020) 30 tablet 0  . rosuvastatin (CRESTOR) 10 MG tablet Take 1 tablet (10 mg total) by mouth daily. 90 tablet 3  . sulfamethoxazole-trimethoprim (BACTRIM DS) 800-160 MG tablet Take 1 tablet by mouth 2 (two) times daily. 7 day supply    . tamsulosin (FLOMAX) 0.4 MG CAPS capsule Take 1 capsule (  0.4 mg total) by mouth daily. (Patient not taking: Reported on 08/10/2020) 30 capsule 0  . vitamin B-12 (CYANOCOBALAMIN) 100 MCG tablet Take 50 mcg by mouth daily.      . vitamin C (ASCORBIC ACID) 500 MG tablet Take 500 mg by mouth daily.       No current facility-administered medications for this visit.    Allergies-reviewed and updated No Known Allergies  Social History   Social History Narrative   Lives with autistic son (patient cares for him)   Divorced. 4 children. 7 grandkids. 2 greatgrandkids.       Retired from working at [employer] in [employer]      Hobbies: go to  gym 3 days a week, tv and sports   Objective  Objective:  There were no vitals taken for this visit. Gen: NAD, resting comfortably HEENT: Mucous membranes are moist. Oropharynx normal Neck: no thyromegaly CV: RRR no murmurs rubs or gallops Lungs: CTAB no crackles, wheeze, rhonchi Abdomen: soft/nontender/nondistended/normal bowel sounds. No rebound or guarding.  Ext: no edema Skin: warm, dry Neuro: grossly normal, moves all extremities, PERRLA ***   Assessment and Plan  87 y.o. male presenting for annual physical.  Health Maintenance counseling: 1. Anticipatory guidance: Patient counseled regarding regular dental exams ***q6 months, eye exams ***yearly,  avoiding smoking and second hand smoke*** , limiting alcohol to 2 beverages per day ***.   2. Risk factor reduction:  Advised patient of need for regular exercise and diet rich and fruits and vegetables to reduce risk of heart attack and stroke. Exercise- ***. Diet-***.  Wt Readings from Last 3 Encounters:  10/02/20 214 lb (97.1 kg)  05/26/20 208 lb (94.3 kg)  12/19/19 214 lb (97.1 kg)   3. Immunizations/screenings/ancillary studies Immunization History  Administered Date(s) Administered  . Fluad Quad(high Dose 65+) 06/12/2019  . Influenza Whole 07/27/2007, 08/27/2009, 06/14/2010  . Influenza, High Dose Seasonal PF 07/08/2016, 07/21/2017, 08/13/2018  . Influenza,inj,Quad PF,6+ Mos 06/23/2015  . Influenza-Unspecified 07/10/2013, 07/17/2014, 06/24/2020  . PFIZER(Purple Top)SARS-COV-2 Vaccination 01/17/2020, 02/11/2020  . Pneumococcal Conjugate-13 02/20/2015  . Pneumococcal Polysaccharide-23 10/24/2005  . Td 10/24/2005  . Tdap 08/13/2018   Health Maintenance Due  Topic Date Due  . COVID-19 Vaccine (3 - Pfizer risk 4-dose series) 03/10/2020   4. Prostate cancer screening- ***  Lab Results  Component Value Date   PSA 16.85 (H) 06/12/2019   PSA 17.10 06/01/2017   PSA 8.14 (H) 07/29/2010   5. Colon cancer screening - *** 6.  Skin cancer screening- ***advised regular sunscreen use. Denies worrisome, changing, or new skin lesions.  7. *** smoker 8. STD screening - ***  Status of chronic or acute concerns   ***05/27/2020 Iron levels are low-would recommend adding ferrous sulfate 325 mg over-the-counter twice a week to build the stores up.  Team please add this to medication list  #Emergency room follow-up S: Patient presented on July 25 with a nosebleed from both nostrils-he placed a cotton ball in both nostrils but had to change the cottonball several times.  Has had these in the past but typically they would stop other than did have a severe nosebleed March 2020 leading to hospitalization-with severe note there was concern for GI bleed no known trauma.  Emergency room PA recommended pressure and use of Afrin in the future-I discussed with patient my concern about Afrin raising his blood pressure.  No blood work was completed as he was nontoxic-appearing  Patient does take iron related to prior GI bleed***  A/P: ***   # Diabetes S: Medication:***NPH insulin 12 units in the morning and 6 units in the evening, Metformin 500 mg twice daily CBGs- *** Exercise and diet- ***   A/P: ***I would consider goal of a reasonable-update A1c with labs today-likely continue current medications   #hyperlipidemia/CAD with history of CABG S: Medication:*** Compliant with statin-lovastatin 40 mg and aspirin  Patient denies symptoms such as chest pain or shortness of breath.    A/P: ***   For hyperlipidemia-needs updated lipid panel-ordered today  #hypertension S: medication: ***Benazepril 40 mg Home readings #s: *** A/P: ***Poor control on initial check-was even more significantly increased in emergency room    #Prostate cancer-chronically elevated PSA-patient self caths 2-3 times a day.  Refused treatment in 2008 when discovered. *** Preventative health care  Recommended follow up: ***No follow-ups on file. Future  Appointments  Date Time Provider [place]  02/16/2021  1:20 PM [name], MD [facility]  03/15/2021  1:30 PM [facility] PHARMACIST [facility]    No chief complaint on file.  Lab/Order associations:*** fasting   ICD-10-CM   1. Preventative health care  Z00.00     No orders of the defined types were placed in this encounter.   Return precautions advised.  Clyde Lundborg, CMA

## 2021-02-16 ENCOUNTER — Encounter: Payer: Medicare HMO | Admitting: Family Medicine

## 2021-02-16 DIAGNOSIS — Z794 Long term (current) use of insulin: Secondary | ICD-10-CM

## 2021-02-16 DIAGNOSIS — E1169 Type 2 diabetes mellitus with other specified complication: Secondary | ICD-10-CM

## 2021-02-16 DIAGNOSIS — Z0000 Encounter for general adult medical examination without abnormal findings: Secondary | ICD-10-CM

## 2021-02-16 DIAGNOSIS — E1159 Type 2 diabetes mellitus with other circulatory complications: Secondary | ICD-10-CM

## 2021-03-10 ENCOUNTER — Encounter: Payer: Self-pay | Admitting: Family Medicine

## 2021-03-10 ENCOUNTER — Other Ambulatory Visit: Payer: Self-pay | Admitting: Family Medicine

## 2021-03-12 ENCOUNTER — Other Ambulatory Visit: Payer: Self-pay

## 2021-03-12 ENCOUNTER — Telehealth: Payer: Self-pay

## 2021-03-12 NOTE — Telephone Encounter (Signed)
Error

## 2021-03-12 NOTE — Telephone Encounter (Signed)
  LAST APPOINTMENT DATE: 10/02/2020   NEXT APPOINTMENT DATE:   MEDICATION:benazepril (LOTENSIN) 40 MG tablet  PHARMACY: [facility] ([place]), [place] - [address]  Comments: Patient has been out for several days, and the pharmacy states they have sent over a request for the medication multiple times and hasnt had any response. Wondering if this can be sent in today.   Please advise

## 2021-03-15 ENCOUNTER — Telehealth: Payer: Self-pay

## 2021-03-15 ENCOUNTER — Ambulatory Visit (INDEPENDENT_AMBULATORY_CARE_PROVIDER_SITE_OTHER): Payer: Medicare HMO

## 2021-03-15 DIAGNOSIS — E1159 Type 2 diabetes mellitus with other circulatory complications: Secondary | ICD-10-CM | POA: Diagnosis not present

## 2021-03-15 DIAGNOSIS — Z794 Long term (current) use of insulin: Secondary | ICD-10-CM

## 2021-03-15 DIAGNOSIS — I152 Hypertension secondary to endocrine disorders: Secondary | ICD-10-CM

## 2021-03-15 NOTE — Patient Instructions (Signed)
[name],  Thank you for talking with me today. I have included our care plan/goals in the following pages.   Please review and call me at [phone] with any questions.  Thanks! [name], Pharm.D., BCGP Clinical Pharmacist [name] [phone] Patient Care Plan: [name]    Problem Identified: HTN HLD DMII GERD Insomnia CAD   Priority: High    Long-Range Goal: Disease Management   Start Date: 03/15/2021  Expected End Date: 03/15/2022  This Visit's Progress: On track  Priority: High  Note:    Current Barriers:  . Does not maintain contact with provider office  Pharmacist Clinical Goal(s):  Marland Kitchen Patient will achieve adherence to monitoring guidelines and medication adherence to achieve therapeutic efficacy . contact provider office for questions/concerns as evidenced notation of same in electronic health record through collaboration with PharmD and provider.   Interventions: . 1:1 collaboration with [name] Olp, MD regarding development and update of comprehensive plan of care as evidenced by provider attestation and co-signature . Inter-disciplinary care team collaboration (see longitudinal plan of care) . Comprehensive medication review performed; medication list updated in electronic medical record . No Rx changes  Hypertension (BP goal <140/90) -Controlled -Current treatment: . Benazepril 40 mg once daily . HCTZ 25 mg once daily -Current home readings: checks but did not provide any actual readings today -Current exercise habits: rides stationary bike on/off -Denies hypotensive/hypertensive symptoms -Educated on Symptoms of hypotension and importance of maintaining adequate hydration; -Counseled to monitor BP at home once every 1-2 weeks, document, and provide log at future appointments -Recommended to continue current medication  Diabetes (A1c goal <8%) -Controlled -Current  medications: . Novolin 70/30 12 units into the skin each morning and 6 units into the skin each evening  . Metformin 500 mg twice daily  -Current home glucose readings: again reports normal readings but not able to provide specific numbers.  -Denies hypoglycemic/hyperglycemic symptoms -Current meal patterns: did not review -Educated on Prevention and management of hypoglycemic episodes; Benefits of routine self-monitoring of blood sugar; -Counseled to check feet daily and get yearly eye exams -Recommended to continue current medication   Patient Goals/Self-Care Activities . Patient will:  - target a minimum of 150 minutes of moderate intensity exercise weekly Needing to schedule CPE with PCP - aware of previous missed appt 01/2021.  Medication Assistance: None required.  Patient affirms current coverage meets needs. Patient's preferred pharmacy is:  [name], [name] Phone: [phone] Fax: [phone]  [name], [name] [name] Phone: [phone] Fax: [phone]  Follow Up:  Patient agrees to Care Plan and Follow-up. Follow Up Plan: 1 month CPA call to check back in with patient and hopefully help coordinate CPE scheduling     The patient verbalized understanding of instructions provided today - declined a copy of patient instruction and/or educational materials. Telephone follow up appointment with pharmacy team member scheduled for: See next appointment with "Care Management Staff" under "What's Next" below.

## 2021-03-15 NOTE — Telephone Encounter (Signed)
Please call [facility]. As a former patient there I am hoping they can help [place] work around the situation. Explain he is 58 and deaf and would appreciate if Guerry Minors can be a point of contact. You can place new referral under prostate cancer and hematuria. He self caths right now- would be great to get him in.

## 2021-03-15 NOTE — Telephone Encounter (Signed)
[name] and they can see pt at 9:45 on /May 31. I called [name] and unfortunately no answer and mailbox is full, will try to reach her again shortly.

## 2021-03-15 NOTE — Telephone Encounter (Signed)
Ok to make appt with you?

## 2021-03-15 NOTE — Telephone Encounter (Signed)
Patients niece Guerry Minors called in stating that she received a call from [facility] on Friday that his prostate cancer was acting up stating that he was seeing blood (didn't specify from where) she advised him to call his urologist. When Guerry Minors called him today to check in with him, he said he tried calling [facility] but couldn't get through to them and didn't want to bother [place] with his prostate issues. Wanting to know what they should do, doesn't have a DPR signed for [facility] so wouldn't be able to speak with anyone about his issues.

## 2021-03-15 NOTE — Progress Notes (Signed)
Chronic Care Management Pharmacy Note  03/15/2021 Name:  [name] MRN:  [id number] DOB:  02/13/1921  Subjective: [name] is an 87 y.o. year old male who is a primary patient of Hunter, Brayton Mars, MD.  The CCM team was consulted for assistance with disease management and care coordination needs.  Patient has extreme difficulty with hearing and uses tty   Engaged with patient by telephone for follow up visit in response to provider referral for pharmacy case management and/or care coordination services.   Consent to Services:   The patient was given information about Chronic Care Management services, agreed to services, and gave verbal consent prior to initiation of services.  Please see initial visit note for detailed documentation.   Patient Care Team: [name], MD as PCP - General (Family Medicine) Buford Dresser, MD as PCP - Cardiology (Cardiology) Madelin Rear, [facility] as Pharmacist (Pharmacist)  Objective:  Lab Results  Component Value Date   CREATININE 1.40 (H) 10/02/2020   CREATININE 1.36 (H) 08/10/2020   CREATININE 1.31 (H) 05/26/2020    Lab Results  Component Value Date   HGBA1C 7.3 (H) 10/02/2020   Last diabetic Eye exam:  Lab Results  Component Value Date/Time   HMDIABEYEEXA No Retinopathy 11/20/2020 12:00 AM    Last diabetic Foot exam:  Lab Results  Component Value Date/Time   HMDIABFOOTEX done 07/29/2010 12:00 AM        Component Value Date/Time   CHOL 153 05/26/2020 0959   TRIG 131 05/26/2020 0959   HDL 46 05/26/2020 0959   CHOLHDL 3.3 05/26/2020 0959   VLDL 32.6 08/13/2018 1001   LDLCALC 84 05/26/2020 0959   LDLDIRECT 53 10/02/2020 1228    Hepatic Function Latest Ref Rng & Units 10/02/2020 08/10/2020 05/26/2020  Total Protein 6.1 - 8.1 g/dL 6.6 6.9 6.7  Albumin 3.5 - 5.0 g/dL - 3.6 -  AST 10 - 35 U/L _0 ALT 9 - 46 U/L 11 12 6(L)  Alk Phosphatase 38 - 126 U/L - 39 -  Total Bilirubin 0.2 - 1.2 mg/dL 0.4  0.7 0.5  Bilirubin, Direct 0.0 - 0.3 mg/dL - - -    Lab Results  Component Value Date/Time   TSH 1.98 06/12/2019 09:29 AM   TSH 0.97 07/29/2010 12:00 PM    CBC Latest Ref Rng & Units 10/02/2020 08/10/2020 05/26/2020  WBC 3.8 - 10.8 Thousand/uL 7.4 10.7(H) 7.9  Hemoglobin 13.2 - 17.1 g/dL 8.3(L) 9.4(L) 10.5(L)  Hematocrit 38.5 - 50.0 % 27.0(L) 30.1(L) 33.0(L)  Platelets 140 - 400 Thousand/uL 465(H) 434(H) 399    No results found for: VD25OH  Clinical ASCVD:  The ASCVD Risk score Mikey Bussing DC Jr., et al., 2013) failed to calculate for the following reasons:   The 2013 ASCVD risk score is only valid for ages 44 to 70     Social History   Tobacco Use  Smoking Status Former Smoker  . Quit date: 10/25/1948  . Years since quitting: 72.4  Smokeless Tobacco Never Used   BP Readings from Last 3 Encounters:  10/02/20 138/72  08/10/20 (!) 159/74  05/26/20 136/72   Pulse Readings from Last 3 Encounters:  10/02/20 85  08/10/20 89  05/26/20 82   Wt Readings from Last 3 Encounters:  10/02/20 214 lb (97.1 kg)  05/26/20 208 lb (94.3 kg)  12/19/19 214 lb (97.1 kg)    Assessment: Review of patient past medical history, allergies, medications, health status, including review of consultants  reports, laboratory and other test data, was performed as part of comprehensive evaluation and provision of chronic care management services.   SDOH:  (Social Determinants of Health) assessments and interventions performed: Yes.   CCM Care Plan  No Known Allergies  Medications Reviewed Today    Reviewed by Madelin Rear, [facility] (Pharmacist) on 03/15/21 at 1346  Med List Status: <None>  Medication Order Taking? Sig Documenting Provider Last Dose Status Informant  ACCU-CHEK AVIVA PLUS test strip [id number]  TEST BLOOD SUGAR TWICE DAILY [name], MD  Active   ACCU-CHEK SOFTCLIX LANCETS lancets [id number] No USE  TO  TEST TWICE DAILY [name], MD Taking Active Self  Alcohol Swabs (B-D  SINGLE USE SWABS REGULAR) PADS [id number] No  [provider] Taking Active Self  benazepril (LOTENSIN) 40 MG tablet [id number]  Take 1 tablet by mouth once daily [name], MD  Active   ferrous sulfate 325 (65 FE) MG tablet [id number]  Take 1 tablet (325 mg total) by mouth 2 (two) times a week. [name], MD  Active   hydrochlorothiazide (HYDRODIURIL) 25 MG tablet [id number] No Take 1 tablet by mouth once daily [name], MD Taking Active   insulin NPH-regular Human (NOVOLIN 70/30) (70-30) 100 UNIT/ML injection [id number]  Inject 12 units into the skin each morning and 6 units into the skin each evening. [name], MD  Active   Insulin Pen Needle (BD PEN NEEDLE NANO U/F) 32G X 4 MM MISC [id number] No USE TWICE DAILY. DX: E11.9 [name], MD Taking Active Self  Insulin Syringe-Needle U-100 (MONOJECT INS SYR 1CC/25G) 25G X 5/8" 1 ML MISC [id number] No USE 1  TWICE DAILY AS NEEDED FOR  RELION  INSULIN  70/30 [name], MD Taking Active   metFORMIN (GLUCOPHAGE) 500 MG tablet [id number]  TAKE 1 TABLET TWICE DAILY WITH MEALS [name], MD  Active   pantoprazole (PROTONIX) 40 MG tablet [id number] No Take 1 tablet (40 mg total) by mouth daily at 6 (six) AM.  Patient not taking: Reported on 08/10/2020   Niel Hummer A, MD Not Taking Unknown time Active   rosuvastatin (CRESTOR) 10 MG tablet [id number] No Take 1 tablet (10 mg total) by mouth daily. [name], MD 08/09/2020 Unknown time Active   sulfamethoxazole-trimethoprim (BACTRIM DS) 800-160 MG tablet [id number]  Take 1 tablet by mouth 2 (two) times daily. 7 day supply [provider]  Active            Med Note Vita Barley Aug 10, 2020 10:07 AM) Pt unsure of this med, I called pharmacy & he has not picked it up yet  tamsulosin (FLOMAX) 0.4 MG CAPS capsule [id number] No Take 1 capsule (0.4 mg total) by mouth daily.  Patient not taking: Reported on 08/10/2020   [name], MD Not Taking Unknown time Active   vitamin B-12 (CYANOCOBALAMIN) 100 MCG tablet [id number] No Take 50 mcg by mouth daily.   [provider] 08/09/2020 Unknown time Active Self  vitamin C (ASCORBIC ACID) 500 MG tablet [id number] No Take 500 mg by mouth daily.   [provider] 08/09/2020 Unknown time Active Self          Patient Active Problem List   Diagnosis Date Noted  . Hematemesis 01/01/2019  . Cellulitis of left eyelid 01/01/2019  . Heme positive stool   . Epistaxis, recurrent 12/31/2018  . Insomnia 07/06/2017  . Diastolic dysfunction  06/23/2015  . Constipation 06/16/2010  . GERD 08/04/2009  . Prostate cancer ([place]) 08/21/2008  . Umbilical hernia 16/07/9603  . Hiatal hernia 02/13/2008  . DEGENERATIVE DISC DISEASE, CERVICAL SPINE 02/13/2008  . Diabetes mellitus type II, controlled ([place]) 06/04/2007  . Hyperlipidemia associated with type 2 diabetes mellitus ([place]) 06/04/2007  . Hypertension associated with diabetes ([place]) 06/04/2007  . CAD (coronary artery disease) 06/04/2007    Immunization History  Administered Date(s) Administered  . Fluad Quad(high Dose 65+) 06/12/2019  . Influenza Whole 07/27/2007, 08/27/2009, 06/14/2010  . Influenza, High Dose Seasonal PF 07/08/2016, 07/21/2017, 08/13/2018  . Influenza,inj,Quad PF,6+ Mos 06/23/2015  . Influenza-Unspecified 07/10/2013, 07/17/2014, 06/24/2020  . PFIZER(Purple Top)SARS-COV-2 Vaccination 01/17/2020, 02/11/2020  . Pneumococcal Conjugate-13 02/20/2015  . Pneumococcal Polysaccharide-23 10/24/2005  . Td 10/24/2005  . Tdap 08/13/2018   Conditions to be addressed/monitored: HTN HLD DMII GERD Insomnia CAD   Care Plan : [place]  Updates made by Madelin Rear, [facility] since 03/15/2021 12:00 AM    Problem: HTN HLD DMII GERD Insomnia CAD   Priority: High    Long-Range Goal: Disease Management   Start Date: 03/15/2021  Expected End Date: 03/15/2022  This Visit's Progress: On track  Priority:  High  Note:    Current Barriers:  . Does not maintain contact with provider office  Pharmacist Clinical Goal(s):  Marland Kitchen Patient will achieve adherence to monitoring guidelines and medication adherence to achieve therapeutic efficacy . contact provider office for questions/concerns as evidenced notation of same in electronic health record through collaboration with PharmD and provider.   Interventions: . 1:1 collaboration with [name], MD regarding development and update of comprehensive plan of care as evidenced by provider attestation and co-signature . Inter-disciplinary care team collaboration (see longitudinal plan of care) . Comprehensive medication review performed; medication list updated in electronic medical record . No Rx changes  Hypertension (BP goal <140/90) -Controlled -Current treatment: . Benazepril 40 mg once daily . HCTZ 25 mg once daily -Current home readings: checks but did not provide any actual readings today -Current exercise habits: rides stationary bike on/off -Denies hypotensive/hypertensive symptoms -Educated on Symptoms of hypotension and importance of maintaining adequate hydration; -Counseled to monitor BP at home once every 1-2 weeks, document, and provide log at future appointments -Recommended to continue current medication  Diabetes (A1c goal <8%) -Controlled -Current medications: . Novolin 70/30 12 units into the skin each morning and 6 units into the skin each evening  . Metformin 500 mg twice daily  -Current home glucose readings: again reports normal readings but not able to provide specific numbers.  -Denies hypoglycemic/hyperglycemic symptoms -Current meal patterns: did not review -Educated on Prevention and management of hypoglycemic episodes; Benefits of routine self-monitoring of blood sugar; -Counseled to check feet daily and get yearly eye exams -Recommended to continue current medication   Patient Goals/Self-Care  Activities . Patient will:  - target a minimum of 150 minutes of moderate intensity exercise weekly Needing to schedule CPE with PCP - aware of previous missed appt 01/2021.  Medication Assistance: None required.  Patient affirms current coverage meets needs. Patient's preferred pharmacy is:  [facility], [place] [address] Phone: [phone] Fax: [phone]  [place] ([place]), [name] [place] ([place]) [place] [address] Phone: [phone] Fax: [phone]  Follow Up:  Patient agrees to Care Plan and Follow-up. Follow Up Plan: 1  month CPA call to check back in with patient and hopefully help coordinate CPE scheduling    Future Appointments  Date Time Provider [place]  06/16/2021  9:00 AM [facility] [place], PharmD, CPP Clinical Pharmacist Practitioner  [facility] [phone]

## 2021-03-16 NOTE — Telephone Encounter (Signed)
Reached out to [name] again and was unsuccessful not able to leave voicemail, a letter has been mailed to pt to make aware that [facility].

## 2021-03-17 ENCOUNTER — Other Ambulatory Visit: Payer: Self-pay | Admitting: Family Medicine

## 2021-03-23 DIAGNOSIS — R339 Retention of urine, unspecified: Secondary | ICD-10-CM | POA: Diagnosis not present

## 2021-03-23 DIAGNOSIS — R310 Gross hematuria: Secondary | ICD-10-CM | POA: Diagnosis not present

## 2021-03-25 ENCOUNTER — Other Ambulatory Visit: Payer: Self-pay

## 2021-03-29 ENCOUNTER — Telehealth: Payer: Self-pay

## 2021-03-29 NOTE — Chronic Care Management (AMB) (Signed)
Chronic Care Management Pharmacy Assistant   Name: [name]  MRN: [id number] DOB: 08-Aug-1921  Reason for Encounter: Diabetes Mellitus Disease State Call  Recent office visits:  No visits noted  Recent consult visits:  No visits noted  Hospital visits:  None in previous 6 months  Medications: Outpatient Encounter Medications as of 03/29/2021  Medication Sig Note   ACCU-CHEK AVIVA PLUS test strip TEST BLOOD SUGAR TWICE DAILY    ACCU-CHEK SOFTCLIX LANCETS lancets USE  TO  TEST TWICE DAILY    Alcohol Swabs (B-D SINGLE USE SWABS REGULAR) PADS     benazepril (LOTENSIN) 40 MG tablet Take 1 tablet by mouth once daily    ferrous sulfate 325 (65 FE) MG tablet Take 1 tablet (325 mg total) by mouth 2 (two) times a week.    hydrochlorothiazide (HYDRODIURIL) 25 MG tablet Take 1 tablet by mouth once daily    insulin NPH-regular Human (NOVOLIN 70/30) (70-30) 100 UNIT/ML injection Inject 12 units into the skin each morning and 6 units into the skin each evening.    Insulin Pen Needle (BD PEN NEEDLE NANO U/F) 32G X 4 MM MISC USE TWICE DAILY. DX: E11.9    Insulin Syringe-Needle U-100 (MONOJECT INS SYR 1CC/25G) 25G X 5/8" 1 ML MISC USE 1  TWICE DAILY AS NEEDED FOR  RELION  INSULIN  70/30    metFORMIN (GLUCOPHAGE) 500 MG tablet TAKE 1 TABLET TWICE DAILY WITH MEALS    pantoprazole (PROTONIX) 40 MG tablet Take 1 tablet (40 mg total) by mouth daily at 6 (six) AM. (Patient not taking: Reported on 08/10/2020)    rosuvastatin (CRESTOR) 10 MG tablet Take 1 tablet (10 mg total) by mouth daily.    sulfamethoxazole-trimethoprim (BACTRIM DS) 800-160 MG tablet Take 1 tablet by mouth 2 (two) times daily. 7 day supply 08/10/2020: Pt unsure of this med, I called pharmacy & he has not picked it up yet   tamsulosin (FLOMAX) 0.4 MG CAPS capsule Take 1 capsule (0.4 mg total) by mouth daily. (Patient not taking: Reported on 08/10/2020)    vitamin B-12 (CYANOCOBALAMIN) 100 MCG tablet Take 50 mcg by mouth daily.       vitamin C (ASCORBIC ACID) 500 MG tablet Take 500 mg by mouth daily.      No facility-administered encounter medications on file as of 03/29/2021.    Recent Relevant Labs: Lab Results  Component Value Date/Time   HGBA1C 7.3 (H) 10/02/2020 12:28 PM   HGBA1C 7.4 (H) 05/26/2020 09:59 AM   MICROALBUR 0.7 06/05/2007 09:57 AM    Kidney Function Lab Results  Component Value Date/Time   CREATININE 1.40 (H) 10/02/2020 12:28 PM   CREATININE 1.36 (H) 08/10/2020 08:35 AM   CREATININE 1.31 (H) 05/26/2020 09:59 AM   GFR 60.76 07/29/2019 10:21 AM   GFRNONAA 42 (L) 10/02/2020 12:28 PM   GFRAA 48 (L) 10/02/2020 12:28 PM   I spoke with [name] and he is doing well. He had no complaints or concerns for his health. He reports checking his BS regularly, but does not keep a log. Because of this he took his BS after our call and gave me a reading of 107, which was an unusual number for him. I assured him that even though it is not his usual range, a BS of 107 is not cause for immediate intervention. He is aware that if he begins to develop hypoglycemic symptoms and a drop in his BS level, he should contact his care team for evaluation.  We were able to schedule his physical at [facility]  in office for 09/27/21 at 2:40 pm.   Current antihyperglycemic regimen:  Novolin 70/30 12 units into the skin each morning and 6 units into the skin each evening  Metformin 500 mg twice daily   What recent interventions/DTPs have been made to improve glycemic control:  No recent interventions  Have there been any recent hospitalizations or ED visits since last visit with CPP? No  Patient denies hypoglycemic symptoms Patient denies hyperglycemic symptoms  How often are you checking your blood sugar?  Patient stated he checks his BS regularly  What are your blood sugars ranging?  Fasting: n.a Before meals: n.a After meals: 107 Bedtime: n.a  During the week, how often does your blood glucose drop below 70?  Never  Are you checking your feet daily/regularly?  Patient stated his feet look good   Adherence Review: Is the patient currently on a STATIN medication? Yes Is the patient currently on ACE/ARB medication? Yes Does the patient have >5 day gap between last estimated fill dates? Yes Novolin 70/30 107 Metformin 500 mg-90 DS last filled 02/08/21  Star Rating Drugs: Rosuvastatin 10 mg-  Benazepril 40 mg- 90 DS last filled 12/08/20  [facility] CPA, CMA

## 2021-04-01 ENCOUNTER — Other Ambulatory Visit: Payer: Self-pay

## 2021-04-01 ENCOUNTER — Telehealth: Payer: Self-pay

## 2021-04-01 MED ORDER — ACCU-CHEK AVIVA PLUS W/DEVICE KIT: PACK | 3 refills | Status: AC

## 2021-04-01 NOTE — Telephone Encounter (Signed)
Patient called in frustrated because [facility] sent a request for a glucose monitor but has not had a response and [place] would like a call back.

## 2021-04-01 NOTE — Telephone Encounter (Signed)
Called and lm for pt tcb. We have not gotten anything from [facility], however, I have sent in a meter for him to [facility].

## 2021-04-05 ENCOUNTER — Other Ambulatory Visit: Payer: Self-pay | Admitting: *Deleted

## 2021-04-05 MED ORDER — ACCU-CHEK AVIVA PLUS VI STRP: ORAL_STRIP | 0 refills | Status: AC

## 2021-04-05 MED ORDER — ACCU-CHEK SOFTCLIX LANCETS MISC: 3 refills | Status: AC

## 2021-04-21 ENCOUNTER — Telehealth: Payer: Self-pay

## 2021-04-21 NOTE — Progress Notes (Signed)
    Chronic Care Management Pharmacy Assistant   Name: [name]  MRN: [id number] DOB: 27-Apr-1921   Medications: Outpatient Encounter Medications as of 04/21/2021  Medication Sig Note   Accu-Chek Softclix Lancets lancets USE  TO  TEST TWICE DAILY    Alcohol Swabs (B-D SINGLE USE SWABS REGULAR) PADS     benazepril (LOTENSIN) 40 MG tablet Take 1 tablet by mouth once daily    Blood Glucose Monitoring Suppl (ACCU-CHEK AVIVA PLUS) w/Device KIT Use to check blood sugars daily. Dx: E11.9    ferrous sulfate 325 (65 FE) MG tablet Take 1 tablet (325 mg total) by mouth 2 (two) times a week.    glucose blood (ACCU-CHEK AVIVA PLUS) test strip TEST BLOOD SUGAR TWICE DAILY    hydrochlorothiazide (HYDRODIURIL) 25 MG tablet Take 1 tablet by mouth once daily    insulin NPH-regular Human (NOVOLIN 70/30) (70-30) 100 UNIT/ML injection Inject 12 units into the skin each morning and 6 units into the skin each evening.    Insulin Pen Needle (BD PEN NEEDLE NANO U/F) 32G X 4 MM MISC USE TWICE DAILY. DX: E11.9    Insulin Syringe-Needle U-100 (MONOJECT INS SYR 1CC/25G) 25G X 5/8" 1 ML MISC USE 1  TWICE DAILY AS NEEDED FOR  RELION  INSULIN  70/30    metFORMIN (GLUCOPHAGE) 500 MG tablet TAKE 1 TABLET TWICE DAILY WITH MEALS    pantoprazole (PROTONIX) 40 MG tablet Take 1 tablet (40 mg total) by mouth daily at 6 (six) AM. (Patient not taking: Reported on 08/10/2020)    rosuvastatin (CRESTOR) 10 MG tablet Take 1 tablet (10 mg total) by mouth daily.    sulfamethoxazole-trimethoprim (BACTRIM DS) 800-160 MG tablet Take 1 tablet by mouth 2 (two) times daily. 7 day supply 08/10/2020: Pt unsure of this med, I called pharmacy & he has not picked it up yet   tamsulosin (FLOMAX) 0.4 MG CAPS capsule Take 1 capsule (0.4 mg total) by mouth daily. (Patient not taking: Reported on 08/10/2020)    vitamin B-12 (CYANOCOBALAMIN) 100 MCG tablet Take 50 mcg by mouth daily.      vitamin C (ASCORBIC ACID) 500 MG tablet Take 500 mg by mouth  daily.      No facility-administered encounter medications on file as of 04/21/2021.    Reviewed chart for medication changes and adherence.  No OVs, Consults, or hospital visits since last care coordination call / Pharmacist visit. No medication changes indicated  No gaps in adherence identified. Patient has follow up scheduled with pharmacy team. No further action required.   [place]

## 2021-06-01 ENCOUNTER — Telehealth: Payer: Self-pay | Admitting: Pharmacist

## 2021-06-01 NOTE — Chronic Care Management (AMB) (Addendum)
Chronic Care Management Pharmacy Assistant   Name: [name]  MRN: [id number] DOB: Jan 02, 1921   Reason for Encounter: Diabetes Adherence Call    Recent office visits:  None  Recent consult visits:  None  Hospital visits:  None in previous 6 months  Medications: Outpatient Encounter Medications as of 06/01/2021  Medication Sig Note   Accu-Chek Softclix Lancets lancets USE  TO  TEST TWICE DAILY    Alcohol Swabs (B-D SINGLE USE SWABS REGULAR) PADS     benazepril (LOTENSIN) 40 MG tablet Take 1 tablet by mouth once daily    Blood Glucose Monitoring Suppl (ACCU-CHEK AVIVA PLUS) w/Device KIT Use to check blood sugars daily. Dx: E11.9    ferrous sulfate 325 (65 FE) MG tablet Take 1 tablet (325 mg total) by mouth 2 (two) times a week.    glucose blood (ACCU-CHEK AVIVA PLUS) test strip TEST BLOOD SUGAR TWICE DAILY    hydrochlorothiazide (HYDRODIURIL) 25 MG tablet Take 1 tablet by mouth once daily    insulin NPH-regular Human (NOVOLIN 70/30) (70-30) 100 UNIT/ML injection Inject 12 units into the skin each morning and 6 units into the skin each evening.    Insulin Pen Needle (BD PEN NEEDLE NANO U/F) 32G X 4 MM MISC USE TWICE DAILY. DX: E11.9    Insulin Syringe-Needle U-100 (MONOJECT INS SYR 1CC/25G) 25G X 5/8" 1 ML MISC USE 1  TWICE DAILY AS NEEDED FOR  RELION  INSULIN  70/30    metFORMIN (GLUCOPHAGE) 500 MG tablet TAKE 1 TABLET TWICE DAILY WITH MEALS    pantoprazole (PROTONIX) 40 MG tablet Take 1 tablet (40 mg total) by mouth daily at 6 (six) AM. (Patient not taking: Reported on 08/10/2020)    rosuvastatin (CRESTOR) 10 MG tablet Take 1 tablet (10 mg total) by mouth daily.    sulfamethoxazole-trimethoprim (BACTRIM DS) 800-160 MG tablet Take 1 tablet by mouth 2 (two) times daily. 7 day supply 08/10/2020: Pt unsure of this med, I called pharmacy & he has not picked it up yet   tamsulosin (FLOMAX) 0.4 MG CAPS capsule Take 1 capsule (0.4 mg total) by mouth daily. (Patient not taking:  Reported on 08/10/2020)    vitamin B-12 (CYANOCOBALAMIN) 100 MCG tablet Take 50 mcg by mouth daily.      vitamin C (ASCORBIC ACID) 500 MG tablet Take 500 mg by mouth daily.      No facility-administered encounter medications on file as of 06/01/2021.   Recent Relevant Labs: Lab Results  Component Value Date/Time   HGBA1C 7.3 (H) 10/02/2020 12:28 PM   HGBA1C 7.4 (H) 05/26/2020 09:59 AM   MICROALBUR 0.7 06/05/2007 09:57 AM    Kidney Function Lab Results  Component Value Date/Time   CREATININE 1.40 (H) 10/02/2020 12:28 PM   CREATININE 1.36 (H) 08/10/2020 08:35 AM   CREATININE 1.31 (H) 05/26/2020 09:59 AM   GFR 60.76 07/29/2019 10:21 AM   GFRNONAA 42 (L) 10/02/2020 12:28 PM   GFRAA 48 (L) 10/02/2020 12:28 PM    Current antihyperglycemic regimen:  Metformin 500 mg twice daily Novolin 70/30, 12 units am 6 units pm  What recent interventions/DTPs have been made to improve glycemic control:  None  Have there been any recent hospitalizations or ED visits since last visit with CPP? No  Patient denies hypoglycemic symptoms.  Patient denies hyperglycemic symptoms.  How often are you checking your blood sugar? once daily  What are your blood sugars ranging?  Fasting: 188 Before meals: n/a After meals: n/a Bedtime:  n/a  During the week, how often does your blood glucose drop below 70? Never  Are you checking your feet daily/regularly?   Adherence Review: Is the patient currently on a STATIN medication? No Is the patient currently on ACE/ARB medication? Yes Does the patient have >5 day gap between last estimated fill dates? No  Patient states he doesn't have much of an appetite so he has to make himself eat at times. Patient states he doesn't know why his sugars are high at times as he doesn't eat much. Patient states he has been taking his medications as directed. He states he feels fine.  Future Appointments  Date Time Provider [place]  07/13/2021  9:30 AM [facility]  CCM PHARMACIST [facility]  09/27/2021  2:40 PM [name], MD [facility]      Star Rating Drugs: Benazepril last filled 03/14/2021 Metformin last filled 02/08/2021  April D Calhoun, [place] Pharmacist Assistant [phone]  10 minutes spent in review, coordination, and documentation.  Reviewed by: Beverly Milch, PharmD Clinical Pharmacist [phone]

## 2021-06-04 ENCOUNTER — Telehealth: Payer: Self-pay

## 2021-06-16 ENCOUNTER — Telehealth: Payer: Medicare HMO

## 2021-06-24 DIAGNOSIS — 419620001 Death: Secondary | SNOMED CT | POA: Diagnosis not present

## 2021-06-24 NOTE — Telephone Encounter (Signed)
Noted thanks °

## 2021-06-24 NOTE — Telephone Encounter (Signed)
EMS called this morning to advise [place] that [name] is no deceased. Mr. Syvertson was DOA when EMS showed up, EMS made it very clear that his death was due to natural causes and no suspicious activities was involved. Dr Yong Channel is aware and did give a verbal to signing the death certificate.

## 2021-06-24 DEATH — deceased

## 2021-07-13 ENCOUNTER — Telehealth: Payer: Medicare HMO

## 2021-09-27 ENCOUNTER — Encounter: Payer: Medicare HMO | Admitting: Family Medicine

## 2022-06-04 IMAGING — DX DG CHEST 1V
1 series · 1 of 1 positions shown · non-contrast
Comparison: 12/19/2019

CLINICAL DATA: Weakness and dizziness with fall.

EXAM:
CHEST  1 VIEW

[chest ap]
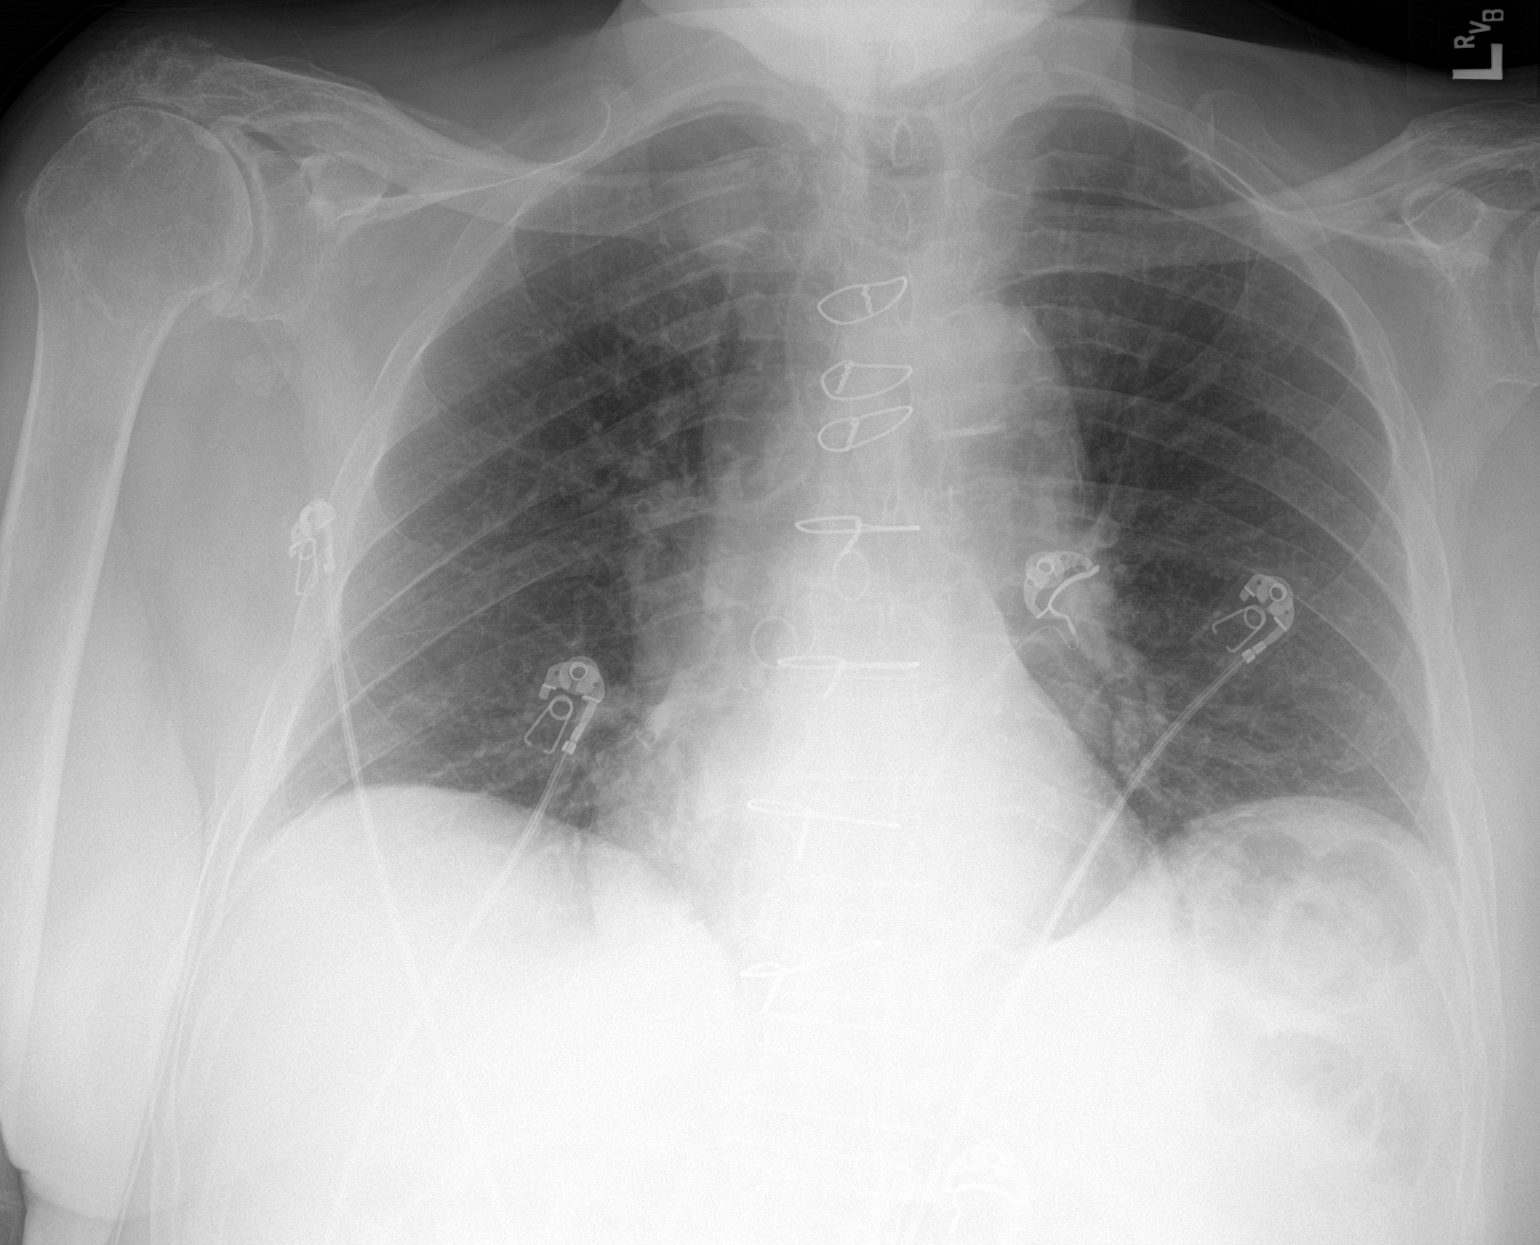

[1 of 1 positions shown; findings below may reference images not displayed]

FINDINGS: Previous median sternotomy and CABG. Aortic atherosclerotic
calcification. Heart size is normal. The lungs are clear. The
vascularity is normal. No acute bone finding. Chronic degenerative
changes of the right shoulder including a loose body.
IMPRESSION: No active disease. Previous CABG. Aortic atherosclerotic
calcification.

## 2022-06-04 IMAGING — DX DG PELVIS 1-2V
1 series · 3 of 3 positions shown · non-contrast
Comparison: None.

CLINICAL DATA: Fell today.

EXAM:
PELVIS - 1-2 VIEW

[Series 1: pelvis ap · 0.14mm/px · 3 of 3 slices shown]
[im 1/3]
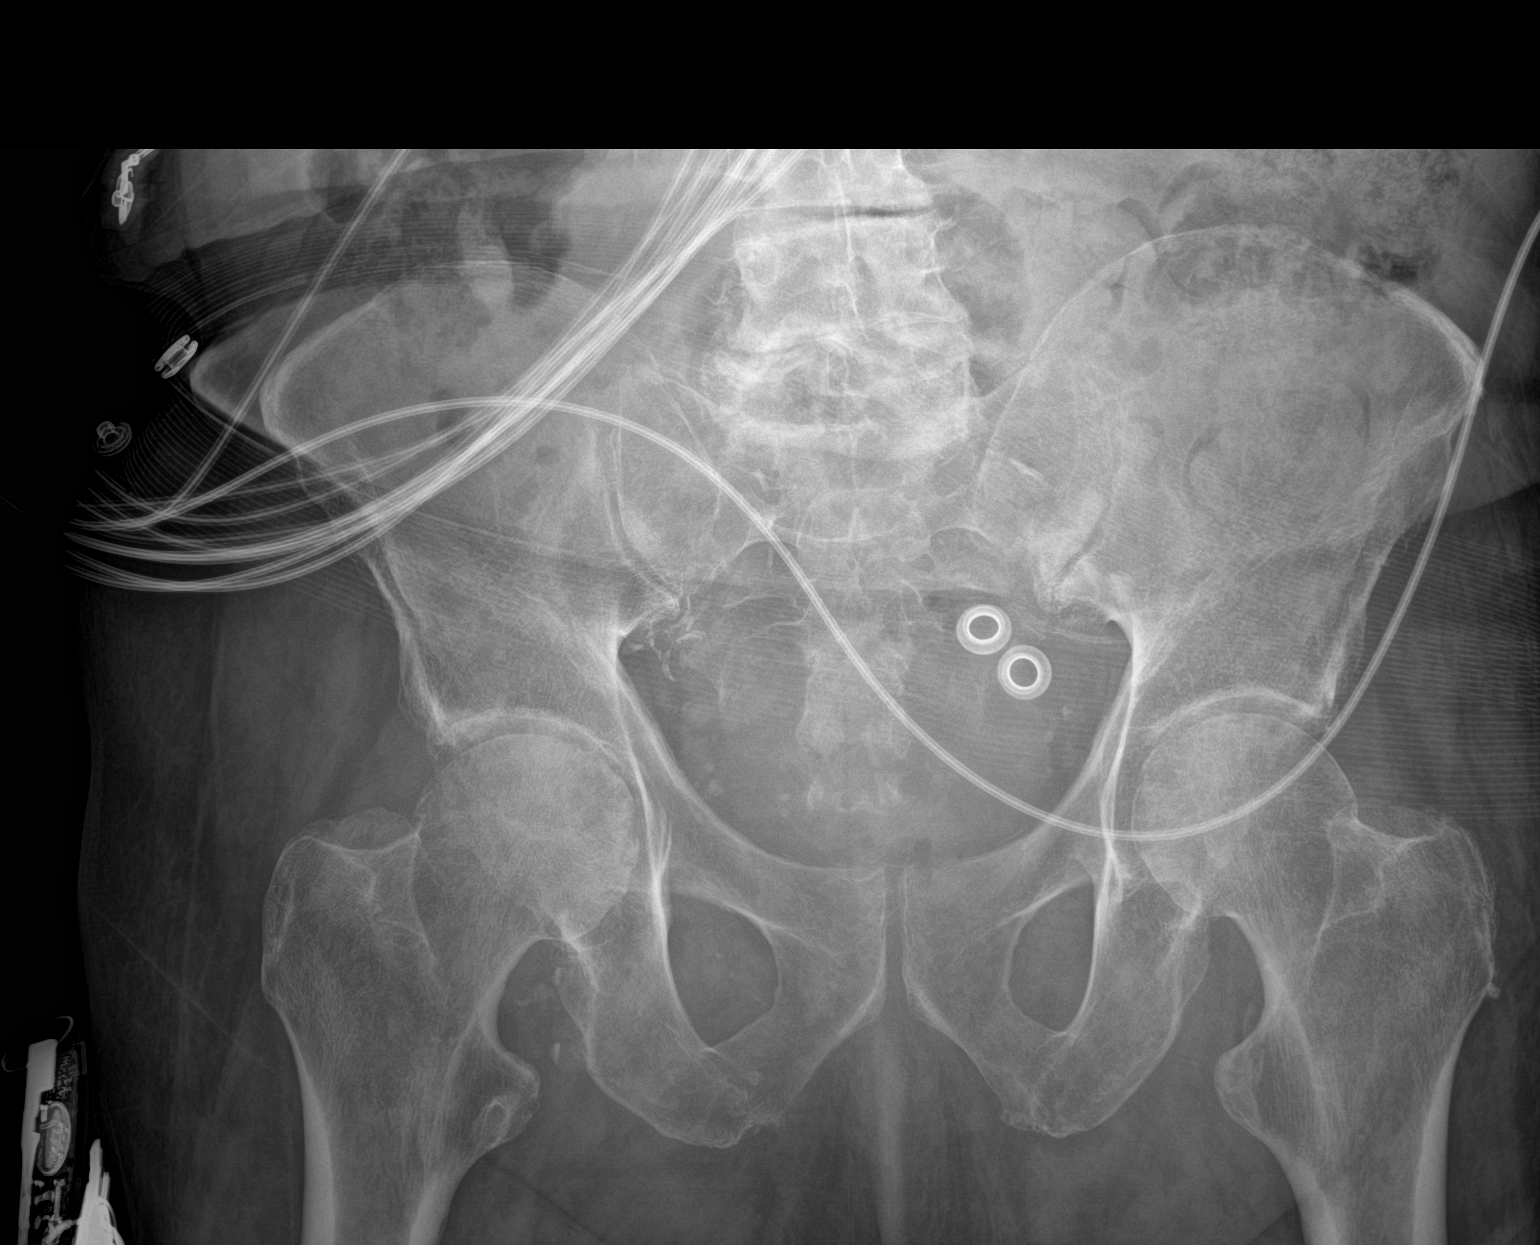
[im 2/3]
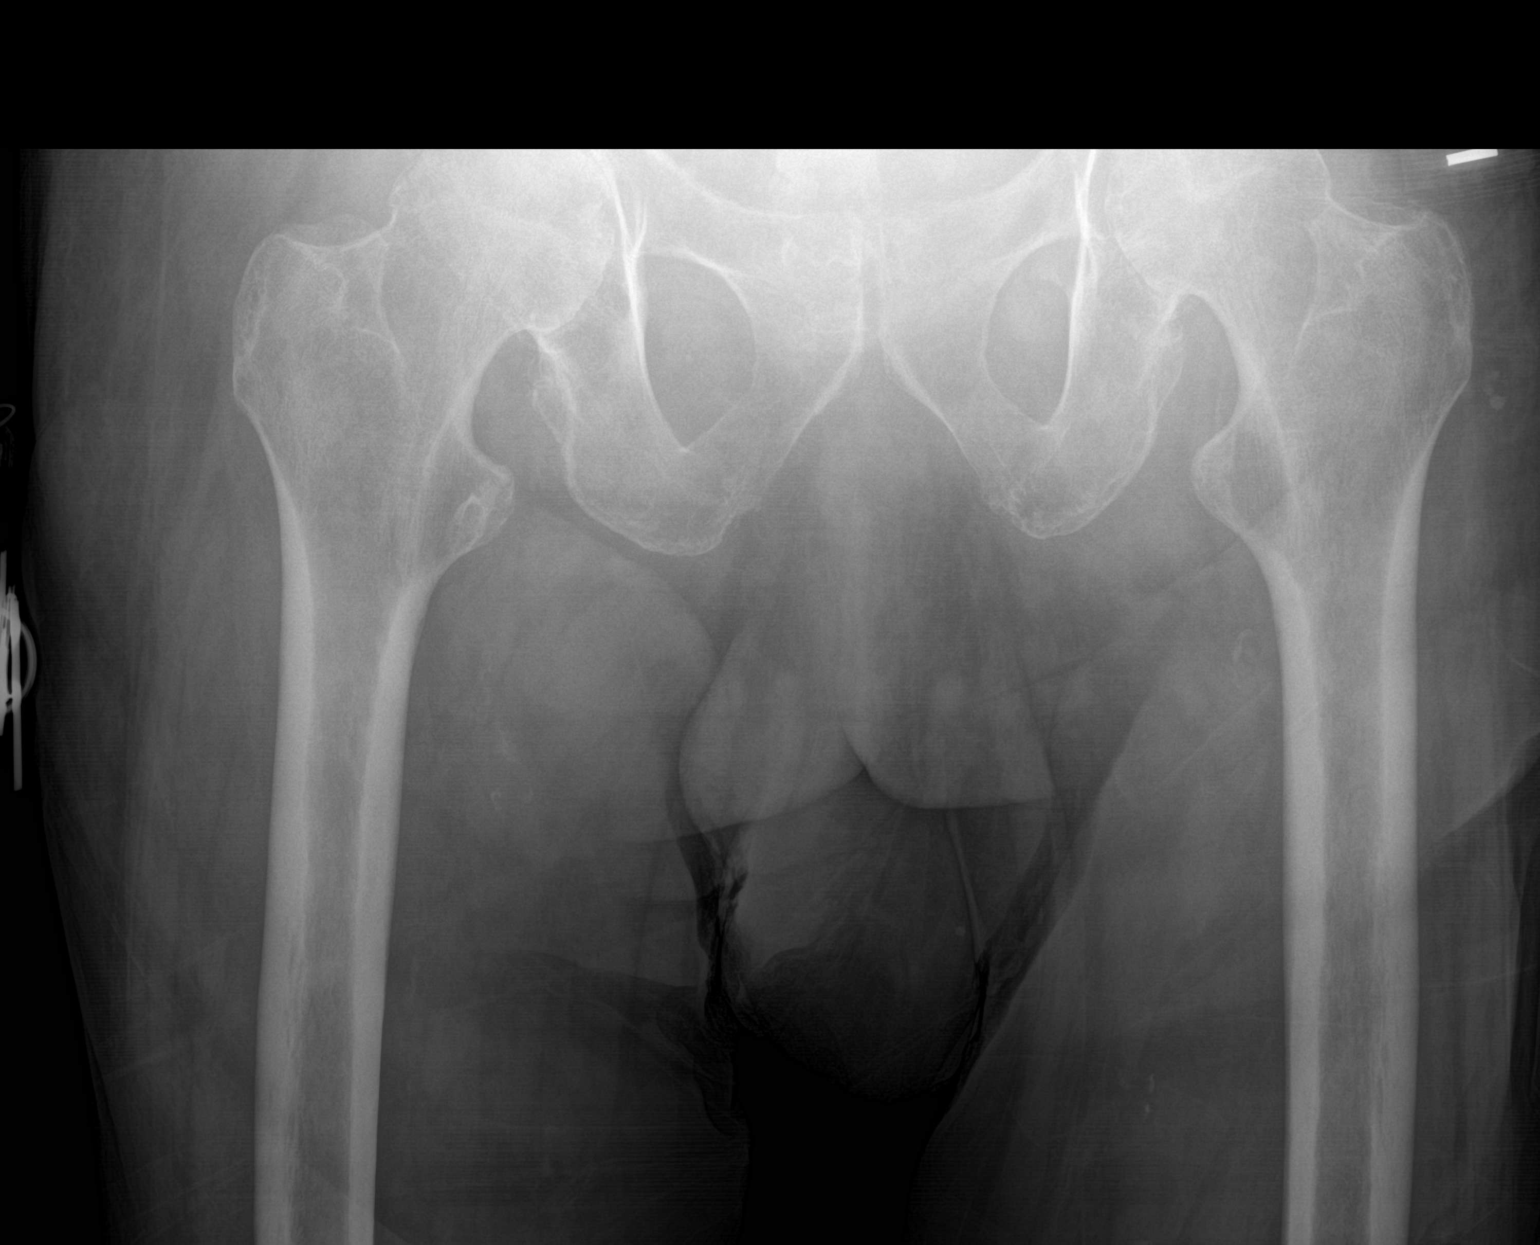
[im 3/3]
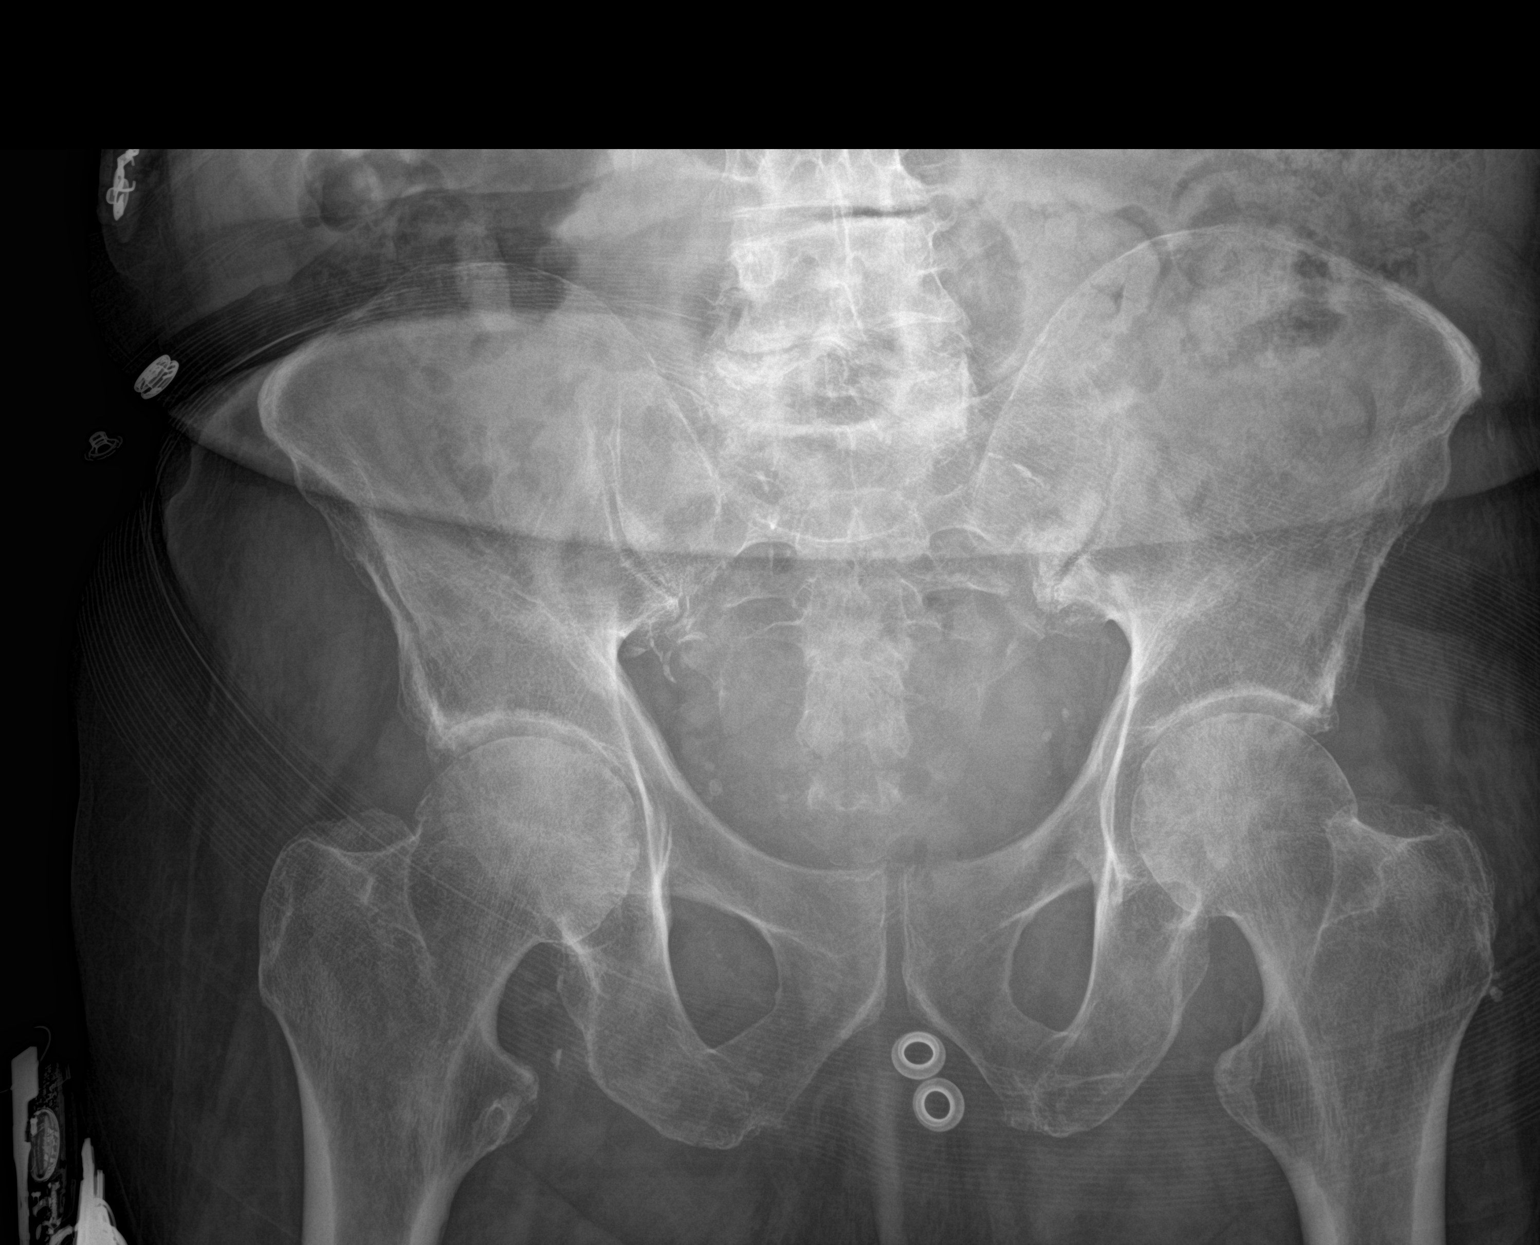

[3 of 3 positions shown; findings below may reference images not displayed]

FINDINGS: There is no evidence of pelvic fracture or diastasis. No pelvic bone
lesions are seen. Ordinary mild degenerative change of the hips,
sacroiliac joints and lower lumbar spine.
IMPRESSION: Negative.
